# Patient Record
Sex: Male | Born: 1950
Health system: Southern US, Community
[De-identification: ages and names within clinical notes are randomized; demographics above are authoritative.]

## PROBLEM LIST (undated history)

## (undated) DIAGNOSIS — I10 Essential (primary) hypertension: Secondary | ICD-10-CM

## (undated) DIAGNOSIS — M51369 Other intervertebral disc degeneration, lumbar region without mention of lumbar back pain or lower extremity pain: Secondary | ICD-10-CM

## (undated) DIAGNOSIS — U071 COVID-19: Secondary | ICD-10-CM

## (undated) DIAGNOSIS — R519 Headache, unspecified: Secondary | ICD-10-CM

## (undated) DIAGNOSIS — H269 Unspecified cataract: Secondary | ICD-10-CM

## (undated) DIAGNOSIS — J869 Pyothorax without fistula: Secondary | ICD-10-CM

## (undated) DIAGNOSIS — C801 Malignant (primary) neoplasm, unspecified: Secondary | ICD-10-CM

## (undated) DIAGNOSIS — G4730 Sleep apnea, unspecified: Secondary | ICD-10-CM

## (undated) DIAGNOSIS — K589 Irritable bowel syndrome without diarrhea: Secondary | ICD-10-CM

## (undated) DIAGNOSIS — K5790 Diverticulosis of intestine, part unspecified, without perforation or abscess without bleeding: Secondary | ICD-10-CM

## (undated) DIAGNOSIS — R51 Headache: Secondary | ICD-10-CM

## (undated) DIAGNOSIS — M5136 Other intervertebral disc degeneration, lumbar region: Secondary | ICD-10-CM

## (undated) HISTORY — DX: Diverticulosis of intestine, part unspecified, without perforation or abscess without bleeding: K57.90

## (undated) HISTORY — DX: Unspecified cataract: H26.9

## (undated) HISTORY — DX: Other intervertebral disc degeneration, lumbar region: M51.36

## (undated) HISTORY — DX: Irritable bowel syndrome, unspecified: K58.9

## (undated) HISTORY — PX: COLONOSCOPY: SHX174

## (undated) HISTORY — DX: Malignant (primary) neoplasm, unspecified: C80.1

## (undated) HISTORY — DX: Sleep apnea, unspecified: G47.30

## (undated) HISTORY — DX: Headache: R51

## (undated) HISTORY — DX: Pyothorax without fistula: J86.9

## (undated) HISTORY — PX: POLYPECTOMY: SHX149

## (undated) HISTORY — PX: OTHER SURGICAL HISTORY: SHX169

## (undated) HISTORY — DX: Other intervertebral disc degeneration, lumbar region without mention of lumbar back pain or lower extremity pain: M51.369

## (undated) HISTORY — DX: Headache, unspecified: R51.9

## (undated) HISTORY — DX: Essential (primary) hypertension: I10

---

## 1983-07-23 HISTORY — PX: VASECTOMY: SHX75

## 1993-07-22 HISTORY — PX: COLON SURGERY: SHX602

## 2012-05-25 DIAGNOSIS — C44310 Basal cell carcinoma of skin of unspecified parts of face: Secondary | ICD-10-CM | POA: Insufficient documentation

## 2012-07-22 DIAGNOSIS — H269 Unspecified cataract: Secondary | ICD-10-CM

## 2012-07-22 HISTORY — DX: Unspecified cataract: H26.9

## 2012-07-22 HISTORY — PX: EYE SURGERY: SHX253

## 2013-07-22 HISTORY — PX: OTHER SURGICAL HISTORY: SHX169

## 2015-10-17 ENCOUNTER — Encounter: Payer: Self-pay | Admitting: Family Medicine

## 2015-10-17 DIAGNOSIS — I10 Essential (primary) hypertension: Secondary | ICD-10-CM | POA: Insufficient documentation

## 2015-10-17 DIAGNOSIS — H269 Unspecified cataract: Secondary | ICD-10-CM | POA: Insufficient documentation

## 2015-10-23 ENCOUNTER — Encounter: Payer: Self-pay | Admitting: Family Medicine

## 2015-10-23 ENCOUNTER — Ambulatory Visit (INDEPENDENT_AMBULATORY_CARE_PROVIDER_SITE_OTHER): Payer: BLUE CROSS/BLUE SHIELD | Admitting: Family Medicine

## 2015-10-23 VITALS — BP 130/78 | HR 86 | Temp 98.1°F | Resp 16 | Ht 73.0 in | Wt 275.0 lb

## 2015-10-23 DIAGNOSIS — Z0000 Encounter for general adult medical examination without abnormal findings: Secondary | ICD-10-CM

## 2015-10-23 DIAGNOSIS — Z23 Encounter for immunization: Secondary | ICD-10-CM | POA: Diagnosis not present

## 2015-10-23 DIAGNOSIS — K5790 Diverticulosis of intestine, part unspecified, without perforation or abscess without bleeding: Secondary | ICD-10-CM | POA: Insufficient documentation

## 2015-10-23 DIAGNOSIS — M51369 Other intervertebral disc degeneration, lumbar region without mention of lumbar back pain or lower extremity pain: Secondary | ICD-10-CM | POA: Insufficient documentation

## 2015-10-23 DIAGNOSIS — M5136 Other intervertebral disc degeneration, lumbar region: Secondary | ICD-10-CM | POA: Insufficient documentation

## 2015-10-23 LAB — CBC WITH DIFFERENTIAL/PLATELET
Basophils Absolute: 41 cells/uL (ref 0–200)
Basophils Relative: 1 %
Eosinophils Absolute: 82 cells/uL (ref 15–500)
Eosinophils Relative: 2 %
HEMATOCRIT: 48.1 % (ref 38.5–50.0)
HEMOGLOBIN: 16.3 g/dL (ref 13.0–17.0)
LYMPHS ABS: 1148 {cells}/uL (ref 850–3900)
Lymphocytes Relative: 28 %
MCH: 32.5 pg (ref 27.0–33.0)
MCHC: 33.9 g/dL (ref 32.0–36.0)
MCV: 95.8 fL (ref 80.0–100.0)
MONO ABS: 410 {cells}/uL (ref 200–950)
MPV: 9.5 fL (ref 7.5–12.5)
Monocytes Relative: 10 %
NEUTROS PCT: 59 %
Neutro Abs: 2419 cells/uL (ref 1500–7800)
Platelets: 155 10*3/uL (ref 140–400)
RBC: 5.02 MIL/uL (ref 4.20–5.80)
RDW: 13.8 % (ref 11.0–15.0)
WBC: 4.1 10*3/uL (ref 3.8–10.8)

## 2015-10-23 LAB — COMPLETE METABOLIC PANEL WITH GFR
ALBUMIN: 4.3 g/dL (ref 3.6–5.1)
ALK PHOS: 51 U/L (ref 40–115)
ALT: 20 U/L (ref 9–46)
AST: 18 U/L (ref 10–35)
BILIRUBIN TOTAL: 0.8 mg/dL (ref 0.2–1.2)
BUN: 15 mg/dL (ref 7–25)
CALCIUM: 9.2 mg/dL (ref 8.6–10.3)
CO2: 30 mmol/L (ref 20–31)
CREATININE: 0.9 mg/dL (ref 0.70–1.25)
Chloride: 103 mmol/L (ref 98–110)
GFR, EST NON AFRICAN AMERICAN: 88 mL/min (ref >=60)
Glucose, Bld: 93 mg/dL (ref 70–99)
Potassium: 5 mmol/L (ref 3.5–5.3)
Sodium: 141 mmol/L (ref 135–146)
TOTAL PROTEIN: 6.2 g/dL (ref 6.1–8.1)

## 2015-10-23 LAB — HEPATITIS C ANTIBODY: HCV Ab: NEGATIVE

## 2015-10-23 LAB — LIPID PANEL
Cholesterol: 165 mg/dL (ref 125–200)
HDL: 44 mg/dL (ref >=40)
LDL CALC: 91 mg/dL (ref <130)
Total CHOL/HDL Ratio: 3.8 Ratio (ref <=5.0)
Triglycerides: 149 mg/dL (ref <150)
VLDL: 30 mg/dL (ref <30)

## 2015-10-23 NOTE — Addendum Note (Signed)
Addended by: Shary Decamp B on: 10/23/2015 03:44 PM   Modules accepted: Orders

## 2015-10-23 NOTE — Progress Notes (Signed)
Subjective:    Patient ID: Ryan Turner, male    DOB: August 01, 1950, 65 y.o.   MRN: [id number]  HPI  Patient is here today to establish care. His only concern is some rectal leakage. However he takes Colace twice a day as a stool softener to prevent constipation because of his history of anal fissures. I recommended decreasing that to once a day and trying a fiber supplement as a bulking agent. Otherwise he is doing well. Immunizations are up-to-date: Immunization History  Administered Date(s) Administered  . Influenza-Unspecified 03/22/2015  . Pneumococcal Conjugate-13 01/21/2015  . Pneumococcal Polysaccharide-23 07/22/2013  . Zoster 07/22/2013   Patient is due for tetanus shot and will receive that today. Colonoscopy was performed 2 years ago and is normal. He does have a history of skin cancer but sees a dermatologist annually. He is due for prostate exam as well as a PSA. He is also due for hepatitis C screening. He has a history of borderline hypertension but his blood pressures well controlled today. He also has a history of obstructive sleep apnea. Past Medical History  Diagnosis Date  . Cataract 2014    removed  . Hypertension   . DDD (degenerative disc disease), lumbar   . IBS (irritable bowel syndrome)   . Diverticulosis    Past Surgical History  Procedure Laterality Date  . Colon surgery  1995    fistula  . Eye surgery  2014    catarats/lens replaced  . Vasectomy  1985  . Mohs  2015    skin cancer from nose   Current Outpatient Prescriptions on File Prior to Visit  Medication Sig Dispense Refill  . celecoxib (CELEBREX) 200 MG capsule Take 200 mg by mouth as needed.    . docusate sodium (COLACE) 100 MG capsule Take 200 mg by mouth daily.    . fluticasone (FLONASE) 50 MCG/ACT nasal spray Place into both nostrils as needed for allergies or rhinitis.    . Glucosamine-Chondroit-Vit C-Mn (GLUCOSAMINE CHONDROITIN COMPLX) CAPS Take 2 capsules by mouth daily. 1800 mg    .  hydrocortisone (PROCTOSOL HC) 2.5 % rectal cream Place 1 application rectally as needed for hemorrhoids or itching.    . Multiple Vitamins-Minerals (OCUVITE PO) Take 1 capsule by mouth daily.    . Omega-3 Fatty Acids (FISH OIL) 1200 MG CAPS Take 2 capsules by mouth daily.    Marland Kitchen zolpidem (AMBIEN) 10 MG tablet Take 5 mg by mouth at bedtime as needed for sleep.     No current facility-administered medications on file prior to visit.   No Known Allergies Social History   Social History  . Marital Status: Married    Spouse Name: N/A  . Number of Children: N/A  . Years of Education: N/A   Occupational History  . Not on file.   Social History Main Topics  . Smoking status: Current Some Day Smoker    Types: Cigars  . Smokeless tobacco: Never Used     Comment: quit cigarettes in 1973  . Alcohol Use: 3.6 oz/week    2 Cans of beer, 4 Shots of liquor per week  . Drug Use: No  . Sexual Activity: Yes    Birth Control/ Protection: None   Other Topics Concern  . Not on file   Social History Narrative   Family History  Problem Relation Age of Onset  . Cancer Mother     lung (unknown primary)  . Cancer Father     lung mets to spine  .  Cancer Brother     melanoma  . Leukemia Maternal Grandfather      Review of Systems  All other systems reviewed and are negative.      Objective:   Physical Exam  Constitutional: He is oriented to person, place, and time. He appears well-developed and well-nourished. No distress.  HENT:  Head: Normocephalic and atraumatic.  Right Ear: External ear normal.  Left Ear: External ear normal.  Nose: Nose normal.  Mouth/Throat: Oropharynx is clear and moist. No oropharyngeal exudate.  Eyes: Conjunctivae and EOM are normal. Pupils are equal, round, and reactive to light. Right eye exhibits no discharge. Left eye exhibits no discharge. No scleral icterus.  Neck: Normal range of motion. Neck supple. No JVD present. No tracheal deviation present. No  thyromegaly present.  Cardiovascular: Normal rate, regular rhythm and normal heart sounds.  Exam reveals no gallop and no friction rub.   No murmur heard. Pulmonary/Chest: Effort normal and breath sounds normal. No stridor. No respiratory distress. He has no wheezes. He has no rales. He exhibits no tenderness.  Abdominal: Soft. Bowel sounds are normal. He exhibits no distension and no mass. There is no tenderness. There is no rebound and no guarding.  Genitourinary: Rectum normal and prostate normal.  Musculoskeletal: Normal range of motion. He exhibits no edema or tenderness.  Lymphadenopathy:    He has no cervical adenopathy.  Neurological: He is alert and oriented to person, place, and time. He has normal reflexes. He displays normal reflexes. No cranial nerve deficit. He exhibits normal muscle tone. Coordination normal.  Skin: Skin is warm. No rash noted. He is not diaphoretic. No erythema. No pallor.  Psychiatric: He has a normal mood and affect. His behavior is normal. Judgment and thought content normal.  Vitals reviewed.         Assessment & Plan:  Routine general medical examination at a health care facility - Plan: CBC with Differential/Platelet, COMPLETE METABOLIC PANEL WITH GFR, Lipid panel, PSA, Hepatitis C Ab Reflex HCV RNA, QUANT  Physical exam is normal. I recommended against chewing cigars due to the risk of oral cancer. Colonoscopy is up-to-date. Prostate exam is normal. We will check a PSA. I will also screen the patient with a CBC, CMP, fasting lipid panel as well as a hepatitis C test. He received his tetanus shot today. Other immunizations are up-to-date. Regular anticipatory guidance is provided. I will recheck the patient in 6-12 months depending upon the results of his blood work

## 2015-10-24 ENCOUNTER — Encounter: Payer: Self-pay | Admitting: Family Medicine

## 2015-10-24 LAB — PSA: PSA: 0.5 ng/mL (ref <=4.00)

## 2016-09-23 ENCOUNTER — Ambulatory Visit: Payer: BLUE CROSS/BLUE SHIELD | Admitting: Family Medicine

## 2016-09-23 ENCOUNTER — Other Ambulatory Visit: Payer: Self-pay | Admitting: Family Medicine

## 2016-09-23 ENCOUNTER — Ambulatory Visit (INDEPENDENT_AMBULATORY_CARE_PROVIDER_SITE_OTHER): Payer: BLUE CROSS/BLUE SHIELD | Admitting: Family Medicine

## 2016-09-23 ENCOUNTER — Ambulatory Visit (HOSPITAL_COMMUNITY)
Admission: RE | Admit: 2016-09-23 | Discharge: 2016-09-23 | Disposition: A | Discharge status: Home / Self Care | Payer: BLUE CROSS/BLUE SHIELD | Source: Ambulatory Visit | Attending: Family Medicine | Admitting: Family Medicine

## 2016-09-23 ENCOUNTER — Encounter: Payer: Self-pay | Admitting: Family Medicine

## 2016-09-23 VITALS — BP 136/82 | HR 82 | Temp 98.2°F | Resp 20 | Ht 73.5 in | Wt 290.0 lb

## 2016-09-23 DIAGNOSIS — M25471 Effusion, right ankle: Secondary | ICD-10-CM | POA: Diagnosis present

## 2016-09-23 MED ORDER — ZOLPIDEM TARTRATE 10 MG PO TABS: 5.0000 mg | ORAL_TABLET | Freq: Every evening | ORAL | 2 refills | Status: DC | PRN

## 2016-09-23 MED ORDER — DICLOFENAC SODIUM 75 MG PO TBEC: 75.0000 mg | DELAYED_RELEASE_TABLET | Freq: Two times a day (BID) | ORAL | 2 refills | Status: DC

## 2016-09-23 NOTE — Progress Notes (Signed)
Subjective:    Patient ID: Ryan Turner, male    DOB: March 12, 1951, 66 y.o.   MRN: [id number]  HPI Patient presents with pain in his right ankle. Right ankle is extremely swollen. He has pitting edema to his knee. This began after a plane flight to [place]. He has +1 pitting edema in his right leg with pain along his right Achilles tendon. He has trace pitting edema in his left leg. He is tender to palpation of the Achilles tendon suggesting Achilles tendinitis but the swelling is out of proportion to what one would expect with Achilles tendinitis raising the concern for possible DVT. He also requests something he can use sparingly as needed for insomnia Past Medical History:  Diagnosis Date  . Cataract 2014   removed  . DDD (degenerative disc disease), lumbar   . Diverticulosis   . Hypertension   . IBS (irritable bowel syndrome)    Past Surgical History:  Procedure Laterality Date  . COLON SURGERY  1995   fistula  . EYE SURGERY  2014   catarats/lens replaced  . mohs  2015   skin cancer from nose  . VASECTOMY  1985   Current Outpatient Prescriptions on File Prior to Visit  Medication Sig Dispense Refill  . celecoxib (CELEBREX) 200 MG capsule Take 200 mg by mouth as needed.    . docusate sodium (COLACE) 100 MG capsule Take 200 mg by mouth daily.    . fluticasone (FLONASE) 50 MCG/ACT nasal spray Place into both nostrils as needed for allergies or rhinitis.    . Glucosamine-Chondroit-Vit C-Mn (GLUCOSAMINE CHONDROITIN COMPLX) CAPS Take 2 capsules by mouth daily. 1800 mg    . hydrocortisone (PROCTOSOL HC) 2.5 % rectal cream Place 1 application rectally as needed for hemorrhoids or itching.    . Omega-3 Fatty Acids (FISH OIL) 1200 MG CAPS Take 2 capsules by mouth daily.     No current facility-administered medications on file prior to visit.    Allergies  Allergen Reactions  . Morphine     Other reaction(s): VOMITING   Social History   Social History  . Marital status:  Married    Spouse name: N/A  . Number of children: N/A  . Years of education: N/A   Occupational History  . Not on file.   Social History Main Topics  . Smoking status: Current Some Day Smoker    Types: Cigars  . Smokeless tobacco: Never Used     Comment: quit cigarettes in 1973  . Alcohol use 3.6 oz/week    2 Cans of beer, 4 Shots of liquor per week  . Drug use: No  . Sexual activity: Yes    Birth control/ protection: None   Other Topics Concern  . Not on file   Social History Narrative  . No narrative on file      Review of Systems  All other systems reviewed and are negative.      Objective:   Physical Exam  Cardiovascular: Normal rate, regular rhythm and normal heart sounds.   Pulmonary/Chest: Effort normal and breath sounds normal.  Musculoskeletal: He exhibits edema.       Right lower leg: He exhibits tenderness, swelling and edema.       Right foot: There is tenderness, bony tenderness and swelling. There is no crepitus and no deformity.  Vitals reviewed.         Assessment & Plan:  Swelling of ankle joint, right - Plan: US Venous Img Lower Unilateral Right  Obtain venous ultrasound to rule out DVT in right leg. If there is no DVT, I will treat the patient with diclofenac 75 mg by mouth twice a day and he will cups. If there is a DVT, I will treat the DVT with xarelto.  I will give the patient Ambien 5-10 mg by mouth daily at bedtime when necessary insomnia. I gave him 30 tablets with 2 refills. This should be more than enough to last for a full year. We discussed the risk of addiction and habituation.  He would also like to try samples of Viagra for erectile dysfunction

## 2016-12-23 ENCOUNTER — Other Ambulatory Visit: Payer: Self-pay | Admitting: Family Medicine

## 2017-02-06 ENCOUNTER — Ambulatory Visit (INDEPENDENT_AMBULATORY_CARE_PROVIDER_SITE_OTHER): Payer: BLUE CROSS/BLUE SHIELD | Admitting: Family Medicine

## 2017-02-06 ENCOUNTER — Encounter: Payer: Self-pay | Admitting: Family Medicine

## 2017-02-06 VITALS — BP 146/100 | HR 78 | Temp 98.3°F | Resp 18 | Ht 73.5 in | Wt 285.0 lb

## 2017-02-06 DIAGNOSIS — M25471 Effusion, right ankle: Secondary | ICD-10-CM | POA: Diagnosis not present

## 2017-02-06 MED ORDER — ZOLPIDEM TARTRATE 10 MG PO TABS: 5.0000 mg | ORAL_TABLET | Freq: Every evening | ORAL | 2 refills | Status: DC | PRN

## 2017-02-06 NOTE — Progress Notes (Signed)
Subjective:    Patient ID: Ryan Turner, male    DOB: 1950/12/07, 66 y.o.   MRN: [id number]  HPI  09/23/16 Patient presents with pain in his right ankle. Right ankle is extremely swollen. He has pitting edema to his knee. This began after a plane flight to [place]. He has +1 pitting edema in his right leg with pain along his right Achilles tendon. He has trace pitting edema in his left leg. He is tender to palpation of the Achilles tendon suggesting Achilles tendinitis but the swelling is out of proportion to what one would expect with Achilles tendinitis raising the concern for possible DVT. He also requests something he can use sparingly as needed for insomnia.  At that time, my plan was: Obtain venous ultrasound to rule out DVT in right leg. If there is no DVT, I will treat the patient with diclofenac 75 mg by mouth twice a day and he will cups. If there is a DVT, I will treat the DVT with xarelto.  I will give the patient Ambien 5-10 mg by mouth daily at bedtime when necessary insomnia. I gave him 30 tablets with 2 refills. This should be more than enough to last for a full year. We discussed the risk of addiction and habituation.  He would also like to try samples of Viagra for erectile dysfunction  02/06/17 Ultrasound ruled out DVT. Patient began diclofenac 75 mg by mouth twice a day for tendinitis in his Achilles tendon. The pain in his Achilles tendon has gone away. He still has some mild tenderness to palpation near the insertion of the Achilles tendon on his posterior right calcaneus but this is much better than before. However he is still using diclofenac twice a day. Otherwise he denies any pain. However he still has edema in his right greater than left leg. It is worse at the end of the day. It is better in the morning. He has some small varicose veins on the distal right shin. He also has several small spider veins. All this leads me to believe that he is developed chronic venous  insufficiency Past Medical History:  Diagnosis Date  . Cataract 2014   removed  . DDD (degenerative disc disease), lumbar   . Diverticulosis   . Hypertension   . IBS (irritable bowel syndrome)    Past Surgical History:  Procedure Laterality Date  . COLON SURGERY  1995   fistula  . EYE SURGERY  2014   catarats/lens replaced  . mohs  2015   skin cancer from nose  . VASECTOMY  1985   Current Outpatient Prescriptions on File Prior to Visit  Medication Sig Dispense Refill  . diclofenac (VOLTAREN) 75 MG EC tablet TAKE 1 TABLET (75 MG TOTAL) BY MOUTH 2 (TWO) TIMES DAILY. 60 tablet 2  . docusate sodium (COLACE) 100 MG capsule Take 200 mg by mouth daily.    . fluticasone (FLONASE) 50 MCG/ACT nasal spray Place into both nostrils as needed for allergies or rhinitis.    . Glucosamine-Chondroit-Vit C-Mn (GLUCOSAMINE CHONDROITIN COMPLX) CAPS Take 2 capsules by mouth daily. 1800 mg    . hydrocortisone (PROCTOSOL HC) 2.5 % rectal cream Place 1 application rectally as needed for hemorrhoids or itching.    . Omega-3 Fatty Acids (FISH OIL) 1200 MG CAPS Take 2 capsules by mouth daily.    . celecoxib (CELEBREX) 200 MG capsule Take 200 mg by mouth as needed.     No current facility-administered medications on file prior  to visit.    Allergies  Allergen Reactions  . Morphine     Other reaction(s): VOMITING   Social History   Social History  . Marital status: Married    Spouse name: N/A  . Number of children: N/A  . Years of education: N/A   Occupational History  . Not on file.   Social History Main Topics  . Smoking status: Current Some Day Smoker    Types: Cigars  . Smokeless tobacco: Never Used     Comment: quit cigarettes in 1973  . Alcohol use 3.6 oz/week    2 Cans of beer, 4 Shots of liquor per week  . Drug use: No  . Sexual activity: Yes    Birth control/ protection: None   Other Topics Concern  . Not on file   Social History Narrative  . No narrative on file       Review of Systems  All other systems reviewed and are negative.      Objective:   Physical Exam  Cardiovascular: Normal rate, regular rhythm and normal heart sounds.   Pulmonary/Chest: Effort normal and breath sounds normal.  Musculoskeletal: He exhibits edema.       Right lower leg: He exhibits tenderness, swelling and edema.       Right foot: There is tenderness, bony tenderness and swelling. There is no crepitus and no deformity.  Vitals reviewed.         Assessment & Plan:  Swelling of ankle joint, right  Pain is much better. I believe he has chronic Achilles tendinitis in his right ankle. I recommended using diclofenac sparingly to avoid GI toxicity. If the pain worsens, proceed with an x-ray of the ankle. I believe the swelling in his leg is due to chronic venous insufficiency. I recommended using compression hose on a daily basis to help control this. He will check his blood pressure more regularly over the next 2-3 weeks. If consistently greater than 140/90, I will start him on medication for his blood pressure. Would consider using a diuretic to try to help with the swelling although he believes the hydrochlorothiazide caused constipation in the past

## 2017-02-20 ENCOUNTER — Encounter: Payer: Self-pay | Admitting: Family Medicine

## 2017-02-20 ENCOUNTER — Ambulatory Visit (INDEPENDENT_AMBULATORY_CARE_PROVIDER_SITE_OTHER): Payer: BLUE CROSS/BLUE SHIELD | Admitting: Family Medicine

## 2017-02-20 VITALS — BP 122/90 | HR 68 | Temp 98.5°F | Resp 18 | Ht 73.5 in | Wt 285.0 lb

## 2017-02-20 DIAGNOSIS — Z0000 Encounter for general adult medical examination without abnormal findings: Secondary | ICD-10-CM | POA: Diagnosis not present

## 2017-02-20 DIAGNOSIS — E6609 Other obesity due to excess calories: Secondary | ICD-10-CM

## 2017-02-20 DIAGNOSIS — Z125 Encounter for screening for malignant neoplasm of prostate: Secondary | ICD-10-CM | POA: Diagnosis not present

## 2017-02-20 LAB — COMPLETE METABOLIC PANEL WITH GFR
ALK PHOS: 62 U/L (ref 40–115)
ALT: 30 U/L (ref 9–46)
AST: 22 U/L (ref 10–35)
Albumin: 4.2 g/dL (ref 3.6–5.1)
BILIRUBIN TOTAL: 0.8 mg/dL (ref 0.2–1.2)
BUN: 17 mg/dL (ref 7–25)
CALCIUM: 9.3 mg/dL (ref 8.6–10.3)
CO2: 26 mmol/L (ref 20–31)
Chloride: 104 mmol/L (ref 98–110)
Creat: 1.03 mg/dL (ref 0.70–1.25)
GFR, EST AFRICAN AMERICAN: 86 mL/min (ref >=60)
GFR, EST NON AFRICAN AMERICAN: 74 mL/min (ref >=60)
Glucose, Bld: 88 mg/dL (ref 70–99)
Potassium: 4.8 mmol/L (ref 3.5–5.3)
Sodium: 142 mmol/L (ref 135–146)
TOTAL PROTEIN: 6.7 g/dL (ref 6.1–8.1)

## 2017-02-20 LAB — CBC WITH DIFFERENTIAL/PLATELET
BASOS ABS: 48 {cells}/uL (ref 0–200)
Basophils Relative: 1 %
EOS ABS: 96 {cells}/uL (ref 15–500)
Eosinophils Relative: 2 %
HCT: 48.4 % (ref 38.5–50.0)
HEMOGLOBIN: 16.6 g/dL (ref 13.0–17.0)
Lymphocytes Relative: 32 %
Lymphs Abs: 1536 cells/uL (ref 850–3900)
MCH: 33.6 pg — AB (ref 27.0–33.0)
MCHC: 34.3 g/dL (ref 32.0–36.0)
MCV: 98 fL (ref 80.0–100.0)
MPV: 9.5 fL (ref 7.5–12.5)
Monocytes Absolute: 432 cells/uL (ref 200–950)
Monocytes Relative: 9 %
NEUTROS ABS: 2688 {cells}/uL (ref 1500–7800)
Neutrophils Relative %: 56 %
Platelets: 184 10*3/uL (ref 140–400)
RBC: 4.94 MIL/uL (ref 4.20–5.80)
RDW: 14.1 % (ref 11.0–15.0)
WBC: 4.8 10*3/uL (ref 3.8–10.8)

## 2017-02-20 LAB — LIPID PANEL
Cholesterol: 204 mg/dL — ABNORMAL HIGH (ref <200)
HDL: 43 mg/dL (ref >40)
LDL Cholesterol: 129 mg/dL — ABNORMAL HIGH (ref <100)
Total CHOL/HDL Ratio: 4.7 Ratio (ref <5.0)
Triglycerides: 160 mg/dL — ABNORMAL HIGH (ref <150)
VLDL: 32 mg/dL — ABNORMAL HIGH (ref <30)

## 2017-02-20 NOTE — Progress Notes (Signed)
Subjective:    Patient ID: Ryan Turner, male    DOB: 06/29/1951, 66 y.o.   MRN: [id number]  HPI  Patient is here today for complete physical exam. His blood pressure today is much better than previous. It is 122/90. I have asked the patient to check his blood pressure frequently and monitor it at home. If the average is consistently greater than 140 on the top, or 90 on the bottom, I would increase indication control blood pressure. Today's blood pressure is reassuring. Immunizations are up-to-date as listed below. Colonoscopy is due again in 2020. He does have a history of colon polyps. He is due today for a prostate cancer screening. Hepatitis C screening is up-to-date Immunization History  Administered Date(s) Administered  . Influenza-Unspecified 03/22/2015  . Pneumococcal Conjugate-13 01/21/2015  . Pneumococcal Polysaccharide-23 07/22/2013  . Tdap 10/23/2015  . Zoster 07/22/2013    Past Medical History:  Diagnosis Date  . Cataract 2014   removed  . DDD (degenerative disc disease), lumbar   . Diverticulosis   . Hypertension   . IBS (irritable bowel syndrome)    Past Surgical History:  Procedure Laterality Date  . COLON SURGERY  1995   fistula  . EYE SURGERY  2014   catarats/lens replaced  . mohs  2015   skin cancer from nose  . VASECTOMY  1985   Current Outpatient Prescriptions on File Prior to Visit  Medication Sig Dispense Refill  . docusate sodium (COLACE) 100 MG capsule Take 200 mg by mouth daily.    . fluticasone (FLONASE) 50 MCG/ACT nasal spray Place into both nostrils as needed for allergies or rhinitis.    . Glucosamine-Chondroit-Vit C-Mn (GLUCOSAMINE CHONDROITIN COMPLX) CAPS Take 2 capsules by mouth daily. 1800 mg    . hydrocortisone (PROCTOSOL HC) 2.5 % rectal cream Place 1 application rectally as needed for hemorrhoids or itching.    . Omega-3 Fatty Acids (FISH OIL) 1200 MG CAPS Take 2 capsules by mouth daily.    Marland Kitchen zolpidem (AMBIEN) 10 MG tablet Take  0.5 tablets (5 mg total) by mouth at bedtime as needed for sleep. 30 tablet 2  . celecoxib (CELEBREX) 200 MG capsule Take 200 mg by mouth as needed.    . diclofenac (VOLTAREN) 75 MG EC tablet TAKE 1 TABLET (75 MG TOTAL) BY MOUTH 2 (TWO) TIMES DAILY. (Patient not taking: Reported on 02/20/2017) 60 tablet 2   No current facility-administered medications on file prior to visit.    Allergies  Allergen Reactions  . Morphine     Other reaction(s): VOMITING   Social History   Social History  . Marital status: Married    Spouse name: N/A  . Number of children: N/A  . Years of education: N/A   Occupational History  . Not on file.   Social History Main Topics  . Smoking status: Current Some Day Smoker    Types: Cigars  . Smokeless tobacco: Never Used     Comment: quit cigarettes in 1973  . Alcohol use 3.6 oz/week    2 Cans of beer, 4 Shots of liquor per week  . Drug use: No  . Sexual activity: Yes    Birth control/ protection: None   Other Topics Concern  . Not on file   Social History Narrative  . No narrative on file   Family History  Problem Relation Age of Onset  . Cancer Mother        lung (unknown primary)  . Cancer Father  lung mets to spine  . Cancer Brother        melanoma  . Leukemia Maternal Grandfather      Review of Systems  All other systems reviewed and are negative.      Objective:   Physical Exam  Constitutional: He is oriented to person, place, and time. He appears well-developed and well-nourished. No distress.  HENT:  Head: Normocephalic and atraumatic.  Right Ear: External ear normal.  Left Ear: External ear normal.  Nose: Nose normal.  Mouth/Throat: Oropharynx is clear and moist. No oropharyngeal exudate.  Eyes: Pupils are equal, round, and reactive to light. Conjunctivae and EOM are normal. Right eye exhibits no discharge. Left eye exhibits no discharge. No scleral icterus.  Neck: Normal range of motion. Neck supple. No JVD present.  No tracheal deviation present. No thyromegaly present.  Cardiovascular: Normal rate, regular rhythm and normal heart sounds.  Exam reveals no gallop and no friction rub.   No murmur heard. Pulmonary/Chest: Effort normal and breath sounds normal. No stridor. No respiratory distress. He has no wheezes. He has no rales. He exhibits no tenderness.  Abdominal: Soft. Bowel sounds are normal. He exhibits no distension and no mass. There is no tenderness. There is no rebound and no guarding.  Genitourinary: Rectum normal and prostate normal.  Musculoskeletal: Normal range of motion. He exhibits no edema or tenderness.  Lymphadenopathy:    He has no cervical adenopathy.  Neurological: He is alert and oriented to person, place, and time. He has normal reflexes. No cranial nerve deficit. He exhibits normal muscle tone. Coordination normal.  Skin: Skin is warm. No rash noted. He is not diaphoretic. No erythema. No pallor.  Psychiatric: He has a normal mood and affect. His behavior is normal. Judgment and thought content normal.  Vitals reviewed.         Assessment & Plan:  General medical exam - Plan: CBC with Differential/Platelet, COMPLETE METABOLIC PANEL WITH GFR, Lipid panel, PSA  Prostate cancer screening - Plan: PSA  Class 1 obesity due to excess calories without serious comorbidity in adult, unspecified BMI - Plan: CBC with Differential/Platelet, COMPLETE METABOLIC PANEL WITH GFR, Lipid panel, PSA  Physical exam today is remarkable only for borderline blood pressure, and elevated weight. Because of his elevated weight, I did recommend checking a CMP and fasting lipid panel. Continued asked the patient check his blood pressure and monitor the average. His average is greater than 140/90, we need to do a better job of controlling blood pressure. Weight loss would also help the patient with his blood pressure. Immunizations are up-to-date. Cancer screening is up-to-date. I will screen the patient  with a PSA although rectal exam today is completely normal.

## 2017-02-21 LAB — PSA: PSA: 0.3 ng/mL (ref <=4.0)

## 2017-02-24 ENCOUNTER — Encounter: Payer: Self-pay | Admitting: Family Medicine

## 2017-04-21 ENCOUNTER — Encounter: Payer: Self-pay | Admitting: Family Medicine

## 2017-04-21 ENCOUNTER — Ambulatory Visit (INDEPENDENT_AMBULATORY_CARE_PROVIDER_SITE_OTHER): Payer: BLUE CROSS/BLUE SHIELD | Admitting: Family Medicine

## 2017-04-21 VITALS — BP 142/86 | HR 91 | Temp 97.9°F | Resp 16 | Ht 73.0 in

## 2017-04-21 DIAGNOSIS — G43901 Migraine, unspecified, not intractable, with status migrainosus: Secondary | ICD-10-CM | POA: Diagnosis not present

## 2017-04-21 DIAGNOSIS — G4452 New daily persistent headache (NDPH): Secondary | ICD-10-CM | POA: Diagnosis not present

## 2017-04-21 MED ORDER — PREDNISONE 20 MG PO TABS: ORAL_TABLET | ORAL | 0 refills | Status: DC

## 2017-04-21 NOTE — Progress Notes (Signed)
Subjective:    Patient ID: Ryan Turner, male    DOB: [birth year], 66 y.o.   MRN: [id number]  HPI  Patient is a very pleasant 67 year old Caucasian male who presents with a gradually worsening bilateral headache located in both temples. It radiates to his occiput. It can be pulsatile. It can also be a tension-like and pressure-like in nature. There are no exacerbating or alleviating factors however it is steadily getting worse and worse over the last 2 weeks. He denies any head trauma. He denies any falls. He denies any concussions. He denies any neck stiffness. He denies any tick bites. He denies any fevers or chills. He denies any altered mental status. He denies any neurologic deficits. He denies any vision changes. The location of his headaches is more posterior than one would expect with temporal arteritis.He reports mild photophobia but denies phonophobia.  Denies nausea.  Denies vomiting.   Past Medical History:  Diagnosis Date  . Cataract 2014   removed  . DDD (degenerative disc disease), lumbar   . Diverticulosis   . Hypertension   . IBS (irritable bowel syndrome)    Past Surgical History:  Procedure Laterality Date  . COLON SURGERY  1995   fistula  . EYE SURGERY  2014   catarats/lens replaced  . mohs  2015   skin cancer from nose  . VASECTOMY  1985   Current Outpatient Prescriptions on File Prior to Visit  Medication Sig Dispense Refill  . celecoxib (CELEBREX) 200 MG capsule Take 200 mg by mouth as needed.    . docusate sodium (COLACE) 100 MG capsule Take 200 mg by mouth daily.    . fluticasone (FLONASE) 50 MCG/ACT nasal spray Place into both nostrils as needed for allergies or rhinitis.    . Glucosamine-Chondroit-Vit C-Mn (GLUCOSAMINE CHONDROITIN COMPLX) CAPS Take 2 capsules by mouth daily. 1800 mg    . hydrocortisone (PROCTOSOL HC) 2.5 % rectal cream Place 1 application rectally as needed for hemorrhoids or itching.    . Omega-3 Fatty Acids (FISH OIL) 1200 MG CAPS  Take 2 capsules by mouth daily.    Marland Kitchen zolpidem (AMBIEN) 10 MG tablet Take 0.5 tablets (5 mg total) by mouth at bedtime as needed for sleep. 30 tablet 2   No current facility-administered medications on file prior to visit.    Allergies  Allergen Reactions  . Morphine     Other reaction(s): VOMITING   Social History   Social History  . Marital status: Married    Spouse name: N/A  . Number of children: N/A  . Years of education: N/A   Occupational History  . Not on file.   Social History Main Topics  . Smoking status: Current Some Day Smoker    Types: Cigars  . Smokeless tobacco: Never Used     Comment: quit cigarettes in 1973  . Alcohol use 3.6 oz/week    2 Cans of beer, 4 Shots of liquor per week  . Drug use: No  . Sexual activity: Yes    Birth control/ protection: None   Other Topics Concern  . Not on file   Social History Narrative  . No narrative on file     Review of Systems  Neurological: Positive for headaches. Negative for dizziness, tremors, seizures, syncope, facial asymmetry, speech difficulty, weakness, light-headedness and numbness.       Objective:   Physical Exam  Constitutional: He is oriented to person, place, and time. He appears well-developed and well-nourished. No distress.  Eyes: Pupils are equal, round, and reactive to light. Conjunctivae and EOM are normal. Lids are everted and swept, no foreign bodies found.  Fundoscopic exam:      The right eye shows no papilledema.       The left eye shows no papilledema.  Neurological: He is oriented to person, place, and time. He has normal strength and normal reflexes. He displays no atrophy and no tremor. No cranial nerve deficit or sensory deficit. He exhibits normal muscle tone. He displays a negative Romberg sign. He displays no seizure activity. Coordination and gait normal. GCS eye subscore is 4. GCS verbal subscore is 5. GCS motor subscore is 6.  Skin: He is not diaphoretic.  Vitals  reviewed.         Assessment & Plan:  Status migrainosus - Plan: predniSONE (DELTASONE) 20 MG tablet  NPDH (new persistent daily headache) - Plan: MR Brain W Wo Contrast  New daily persistent headache  The patient's headache is nonspecific. The best guess is that he is having some type of status migrainosus given the photophobia and the pulsatile nature of some of his headache. However he never had a history of migraines nor family history of migraines prior to today. He denies any other concerning features other than the fact the headaches are gradually getting worse not better over the last 2 weeks. Therefore I believe is prudent that we proceed with imaging of the brain to evaluate for space-occupying lesion in the brain as a possible cause of his headaches. Meanwhile I will treat the patient for possible status migrainosus with a prednisone taper pack and reassess in one week or sooner if worse

## 2017-04-29 ENCOUNTER — Ambulatory Visit (INDEPENDENT_AMBULATORY_CARE_PROVIDER_SITE_OTHER): Payer: BLUE CROSS/BLUE SHIELD | Admitting: Family Medicine

## 2017-04-29 ENCOUNTER — Encounter: Payer: Self-pay | Admitting: Family Medicine

## 2017-04-29 VITALS — BP 140/82 | HR 74 | Temp 97.9°F | Resp 16 | Ht 73.0 in | Wt 287.0 lb

## 2017-04-29 DIAGNOSIS — G4452 New daily persistent headache (NDPH): Secondary | ICD-10-CM | POA: Diagnosis not present

## 2017-04-29 DIAGNOSIS — M316 Other giant cell arteritis: Secondary | ICD-10-CM | POA: Diagnosis not present

## 2017-04-29 LAB — COMPLETE METABOLIC PANEL WITH GFR
AG Ratio: 1.7 (calc) (ref 1.0–2.5)
ALT: 21 U/L (ref 9–46)
AST: 15 U/L (ref 10–35)
Albumin: 3.9 g/dL (ref 3.6–5.1)
Alkaline phosphatase (APISO): 68 U/L (ref 40–115)
BUN: 15 mg/dL (ref 7–25)
CALCIUM: 9 mg/dL (ref 8.6–10.3)
CO2: 29 mmol/L (ref 20–32)
CREATININE: 1 mg/dL (ref 0.70–1.25)
Chloride: 104 mmol/L (ref 98–110)
GFR, EST AFRICAN AMERICAN: 89 mL/min/{1.73_m2} (ref >=60)
GFR, EST NON AFRICAN AMERICAN: 77 mL/min/{1.73_m2} (ref >=60)
GLUCOSE: 93 mg/dL (ref 65–99)
Globulin: 2.3 g/dL (calc) (ref 1.9–3.7)
Potassium: 5.1 mmol/L (ref 3.5–5.3)
Sodium: 139 mmol/L (ref 135–146)
TOTAL PROTEIN: 6.2 g/dL (ref 6.1–8.1)
Total Bilirubin: 0.8 mg/dL (ref 0.2–1.2)

## 2017-04-29 LAB — CBC WITH DIFFERENTIAL/PLATELET
BASOS ABS: 38 {cells}/uL (ref 0–200)
Basophils Relative: 0.6 %
EOS ABS: 107 {cells}/uL (ref 15–500)
Eosinophils Relative: 1.7 %
HEMATOCRIT: 46.8 % (ref 38.5–50.0)
HEMOGLOBIN: 16.2 g/dL (ref 13.2–17.1)
LYMPHS ABS: 1733 {cells}/uL (ref 850–3900)
MCH: 32.4 pg (ref 27.0–33.0)
MCHC: 34.6 g/dL (ref 32.0–36.0)
MCV: 93.6 fL (ref 80.0–100.0)
MPV: 9.8 fL (ref 7.5–12.5)
Monocytes Relative: 9.2 %
NEUTROS ABS: 3843 {cells}/uL (ref 1500–7800)
Neutrophils Relative %: 61 %
Platelets: 208 10*3/uL (ref 140–400)
RBC: 5 10*6/uL (ref 4.20–5.80)
RDW: 12.4 % (ref 11.0–15.0)
Total Lymphocyte: 27.5 %
WBC mixed population: 580 cells/uL (ref 200–950)
WBC: 6.3 10*3/uL (ref 3.8–10.8)

## 2017-04-29 LAB — SEDIMENTATION RATE: Sed Rate: 6 mm/h (ref 0–20)

## 2017-04-29 MED ORDER — PREDNISONE 20 MG PO TABS
20.0000 mg | ORAL_TABLET | Freq: Every day | ORAL | 0 refills | Status: DC
Start: 2017-04-29 — End: 2017-07-07

## 2017-04-29 NOTE — Progress Notes (Signed)
Subjective:    Patient ID: Ryan Turner, male    DOB: [birth year], 66 y.o.   MRN: [id number]  Headache   Pertinent negatives include no dizziness, numbness, seizures or weakness.   04/21/17 Patient is a very pleasant 66 year old Caucasian male who presents with a gradually worsening bilateral headache located in both temples. It radiates to his occiput. It can be pulsatile. It can also be a tension-like and pressure-like in nature. There are no exacerbating or alleviating factors however it is steadily getting worse and worse over the last 2 weeks. He denies any head trauma. He denies any falls. He denies any concussions. He denies any neck stiffness. He denies any tick bites. He denies any fevers or chills. He denies any altered mental status. He denies any neurologic deficits. He denies any vision changes. The location of his headaches is more posterior than one would expect with temporal arteritis.He reports mild photophobia but denies phonophobia.  Denies nausea.  Denies vomiting.  At that time, my plan was: The patient's headache is nonspecific. The best guess is that he is having some type of status migrainosus given the photophobia and the pulsatile nature of some of his headache. However he never had a history of migraines nor family history of migraines prior to today. He denies any other concerning features other than the fact the headaches are gradually getting worse not better over the last 2 weeks. Therefore I believe is prudent that we proceed with imaging of the brain to evaluate for space-occupying lesion in the brain as a possible cause of his headaches. Meanwhile I will treat the patient for possible status migrainosus with a prednisone taper pack and  04/29/17 The patient's headache stopped abruptly on day 2 of prednisone. He remained completely pain-free throughout the duration of prednisone. After having completed the prednisone and being off the medication for 24 hours, the pain  has returned in his right temple. It is now located anterior and superior to his right ear near the temporal artery. The pain is constant. There is no vision changes. There are no other neurologic deficits but given the sudden and dramatic improvement on prednisone, and the return of pain immediately off prednisone, I am concerned about possible temporal arteritis as the patient canceled his MRI once he was pain-free Past Medical History:  Diagnosis Date  . Cataract 2014   removed  . DDD (degenerative disc disease), lumbar   . Diverticulosis   . Hypertension   . IBS (irritable bowel syndrome)    Past Surgical History:  Procedure Laterality Date  . COLON SURGERY  1995   fistula  . EYE SURGERY  2014   catarats/lens replaced  . mohs  2015   skin cancer from nose  . VASECTOMY  1985   Current Outpatient Prescriptions on File Prior to Visit  Medication Sig Dispense Refill  . celecoxib (CELEBREX) 200 MG capsule Take 200 mg by mouth as needed.    . docusate sodium (COLACE) 100 MG capsule Take 200 mg by mouth daily.    . fluticasone (FLONASE) 50 MCG/ACT nasal spray Place into both nostrils as needed for allergies or rhinitis.    . Glucosamine-Chondroit-Vit C-Mn (GLUCOSAMINE CHONDROITIN COMPLX) CAPS Take 2 capsules by mouth daily. 1800 mg    . hydrocortisone (PROCTOSOL HC) 2.5 % rectal cream Place 1 application rectally as needed for hemorrhoids or itching.    . Omega-3 Fatty Acids (FISH OIL) 1200 MG CAPS Take 2 capsules by mouth daily.    Marland Kitchen  zolpidem (AMBIEN) 10 MG tablet Take 0.5 tablets (5 mg total) by mouth at bedtime as needed for sleep. 30 tablet 2   No current facility-administered medications on file prior to visit.    Allergies  Allergen Reactions  . Morphine     Other reaction(s): VOMITING   Social History   Social History  . Marital status: Married    Spouse name: N/A  . Number of children: N/A  . Years of education: N/A   Occupational History  . Not on file.   Social  History Main Topics  . Smoking status: Current Some Day Smoker    Types: Cigars  . Smokeless tobacco: Never Used     Comment: quit cigarettes in 1973  . Alcohol use 3.6 oz/week    2 Cans of beer, 4 Shots of liquor per week  . Drug use: No  . Sexual activity: Yes    Birth control/ protection: None   Other Topics Concern  . Not on file   Social History Narrative  . No narrative on file     Review of Systems  Neurological: Positive for headaches. Negative for dizziness, tremors, seizures, syncope, facial asymmetry, speech difficulty, weakness, light-headedness and numbness.       Objective:   Physical Exam  Constitutional: He is oriented to person, place, and time. He appears well-developed and well-nourished. No distress.  Eyes: Pupils are equal, round, and reactive to light. Conjunctivae and EOM are normal. Lids are everted and swept, no foreign bodies found.  Fundoscopic exam:      The right eye shows no papilledema.       The left eye shows no papilledema.  Neurological: He is oriented to person, place, and time. He has normal strength and normal reflexes. He displays no atrophy and no tremor. No cranial nerve deficit or sensory deficit. He exhibits normal muscle tone. He displays a negative Romberg sign. He displays no seizure activity. Coordination and gait normal. GCS eye subscore is 4. GCS verbal subscore is 5. GCS motor subscore is 6.  Skin: He is not diaphoretic.  Vitals reviewed.         Assessment & Plan:  Temporal arteritis syndrome ([facility]) - Plan: Sedimentation rate, CBC with Differential/Platelet, COMPLETE METABOLIC PANEL WITH GFR  Resume prednisone 20 mg a day. Reassess on Friday. Schedule patient for neurology consultation. Check sedimentation rate. Also reschedule MRI

## 2017-05-06 ENCOUNTER — Telehealth: Payer: Self-pay

## 2017-05-06 NOTE — Telephone Encounter (Signed)
-----   Message from [name], MD sent at 05/01/2017  7:16 AM EDT ----- Labs nml.  How are headaches on prednisone again?

## 2017-05-06 NOTE — Telephone Encounter (Signed)
Patient states his headaches are better they are off and on he currently does not have a headache. Patient states he has a MRI sch for this Saturday. Patient states Neurology will not be able to see him until mid January. I will have Maudie Mercury see if he can get earlier appointment

## 2017-05-07 NOTE — Telephone Encounter (Signed)
Try to expedite neuro appt, and lets see what mri show Saturday.

## 2017-05-07 NOTE — Telephone Encounter (Signed)
Kim see note below  patient is not  able to be seen until mid January

## 2017-05-08 NOTE — Telephone Encounter (Signed)
Have rerouted to [facility], they have more providers and will be able to see her sooner.

## 2017-05-10 ENCOUNTER — Ambulatory Visit
Admission: RE | Admit: 2017-05-10 | Discharge: 2017-05-10 | Disposition: A | Discharge status: Home / Self Care | Payer: BLUE CROSS/BLUE SHIELD | Source: Ambulatory Visit | Attending: Family Medicine | Admitting: Family Medicine

## 2017-05-10 DIAGNOSIS — M316 Other giant cell arteritis: Secondary | ICD-10-CM

## 2017-05-10 DIAGNOSIS — G4452 New daily persistent headache (NDPH): Secondary | ICD-10-CM

## 2017-05-10 MED ORDER — GADOBENATE DIMEGLUMINE 529 MG/ML IV SOLN
20.0000 mL | Freq: Once | INTRAVENOUS | Status: AC | PRN
  Administered 2017-05-10: 20 mL via INTRAVENOUS

## 2017-05-12 ENCOUNTER — Encounter: Payer: Self-pay | Admitting: Family Medicine

## 2017-05-12 NOTE — Telephone Encounter (Signed)
Pt was made aware of MRI results.  Said Prednisone was not working well.  Per Dr Dennard Schaumann OK to stop taking.  See what Dr Krista Blue says on Thursday

## 2017-05-15 ENCOUNTER — Ambulatory Visit (INDEPENDENT_AMBULATORY_CARE_PROVIDER_SITE_OTHER): Payer: BLUE CROSS/BLUE SHIELD | Admitting: Neurology

## 2017-05-15 ENCOUNTER — Encounter: Payer: Self-pay | Admitting: Neurology

## 2017-05-15 VITALS — BP 147/86 | HR 83 | Ht 73.0 in | Wt 283.0 lb

## 2017-05-15 DIAGNOSIS — R51 Headache: Secondary | ICD-10-CM

## 2017-05-15 DIAGNOSIS — R519 Headache, unspecified: Secondary | ICD-10-CM

## 2017-05-15 MED ORDER — PROPRANOLOL HCL 60 MG PO TABS: 60.0000 mg | ORAL_TABLET | Freq: Two times a day (BID) | ORAL | 11 refills | Status: DC

## 2017-05-15 NOTE — Progress Notes (Signed)
PATIENT: Ryan Turner DOB: Apr 08, 1951  Chief Complaint  Patient presents with  . Headache/Temporal Arteritis Syndrome    He is here for further evaluation of daily headaches.  States temporal arteritis was suspected by his PCP and he was started on Prednisone.  He was taking 68m daily but has been instructed to start tapering his dose due to lack of improvement in pain.  He is now taking 176mdaily.  He had MRI brain on 05/10/17.  . Marland KitchenCP    PiSusy FrizzleMD     HISTORICAL  [name] a [age]ear old male, seen in refer by his primary care doctor PiJenna Luoor evaluation of headache, initial evaluation was on May 15 2017.  He denies a previous history of headache, without clear triggers, since September 2018, he began to experience daily mild to moderate pressure headaches, it is usually located at right parietal region, sometimes switched to the left parietal area, pressure pain, no light noise sensitivity, constant, he was treated with a round of prednisone at the beginning of October 2018, responded very well to 30 mg daily, but has recurrent headache after the prednisone dose was tapered off,  Laboratory evaluations showed normal CBC hemoglobin of 16.6, CMP, mild elevated LDL 129, normal ESR of 6,  He denies visual loss no jaw claudication, no limb achiness,  He is now given second round of prednisone dose, 20 mg daily for one week, did not help his headache   REVIEW OF SYSTEMS: Full 14 system review of systems performed and notable only for headache  ALLERGIES: Allergies  Allergen Reactions  . Morphine     Other reaction(s): VOMITING    HOME MEDICATIONS: Current Outpatient Prescriptions  Medication Sig Dispense Refill  . celecoxib (CELEBREX) 200 MG capsule Take 200 mg by mouth as needed.    . docusate sodium (COLACE) 100 MG capsule Take 200 mg by mouth daily.    . fluticasone (FLONASE) 50 MCG/ACT nasal spray Place into both nostrils as needed  for allergies or rhinitis.    . Glucosamine-Chondroit-Vit C-Mn (GLUCOSAMINE CHONDROITIN COMPLX) CAPS Take 2 capsules by mouth daily. 1800 mg    . hydrocortisone (PROCTOSOL HC) 2.5 % rectal cream Place 1 application rectally as needed for hemorrhoids or itching.    . Omega-3 Fatty Acids (FISH OIL) 1200 MG CAPS Take 2 capsules by mouth daily.    . predniSONE (DELTASONE) 20 MG tablet Take 1 tablet (20 mg total) by mouth daily with breakfast. 30 tablet 0  . zolpidem (AMBIEN) 10 MG tablet Take 0.5 tablets (5 mg total) by mouth at bedtime as needed for sleep. 30 tablet 2   No current facility-administered medications for this visit.     PAST MEDICAL HISTORY: Past Medical History:  Diagnosis Date  . Cataract 2014   removed  . DDD (degenerative disc disease), lumbar   . Diverticulosis   . Headache   . Hypertension   . IBS (irritable bowel syndrome)     PAST SURGICAL HISTORY: Past Surgical History:  Procedure Laterality Date  . COLON SURGERY  1995   fistula  . EYE SURGERY  2014   catarats/lens replaced  . mohs  2015   skin cancer from nose  . VASECTOMY  1985    FAMILY HISTORY: Family History  Problem Relation Age of Onset  . Cancer Mother        lung (unknown primary)  . Cancer Father        lung mets to spine  .  Cancer Brother        melanoma  . Leukemia Maternal Grandfather     SOCIAL HISTORY:  Social History   Social History  . Marital status: Married    Spouse name: Ryan Turner  . Number of children: Ryan Turner  . Years of education: College   Occupational History  . Broker    Social History Main Topics  . Smoking status: Current Some Day Smoker    Types: Cigars  . Smokeless tobacco: Never Used     Comment: quit cigarettes in 1973  . Alcohol use 3.6 oz/week    2 Cans of beer, 4 Shots of liquor per week  . Drug use: No  . Sexual activity: Yes    Birth control/ protection: None   Other Topics Concern  . Not on file   Social History Narrative   Lives at home with  wife.        PHYSICAL EXAM   Vitals:   05/15/17 0935  BP: (!) 147/86  Pulse: 83  Weight: 283 lb (128.4 kg)  Height: '6\' 1"'  (1.854 m)    Not recorded      Body mass index is 37.34 kg/m.  PHYSICAL EXAMNIATION:  Gen: NAD, conversant, well nourised, obese, well groomed                     Cardiovascular: Regular rate rhythm, no peripheral edema, warm, nontender. Eyes: Conjunctivae clear without exudates or hemorrhage Neck: Supple, no carotid bruits. Pulmonary: Clear to auscultation bilaterally   NEUROLOGICAL EXAM:  MENTAL STATUS: Speech:    Speech is normal; fluent and spontaneous with normal comprehension.  Cognition:     Orientation to time, place and person     Normal recent and remote memory     Normal Attention span and concentration     Normal Language, naming, repeating,spontaneous speech     Fund of knowledge   CRANIAL NERVES: CN II: Visual fields are full to confrontation. Fundoscopic exam is normal with sharp discs and no vascular changes. Pupils are round equal and briskly reactive to light. CN III, IV, VI: extraocular movement are normal. No ptosis. CN V: Facial sensation is intact to pinprick in all 3 divisions bilaterally. Corneal responses are intact.  CN VII: Face is symmetric with normal eye closure and smile. CN VIII: Hearing is normal to rubbing fingers CN IX, X: Palate elevates symmetrically. Phonation is normal. CN XI: Head turning and shoulder shrug are intact CN XII: Tongue is midline with normal movements and no atrophy.  MOTOR: There is no pronator drift of out-stretched arms. Muscle bulk and tone are normal. Muscle strength is normal.  REFLEXES: Reflexes are 2+ and symmetric at the biceps, triceps, knees, and ankles. Plantar responses are flexor.  SENSORY: Intact to light touch, pinprick, positional sensation and vibratory sensation are intact in fingers and toes.  COORDINATION: Rapid alternating movements and fine finger movements are  intact. There is no dysmetria on finger-to-nose and heel-knee-shin.    GAIT/STANCE: Posture is normal. Gait is steady with normal steps, base, arm swing, and turning. Heel and toe walking are normal. Tandem gait is normal.  Romberg is absent.   DIAGNOSTIC DATA (LABS, IMAGING, TESTING) - I reviewed patient records, labs, notes, testing and imaging myself where available.   ASSESSMENT AND PLAN  Astin Sayre is a 66 y.o. male   New onset persistent daily headache  Laboratory evaluations, including ESR C-reactive protein, thyroid function test  Inderal 60 mg twice a day  as headache prevention  NSAIDs as needed   Marcial Pacas, M.D. Ph.D.  [facility] [address] [place], [place] [address] Ph: [phone] Fax: [phone]  CC: [name], MD

## 2017-05-16 LAB — C-REACTIVE PROTEIN: CRP: 5.3 mg/L — AB (ref 0.0–4.9)

## 2017-05-16 LAB — VITAMIN B12: VITAMIN B 12: 337 pg/mL (ref 232–1245)

## 2017-05-16 LAB — SEDIMENTATION RATE: SED RATE: 2 mm/h (ref 0–30)

## 2017-05-16 LAB — TSH: TSH: 1.8 u[IU]/mL (ref 0.450–4.500)

## 2017-05-16 LAB — RPR: RPR: NONREACTIVE

## 2017-05-26 ENCOUNTER — Other Ambulatory Visit: Payer: Self-pay | Admitting: Family Medicine

## 2017-07-07 ENCOUNTER — Encounter: Payer: Self-pay | Admitting: Neurology

## 2017-07-07 ENCOUNTER — Ambulatory Visit (INDEPENDENT_AMBULATORY_CARE_PROVIDER_SITE_OTHER): Payer: BLUE CROSS/BLUE SHIELD | Admitting: Neurology

## 2017-07-07 VITALS — BP 136/83 | HR 65 | Ht 73.0 in | Wt 285.0 lb

## 2017-07-07 DIAGNOSIS — R51 Headache: Secondary | ICD-10-CM

## 2017-07-07 DIAGNOSIS — R519 Headache, unspecified: Secondary | ICD-10-CM

## 2017-07-07 MED ORDER — PROPRANOLOL HCL ER 80 MG PO CP24: 80.0000 mg | ORAL_CAPSULE | Freq: Every day | ORAL | 4 refills | Status: DC

## 2017-07-07 NOTE — Progress Notes (Signed)
PATIENT: Ryan Turner DOB: 10/20/1950  Chief Complaint  Patient presents with  . Persistent Headache    His headaches have greatly improved since starting Inderal.  However, with the start of this medication he has noticed mild leg weakness in the mornings and a runny nose.  He still has some blurred vision but this seems to come after working on the computer for long hours.     HISTORICAL  Ryan Turner is a 66 year old male, seen in refer by his primary care doctor Jenna Luo for evaluation of headache, initial evaluation was on May 15 2017.  He denies a previous history of headache, without clear triggers, since September 2018, he began to experience daily mild to moderate pressure headaches, it is usually located at right parietal region, sometimes switched to the left parietal area, pressure pain, no light noise sensitivity, constant, he was treated with a round of prednisone at the beginning of October 2018, responded very well to 30 mg daily, but has recurrent headache after the prednisone dose was tapered off,  Laboratory evaluations showed normal CBC hemoglobin of 16.6, CMP, mild elevated LDL 129, normal ESR of 6,  He denies visual loss no jaw claudication, no limb achiness,  He is now given second round of prednisone dose, 20 mg daily for one week, did not help his headache   UPDATE Jul 07 2017: I reviewed the laboratory in October 2018, C-reactive protein was slightly elevated at 5.3, normal ESR, RPR, B12, TSH.  MRI of brain in Oct 2018, generalized atrophy more than expected for his age 4 of lightheadedness with sudden positional change, Inderal 60 mg twice daily has been very helpful for his headache  REVIEW OF SYSTEMS: Full 14 system review of systems performed and notable only for blurred vision, runny nose, weakness  ALLERGIES: Allergies  Allergen Reactions  . Morphine     Other reaction(s): VOMITING    HOME MEDICATIONS: Current  Outpatient Medications  Medication Sig Dispense Refill  . celecoxib (CELEBREX) 200 MG capsule Take 200 mg by mouth as needed.    . docusate sodium (COLACE) 100 MG capsule Take 200 mg by mouth daily.    . fluticasone (FLONASE) 50 MCG/ACT nasal spray Place into both nostrils as needed for allergies or rhinitis.    . Glucosamine-Chondroit-Vit C-Mn (GLUCOSAMINE CHONDROITIN COMPLX) CAPS Take 2 capsules by mouth daily. 1800 mg    . hydrocortisone (PROCTOSOL HC) 2.5 % rectal cream Place 1 application rectally as needed for hemorrhoids or itching.    . Omega-3 Fatty Acids (FISH OIL) 1200 MG CAPS Take 2 capsules by mouth daily.    . propranolol (INDERAL) 60 MG tablet Take 1 tablet (60 mg total) by mouth 2 (two) times daily. 60 tablet 11  . zolpidem (AMBIEN) 10 MG tablet Take 0.5 tablets (5 mg total) by mouth at bedtime as needed for sleep. 30 tablet 2   No current facility-administered medications for this visit.     PAST MEDICAL HISTORY: Past Medical History:  Diagnosis Date  . Cataract 2014   removed  . DDD (degenerative disc disease), lumbar   . Diverticulosis   . Headache   . Hypertension   . IBS (irritable bowel syndrome)     PAST SURGICAL HISTORY: Past Surgical History:  Procedure Laterality Date  . COLON SURGERY  1995   fistula  . EYE SURGERY  2014   catarats/lens replaced  . mohs  2015   skin cancer from nose  . [place]  FAMILY HISTORY: Family History  Problem Relation Age of Onset  . Cancer Mother        lung (unknown primary)  . Cancer Father        lung mets to spine  . Cancer Brother        melanoma  . Leukemia Maternal Grandfather     SOCIAL HISTORY:  Social History   Socioeconomic History  . Marital status: Married    Spouse name: Not on file  . Number of children: Not on file  . Years of education: College  . Highest education level: Not on file  Social Needs  . Financial resource strain: Not on file  . Food insecurity - worry: Not on file   . Food insecurity - inability: Not on file  . Transportation needs - medical: Not on file  . Transportation needs - non-medical: Not on file  Occupational History  . Occupation: Teacher, music  Tobacco Use  . Smoking status: Current Some Day Smoker    Types: Cigars  . Smokeless tobacco: Never Used  . Tobacco comment: quit cigarettes in 1973  Substance and Sexual Activity  . Alcohol use: Yes    Alcohol/week: 3.6 oz    Types: 2 Cans of beer, 4 Shots of liquor per week  . Drug use: No  . Sexual activity: Yes    Birth control/protection: None  Other Topics Concern  . Not on file  Social History Narrative   Lives at home with wife.     PHYSICAL EXAM   Vitals:   07/07/17 0735  BP: 136/83  Pulse: 65  Weight: 285 lb (129.3 kg)  Height: '6\' 1"'  (1.854 m)   Blood pressure lying down 142/89, heart rate of 60, standing up 1 minute 135/83 heart rate of 62, standing up 3 minutes 126/85, 64  Body mass index is 37.6 kg/m.  PHYSICAL EXAMNIATION:  Gen: NAD, conversant, well nourised, obese, well groomed                     Cardiovascular: Regular rate rhythm, no peripheral edema, warm, nontender. Eyes: Conjunctivae clear without exudates or hemorrhage Neck: Supple, no carotid bruits. Pulmonary: Clear to auscultation bilaterally   NEUROLOGICAL EXAM:  MENTAL STATUS: Speech:    Speech is normal; fluent and spontaneous with normal comprehension.  Cognition:     Orientation to time, place and person     Normal recent and remote memory     Normal Attention span and concentration     Normal Language, naming, repeating,spontaneous speech     Fund of knowledge   CRANIAL NERVES: CN II: Visual fields are full to confrontation. Fundoscopic exam is normal with sharp discs and no vascular changes. Pupils are round equal and briskly reactive to light. CN III, IV, VI: extraocular movement are normal. No ptosis. CN V: Facial sensation is intact to pinprick in all 3 divisions bilaterally. Corneal  responses are intact.  CN VII: Face is symmetric with normal eye closure and smile. CN VIII: Hearing is normal to rubbing fingers CN IX, X: Palate elevates symmetrically. Phonation is normal. CN XI: Head turning and shoulder shrug are intact CN XII: Tongue is midline with normal movements and no atrophy.  MOTOR: There is no pronator drift of out-stretched arms. Muscle bulk and tone are normal. Muscle strength is normal.  REFLEXES: Reflexes are 2+ and symmetric at the biceps, triceps, knees, and ankles. Plantar responses are flexor.  SENSORY: Intact to light touch, pinprick, positional sensation and  vibratory sensation are intact in fingers and toes.  COORDINATION: Rapid alternating movements and fine finger movements are intact. There is no dysmetria on finger-to-nose and heel-knee-shin.    GAIT/STANCE: Posture is normal. Gait is steady with normal steps, base, arm swing, and turning. Heel and toe walking are normal. Tandem gait is normal.  Romberg is absent.   DIAGNOSTIC DATA (LABS, IMAGING, TESTING) - I reviewed patient records, labs, notes, testing and imaging myself where available.   ASSESSMENT AND PLAN  Ryan Turner is a 66 y.o. male   New onset persistent daily headache  Laboratory evaluations, no significant abnormality on ESR C-reactive protein  Inderal has been very helpful, continue Inderal ER 80 mg every night  Encouraged him to moderate exercise,   Marcial Pacas, M.D. Ph.D.  [facility] [address], [place], [place] [address] Ph: [phone] Fax: [phone]  CC: [name], MD

## 2017-07-24 ENCOUNTER — Telehealth: Payer: Self-pay | Admitting: Family Medicine

## 2017-07-24 NOTE — Telephone Encounter (Signed)
I don't think that the propranolol is causing stiffness in his knees and hips and legs. It shouldn't do that. He can certainly try stopping it for a few days. If the stiffness goes away, we can try switching him to topamax to prevent headaches.  If the stiffness doesn't go away, then I would want to see him to determine why he I stiff and then he could resume propranolol because it was not causing the stiffness.

## 2017-07-24 NOTE — Telephone Encounter (Signed)
Pt called and states that he went to see neuro and she put him on propranolol 120mg  and he started having stiffness /pain in his knees, hips and legs. Neuro reduced it to 80 mg and referred him back to [place]. He is calling stating that his HA's are much improved but he would rather have the HA's then the pain and stiffness in his knees, etc. He did try to get an apt with [place] last week but we were full and he is now in [place] working. Would like to know what you would recommend for him to do now?

## 2017-07-25 NOTE — Telephone Encounter (Signed)
Patient aware of results and of providers recommendations and will call us back to let us know how he is doing.

## 2017-08-06 ENCOUNTER — Telehealth: Payer: Self-pay | Admitting: Family Medicine

## 2017-08-06 MED ORDER — CELECOXIB 200 MG PO CAPS: 200.0000 mg | ORAL_CAPSULE | ORAL | 1 refills | Status: DC | PRN

## 2017-08-06 NOTE — Telephone Encounter (Signed)
Pt called and states that since stopping the propanolol his joint pain has not gotten any better and is wondering if he should restart this or not?? He is out of town and did make an apt for when he comes back to see you in March.

## 2017-08-07 NOTE — Telephone Encounter (Signed)
Pt aware of recommendations

## 2017-08-07 NOTE — Telephone Encounter (Signed)
Yes I would it was helping headache and it was not causing joint pain.

## 2017-08-18 ENCOUNTER — Other Ambulatory Visit: Payer: Self-pay | Admitting: Family Medicine

## 2017-08-18 NOTE — Telephone Encounter (Signed)
Ok to refill??  Last office visit 04/29/2017.  Last refill 02/06/2017, #2 refills.

## 2017-08-25 ENCOUNTER — Telehealth: Payer: Self-pay | Admitting: Family Medicine

## 2017-08-25 ENCOUNTER — Other Ambulatory Visit: Payer: Self-pay | Admitting: Family Medicine

## 2017-08-25 MED ORDER — ZOLPIDEM TARTRATE 10 MG PO TABS: ORAL_TABLET | ORAL | 0 refills | Status: DC

## 2017-08-25 NOTE — Telephone Encounter (Signed)
Pt is in [place] and pharmacy is requesting we send a 30 day supply of Ambien to the [facility] in [place] as he is down there on vacation. Please send if ok.

## 2017-09-22 ENCOUNTER — Encounter: Payer: Self-pay | Admitting: Family Medicine

## 2017-09-22 ENCOUNTER — Ambulatory Visit (INDEPENDENT_AMBULATORY_CARE_PROVIDER_SITE_OTHER): Payer: BLUE CROSS/BLUE SHIELD | Admitting: Family Medicine

## 2017-09-22 VITALS — BP 152/90 | HR 68 | Temp 98.0°F | Resp 16 | Ht 73.5 in | Wt 288.0 lb

## 2017-09-22 DIAGNOSIS — M2550 Pain in unspecified joint: Secondary | ICD-10-CM

## 2017-09-22 MED ORDER — ZOLPIDEM TARTRATE 10 MG PO TABS: ORAL_TABLET | ORAL | 3 refills | Status: DC

## 2017-09-22 NOTE — Progress Notes (Signed)
Subjective:    Patient ID: Ryan Turner, male    DOB: February 04, 1951, 67 y.o.   MRN: [id number]  Headache   Pertinent negatives include no dizziness, numbness, seizures or weakness.   04/21/17 Patient is a very pleasant 67 year old Caucasian male who presents with a gradually worsening bilateral headache located in both temples. It radiates to his occiput. It can be pulsatile. It can also be a tension-like and pressure-like in nature. There are no exacerbating or alleviating factors however it is steadily getting worse and worse over the last 2 weeks. He denies any head trauma. He denies any falls. He denies any concussions. He denies any neck stiffness. He denies any tick bites. He denies any fevers or chills. He denies any altered mental status. He denies any neurologic deficits. He denies any vision changes. The location of his headaches is more posterior than one would expect with temporal arteritis.He reports mild photophobia but denies phonophobia.  Denies nausea.  Denies vomiting.  At that time, my plan was: The patient's headache is nonspecific. The best guess is that he is having some type of status migrainosus given the photophobia and the pulsatile nature of some of his headache. However he never had a history of migraines nor family history of migraines prior to today. He denies any other concerning features other than the fact the headaches are gradually getting worse not better over the last 2 weeks. Therefore I believe is prudent that we proceed with imaging of the brain to evaluate for space-occupying lesion in the brain as a possible cause of his headaches. Meanwhile I will treat the patient for possible status migrainosus with a prednisone taper pack and  04/29/17 The patient's headache stopped abruptly on day 2 of prednisone. He remained completely pain-free throughout the duration of prednisone. After having completed the prednisone and being off the medication for 24 hours, the pain  has returned in his right temple. It is now located anterior and superior to his right ear near the temporal artery. The pain is constant. There is no vision changes. There are no other neurologic deficits but given the sudden and dramatic improvement on prednisone, and the return of pain immediately off prednisone, I am concerned about possible temporal arteritis as the patient canceled his MRI once he was pain-free.  At that time, plan was: Resume prednisone 20 mg a day. Reassess on Friday. Schedule patient for neurology consultation. Check sedimentation rate. Also reschedule MRI  09/22/17 Patient ultimately saw neurology who found no abnormalities to explain his headache and treated him for atypical migraines with propranolol on a daily basis.  Propranolol seemed to stop the patient's headaches.  However suddenly after starting the propranolol, the patient reports joint pains on a daily basis.  They primarily involve the knees bilaterally, the anterior thighs bilaterally, both shoulders.  He reports sharp pain with standing walking or initial movement.  However the pain quickly subsides as soon as he begins moving.  Symptoms sound more consistent with arthritis or generalized muscle pain.  Patient states that the worst joint is probably his right knee. Past Medical History:  Diagnosis Date  . Cataract 2014   removed  . DDD (degenerative disc disease), lumbar   . Diverticulosis   . Headache   . Hypertension   . IBS (irritable bowel syndrome)    Past Surgical History:  Procedure Laterality Date  . COLON SURGERY  1995   fistula  . EYE SURGERY  2014   catarats/lens replaced  .  mohs  2015   skin cancer from nose  . VASECTOMY  1985   Current Outpatient Medications on File Prior to Visit  Medication Sig Dispense Refill  . celecoxib (CELEBREX) 200 MG capsule Take 1 capsule (200 mg total) by mouth as needed. 90 capsule 1  . docusate sodium (COLACE) 100 MG capsule Take 200 mg by mouth daily.    .  fluticasone (FLONASE) 50 MCG/ACT nasal spray Place into both nostrils as needed for allergies or rhinitis.    . Glucosamine-Chondroit-Vit C-Mn (GLUCOSAMINE CHONDROITIN COMPLX) CAPS Take 2 capsules by mouth daily. 1800 mg    . hydrocortisone (PROCTOSOL HC) 2.5 % rectal cream Place 1 application rectally as needed for hemorrhoids or itching.    . Omega-3 Fatty Acids (FISH OIL) 1200 MG CAPS Take 2 capsules by mouth daily.    . propranolol ER (INDERAL LA) 80 MG 24 hr capsule Take 1 capsule (80 mg total) by mouth daily. 90 capsule 4   No current facility-administered medications on file prior to visit.    Allergies  Allergen Reactions  . Morphine     Other reaction(s): VOMITING   Social History   Socioeconomic History  . Marital status: Married    Spouse name: Not on file  . Number of children: Not on file  . Years of education: College  . Highest education level: Not on file  Social Needs  . Financial resource strain: Not on file  . Food insecurity - worry: Not on file  . Food insecurity - inability: Not on file  . Transportation needs - medical: Not on file  . Transportation needs - non-medical: Not on file  Occupational History  . Occupation: Teacher, music  Tobacco Use  . Smoking status: Current Some Day Smoker    Types: Cigars  . Smokeless tobacco: Never Used  . Tobacco comment: quit cigarettes in 1973  Substance and Sexual Activity  . Alcohol use: Yes    Alcohol/week: 3.6 oz    Types: 2 Cans of beer, 4 Shots of liquor per week  . Drug use: No  . Sexual activity: Yes    Birth control/protection: None  Other Topics Concern  . Not on file  Social History Narrative   Lives at home with wife.     Review of Systems  Neurological: Positive for headaches. Negative for dizziness, tremors, seizures, syncope, facial asymmetry, speech difficulty, weakness, light-headedness and numbness.       Objective:   Physical Exam  Constitutional: He is oriented to person, place, and time. He  appears well-developed and well-nourished. No distress.  Eyes: Lids are everted and swept, no foreign bodies found.  Cardiovascular: Normal rate, regular rhythm and normal heart sounds.  Pulmonary/Chest: Effort normal and breath sounds normal. No respiratory distress. He has no wheezes. He has no rales.  Musculoskeletal: He exhibits no edema or deformity.  Neurological: He is alert and oriented to person, place, and time.  Skin: He is not diaphoretic.  Vitals reviewed.         Assessment & Plan:  Polyarthralgia - Plan: Sedimentation rate, ANA, Rheumatoid factor, B. burgdorfi antibodies by WB, CK, CBC with Differential/Platelet, COMPLETE METABOLIC PANEL WITH GFR, DG Knee Complete 4 Views Right  The majority of his symptoms sound like generalized aches and pains that one would anticipate with age.  However symptoms seem to began suddenly.  I recommended an x-ray of the right knee given that this is his worst joint to evaluate for any skeletal lesion such as  lytic lesions or evidence of bone cancer as well as to confirm osteoarthritis.  Also recommended checking a CBC to evaluate for any bone marrow abnormalities, checking a CMP to evaluate for any evidence of kidney problems or elevated total protein in the blood suggesting multiple myeloma.  I recommended checking an ANA as well as a rheumatoid factor to evaluate for any signs of autoimmune diseases.  I also recommended checking a CK to evaluate for myositis.  I will check a sed rate to evaluate for autoimmune diseases as well as PMR.  Await the results of lab work and x-rays prior to determining the next course of action.  Literature search does report myopathy in patients taking propranolol however this is extremely rare.  Furthermore the patient discontinue the medication for 2 weeks and saw no benefit.

## 2017-09-24 LAB — COMPLETE METABOLIC PANEL WITH GFR
AG Ratio: 1.9 (calc) (ref 1.0–2.5)
ALT: 22 U/L (ref 9–46)
AST: 17 U/L (ref 10–35)
Albumin: 4.1 g/dL (ref 3.6–5.1)
Alkaline phosphatase (APISO): 55 U/L (ref 40–115)
BUN: 13 mg/dL (ref 7–25)
CALCIUM: 9.3 mg/dL (ref 8.6–10.3)
CHLORIDE: 103 mmol/L (ref 98–110)
CO2: 28 mmol/L (ref 20–32)
CREATININE: 1.03 mg/dL (ref 0.70–1.25)
GFR, EST AFRICAN AMERICAN: 85 mL/min/{1.73_m2} (ref >=60)
GFR, EST NON AFRICAN AMERICAN: 74 mL/min/{1.73_m2} (ref >=60)
GLUCOSE: 94 mg/dL (ref 65–99)
Globulin: 2.2 g/dL (calc) (ref 1.9–3.7)
Potassium: 5.1 mmol/L (ref 3.5–5.3)
Sodium: 140 mmol/L (ref 135–146)
TOTAL PROTEIN: 6.3 g/dL (ref 6.1–8.1)
Total Bilirubin: 0.7 mg/dL (ref 0.2–1.2)

## 2017-09-24 LAB — B. BURGDORFI ANTIBODIES BY WB

## 2017-09-24 LAB — CBC WITH DIFFERENTIAL/PLATELET
BASOS PCT: 1 %
Basophils Absolute: 48 cells/uL (ref 0–200)
EOS PCT: 5.4 %
Eosinophils Absolute: 259 cells/uL (ref 15–500)
HCT: 45.7 % (ref 38.5–50.0)
HEMOGLOBIN: 15.8 g/dL (ref 13.2–17.1)
Lymphs Abs: 1291 cells/uL (ref 850–3900)
MCH: 32.2 pg (ref 27.0–33.0)
MCHC: 34.6 g/dL (ref 32.0–36.0)
MCV: 93.1 fL (ref 80.0–100.0)
MONOS PCT: 7.7 %
MPV: 9.7 fL (ref 7.5–12.5)
NEUTROS ABS: 2832 {cells}/uL (ref 1500–7800)
Neutrophils Relative %: 59 %
PLATELETS: 167 10*3/uL (ref 140–400)
RBC: 4.91 10*6/uL (ref 4.20–5.80)
RDW: 12.8 % (ref 11.0–15.0)
TOTAL LYMPHOCYTE: 26.9 %
WBC mixed population: 370 cells/uL (ref 200–950)
WBC: 4.8 10*3/uL (ref 3.8–10.8)

## 2017-09-24 LAB — CK: Total CK: 62 U/L (ref 44–196)

## 2017-09-24 LAB — RHEUMATOID FACTOR: Rheumatoid fact SerPl-aCnc: <14 [IU]/mL

## 2017-09-24 LAB — ANA: Anti Nuclear Antibody(ANA): POSITIVE — AB

## 2017-09-24 LAB — ANTI-NUCLEAR AB-TITER (ANA TITER): ANA Titer 1: 1:40 {titer} — ABNORMAL HIGH

## 2017-09-24 LAB — SEDIMENTATION RATE: Sed Rate: 2 mm/h (ref 0–20)

## 2017-09-25 ENCOUNTER — Encounter: Payer: Self-pay | Admitting: Family Medicine

## 2017-10-03 ENCOUNTER — Encounter: Payer: Self-pay | Admitting: Family Medicine

## 2017-10-03 ENCOUNTER — Ambulatory Visit (HOSPITAL_COMMUNITY)
Admission: RE | Admit: 2017-10-03 | Discharge: 2017-10-03 | Disposition: A | Discharge status: Home / Self Care | Payer: BLUE CROSS/BLUE SHIELD | Source: Ambulatory Visit | Attending: Family Medicine | Admitting: Family Medicine

## 2017-10-03 DIAGNOSIS — M25561 Pain in right knee: Secondary | ICD-10-CM | POA: Insufficient documentation

## 2017-10-03 DIAGNOSIS — M2550 Pain in unspecified joint: Secondary | ICD-10-CM

## 2017-10-09 MED ORDER — MELOXICAM 15 MG PO TABS: 15.0000 mg | ORAL_TABLET | Freq: Every day | ORAL | 0 refills | Status: DC

## 2017-11-04 DIAGNOSIS — L570 Actinic keratosis: Secondary | ICD-10-CM | POA: Diagnosis not present

## 2017-11-04 DIAGNOSIS — L853 Xerosis cutis: Secondary | ICD-10-CM | POA: Diagnosis not present

## 2017-11-06 ENCOUNTER — Other Ambulatory Visit: Payer: Self-pay | Admitting: Family Medicine

## 2018-01-15 ENCOUNTER — Other Ambulatory Visit: Payer: Self-pay | Admitting: Family Medicine

## 2018-01-15 MED ORDER — CELECOXIB 200 MG PO CAPS: 200.0000 mg | ORAL_CAPSULE | ORAL | 1 refills | Status: DC | PRN

## 2018-02-10 DIAGNOSIS — M545 Low back pain: Secondary | ICD-10-CM | POA: Diagnosis not present

## 2018-02-10 DIAGNOSIS — M62830 Muscle spasm of back: Secondary | ICD-10-CM | POA: Diagnosis not present

## 2018-02-10 DIAGNOSIS — M9903 Segmental and somatic dysfunction of lumbar region: Secondary | ICD-10-CM | POA: Diagnosis not present

## 2018-02-10 DIAGNOSIS — M5136 Other intervertebral disc degeneration, lumbar region: Secondary | ICD-10-CM | POA: Diagnosis not present

## 2018-02-26 DIAGNOSIS — M5136 Other intervertebral disc degeneration, lumbar region: Secondary | ICD-10-CM | POA: Diagnosis not present

## 2018-02-26 DIAGNOSIS — M545 Low back pain: Secondary | ICD-10-CM | POA: Diagnosis not present

## 2018-02-26 DIAGNOSIS — M62830 Muscle spasm of back: Secondary | ICD-10-CM | POA: Diagnosis not present

## 2018-02-26 DIAGNOSIS — M9903 Segmental and somatic dysfunction of lumbar region: Secondary | ICD-10-CM | POA: Diagnosis not present

## 2018-03-02 DIAGNOSIS — M5136 Other intervertebral disc degeneration, lumbar region: Secondary | ICD-10-CM | POA: Diagnosis not present

## 2018-03-02 DIAGNOSIS — M62830 Muscle spasm of back: Secondary | ICD-10-CM | POA: Diagnosis not present

## 2018-03-02 DIAGNOSIS — M545 Low back pain: Secondary | ICD-10-CM | POA: Diagnosis not present

## 2018-03-02 DIAGNOSIS — M9903 Segmental and somatic dysfunction of lumbar region: Secondary | ICD-10-CM | POA: Diagnosis not present

## 2018-03-03 DIAGNOSIS — M9903 Segmental and somatic dysfunction of lumbar region: Secondary | ICD-10-CM | POA: Diagnosis not present

## 2018-03-03 DIAGNOSIS — M62830 Muscle spasm of back: Secondary | ICD-10-CM | POA: Diagnosis not present

## 2018-03-03 DIAGNOSIS — M5136 Other intervertebral disc degeneration, lumbar region: Secondary | ICD-10-CM | POA: Diagnosis not present

## 2018-03-03 DIAGNOSIS — M545 Low back pain: Secondary | ICD-10-CM | POA: Diagnosis not present

## 2018-03-05 DIAGNOSIS — M5136 Other intervertebral disc degeneration, lumbar region: Secondary | ICD-10-CM | POA: Diagnosis not present

## 2018-03-05 DIAGNOSIS — M9903 Segmental and somatic dysfunction of lumbar region: Secondary | ICD-10-CM | POA: Diagnosis not present

## 2018-03-05 DIAGNOSIS — M62830 Muscle spasm of back: Secondary | ICD-10-CM | POA: Diagnosis not present

## 2018-03-05 DIAGNOSIS — M545 Low back pain: Secondary | ICD-10-CM | POA: Diagnosis not present

## 2018-03-09 DIAGNOSIS — M545 Low back pain: Secondary | ICD-10-CM | POA: Diagnosis not present

## 2018-03-09 DIAGNOSIS — M5136 Other intervertebral disc degeneration, lumbar region: Secondary | ICD-10-CM | POA: Diagnosis not present

## 2018-03-09 DIAGNOSIS — M62830 Muscle spasm of back: Secondary | ICD-10-CM | POA: Diagnosis not present

## 2018-03-09 DIAGNOSIS — M9903 Segmental and somatic dysfunction of lumbar region: Secondary | ICD-10-CM | POA: Diagnosis not present

## 2018-03-10 DIAGNOSIS — M62830 Muscle spasm of back: Secondary | ICD-10-CM | POA: Diagnosis not present

## 2018-03-10 DIAGNOSIS — M9903 Segmental and somatic dysfunction of lumbar region: Secondary | ICD-10-CM | POA: Diagnosis not present

## 2018-03-10 DIAGNOSIS — M545 Low back pain: Secondary | ICD-10-CM | POA: Diagnosis not present

## 2018-03-10 DIAGNOSIS — M5136 Other intervertebral disc degeneration, lumbar region: Secondary | ICD-10-CM | POA: Diagnosis not present

## 2018-03-12 DIAGNOSIS — M545 Low back pain: Secondary | ICD-10-CM | POA: Diagnosis not present

## 2018-03-12 DIAGNOSIS — M62830 Muscle spasm of back: Secondary | ICD-10-CM | POA: Diagnosis not present

## 2018-03-12 DIAGNOSIS — M5136 Other intervertebral disc degeneration, lumbar region: Secondary | ICD-10-CM | POA: Diagnosis not present

## 2018-03-12 DIAGNOSIS — M9903 Segmental and somatic dysfunction of lumbar region: Secondary | ICD-10-CM | POA: Diagnosis not present

## 2018-03-16 DIAGNOSIS — M9903 Segmental and somatic dysfunction of lumbar region: Secondary | ICD-10-CM | POA: Diagnosis not present

## 2018-03-16 DIAGNOSIS — M62830 Muscle spasm of back: Secondary | ICD-10-CM | POA: Diagnosis not present

## 2018-03-16 DIAGNOSIS — M5136 Other intervertebral disc degeneration, lumbar region: Secondary | ICD-10-CM | POA: Diagnosis not present

## 2018-03-16 DIAGNOSIS — M545 Low back pain: Secondary | ICD-10-CM | POA: Diagnosis not present

## 2018-03-17 DIAGNOSIS — M5136 Other intervertebral disc degeneration, lumbar region: Secondary | ICD-10-CM | POA: Diagnosis not present

## 2018-03-17 DIAGNOSIS — M545 Low back pain: Secondary | ICD-10-CM | POA: Diagnosis not present

## 2018-03-17 DIAGNOSIS — M62830 Muscle spasm of back: Secondary | ICD-10-CM | POA: Diagnosis not present

## 2018-03-17 DIAGNOSIS — M9903 Segmental and somatic dysfunction of lumbar region: Secondary | ICD-10-CM | POA: Diagnosis not present

## 2018-03-19 DIAGNOSIS — M62830 Muscle spasm of back: Secondary | ICD-10-CM | POA: Diagnosis not present

## 2018-03-19 DIAGNOSIS — M9903 Segmental and somatic dysfunction of lumbar region: Secondary | ICD-10-CM | POA: Diagnosis not present

## 2018-03-19 DIAGNOSIS — M5136 Other intervertebral disc degeneration, lumbar region: Secondary | ICD-10-CM | POA: Diagnosis not present

## 2018-03-19 DIAGNOSIS — M545 Low back pain: Secondary | ICD-10-CM | POA: Diagnosis not present

## 2018-03-24 DIAGNOSIS — M62830 Muscle spasm of back: Secondary | ICD-10-CM | POA: Diagnosis not present

## 2018-03-24 DIAGNOSIS — M9903 Segmental and somatic dysfunction of lumbar region: Secondary | ICD-10-CM | POA: Diagnosis not present

## 2018-03-24 DIAGNOSIS — M5136 Other intervertebral disc degeneration, lumbar region: Secondary | ICD-10-CM | POA: Diagnosis not present

## 2018-03-24 DIAGNOSIS — M545 Low back pain: Secondary | ICD-10-CM | POA: Diagnosis not present

## 2018-03-26 DIAGNOSIS — M62830 Muscle spasm of back: Secondary | ICD-10-CM | POA: Diagnosis not present

## 2018-03-26 DIAGNOSIS — M9903 Segmental and somatic dysfunction of lumbar region: Secondary | ICD-10-CM | POA: Diagnosis not present

## 2018-03-26 DIAGNOSIS — M5136 Other intervertebral disc degeneration, lumbar region: Secondary | ICD-10-CM | POA: Diagnosis not present

## 2018-03-26 DIAGNOSIS — M545 Low back pain: Secondary | ICD-10-CM | POA: Diagnosis not present

## 2018-03-31 DIAGNOSIS — M9903 Segmental and somatic dysfunction of lumbar region: Secondary | ICD-10-CM | POA: Diagnosis not present

## 2018-03-31 DIAGNOSIS — M5136 Other intervertebral disc degeneration, lumbar region: Secondary | ICD-10-CM | POA: Diagnosis not present

## 2018-03-31 DIAGNOSIS — M62830 Muscle spasm of back: Secondary | ICD-10-CM | POA: Diagnosis not present

## 2018-03-31 DIAGNOSIS — M545 Low back pain: Secondary | ICD-10-CM | POA: Diagnosis not present

## 2018-04-02 DIAGNOSIS — M62830 Muscle spasm of back: Secondary | ICD-10-CM | POA: Diagnosis not present

## 2018-04-02 DIAGNOSIS — M5136 Other intervertebral disc degeneration, lumbar region: Secondary | ICD-10-CM | POA: Diagnosis not present

## 2018-04-02 DIAGNOSIS — M9903 Segmental and somatic dysfunction of lumbar region: Secondary | ICD-10-CM | POA: Diagnosis not present

## 2018-04-02 DIAGNOSIS — M545 Low back pain: Secondary | ICD-10-CM | POA: Diagnosis not present

## 2018-04-14 DIAGNOSIS — M5136 Other intervertebral disc degeneration, lumbar region: Secondary | ICD-10-CM | POA: Diagnosis not present

## 2018-04-14 DIAGNOSIS — M9903 Segmental and somatic dysfunction of lumbar region: Secondary | ICD-10-CM | POA: Diagnosis not present

## 2018-04-14 DIAGNOSIS — M545 Low back pain: Secondary | ICD-10-CM | POA: Diagnosis not present

## 2018-04-14 DIAGNOSIS — M62830 Muscle spasm of back: Secondary | ICD-10-CM | POA: Diagnosis not present

## 2018-04-16 DIAGNOSIS — M5136 Other intervertebral disc degeneration, lumbar region: Secondary | ICD-10-CM | POA: Diagnosis not present

## 2018-04-16 DIAGNOSIS — M62830 Muscle spasm of back: Secondary | ICD-10-CM | POA: Diagnosis not present

## 2018-04-16 DIAGNOSIS — M9903 Segmental and somatic dysfunction of lumbar region: Secondary | ICD-10-CM | POA: Diagnosis not present

## 2018-04-16 DIAGNOSIS — M545 Low back pain: Secondary | ICD-10-CM | POA: Diagnosis not present

## 2018-04-28 DIAGNOSIS — M545 Low back pain: Secondary | ICD-10-CM | POA: Diagnosis not present

## 2018-04-28 DIAGNOSIS — M5136 Other intervertebral disc degeneration, lumbar region: Secondary | ICD-10-CM | POA: Diagnosis not present

## 2018-04-28 DIAGNOSIS — M62830 Muscle spasm of back: Secondary | ICD-10-CM | POA: Diagnosis not present

## 2018-04-28 DIAGNOSIS — M9903 Segmental and somatic dysfunction of lumbar region: Secondary | ICD-10-CM | POA: Diagnosis not present

## 2018-05-01 ENCOUNTER — Other Ambulatory Visit: Payer: Self-pay | Admitting: Family Medicine

## 2018-05-01 NOTE — Telephone Encounter (Signed)
Ok to refill??  Last office visit 09/22/2017.  Last refill 09/22/2017, #3 refill.

## 2018-05-19 DIAGNOSIS — M545 Low back pain: Secondary | ICD-10-CM | POA: Diagnosis not present

## 2018-05-19 DIAGNOSIS — M62830 Muscle spasm of back: Secondary | ICD-10-CM | POA: Diagnosis not present

## 2018-05-19 DIAGNOSIS — M9903 Segmental and somatic dysfunction of lumbar region: Secondary | ICD-10-CM | POA: Diagnosis not present

## 2018-05-19 DIAGNOSIS — M5136 Other intervertebral disc degeneration, lumbar region: Secondary | ICD-10-CM | POA: Diagnosis not present

## 2018-06-08 DIAGNOSIS — L821 Other seborrheic keratosis: Secondary | ICD-10-CM | POA: Diagnosis not present

## 2018-06-08 DIAGNOSIS — Z85828 Personal history of other malignant neoplasm of skin: Secondary | ICD-10-CM | POA: Diagnosis not present

## 2018-06-08 DIAGNOSIS — D1801 Hemangioma of skin and subcutaneous tissue: Secondary | ICD-10-CM | POA: Diagnosis not present

## 2018-06-08 DIAGNOSIS — D225 Melanocytic nevi of trunk: Secondary | ICD-10-CM | POA: Diagnosis not present

## 2018-06-16 DIAGNOSIS — M62830 Muscle spasm of back: Secondary | ICD-10-CM | POA: Diagnosis not present

## 2018-06-16 DIAGNOSIS — M9903 Segmental and somatic dysfunction of lumbar region: Secondary | ICD-10-CM | POA: Diagnosis not present

## 2018-06-16 DIAGNOSIS — M545 Low back pain: Secondary | ICD-10-CM | POA: Diagnosis not present

## 2018-06-16 DIAGNOSIS — M5136 Other intervertebral disc degeneration, lumbar region: Secondary | ICD-10-CM | POA: Diagnosis not present

## 2018-06-25 ENCOUNTER — Ambulatory Visit (INDEPENDENT_AMBULATORY_CARE_PROVIDER_SITE_OTHER): Payer: BLUE CROSS/BLUE SHIELD | Admitting: Family Medicine

## 2018-06-25 ENCOUNTER — Encounter: Payer: Self-pay | Admitting: Family Medicine

## 2018-06-25 VITALS — BP 142/84 | HR 74 | Temp 98.2°F | Resp 18 | Ht 73.5 in | Wt 284.0 lb

## 2018-06-25 DIAGNOSIS — Z13220 Encounter for screening for lipoid disorders: Secondary | ICD-10-CM

## 2018-06-25 DIAGNOSIS — Z0000 Encounter for general adult medical examination without abnormal findings: Secondary | ICD-10-CM

## 2018-06-25 DIAGNOSIS — E669 Obesity, unspecified: Secondary | ICD-10-CM | POA: Diagnosis not present

## 2018-06-25 DIAGNOSIS — Z125 Encounter for screening for malignant neoplasm of prostate: Secondary | ICD-10-CM | POA: Diagnosis not present

## 2018-06-25 NOTE — Progress Notes (Signed)
Subjective:    Patient ID: Ryan Turner, male    DOB: 08/16/1950, 67 y.o.   MRN: [id number]  HPI  Patient is here today for complete physical exam. Last colonoscopy was 2015.  Colonoscopy is due again in 2020. He does have a history of colon polyps. He is due today for a prostate cancer screening. Hepatitis C screening is up-to-date.  Had his flu shot already this year.   Immunization History  Administered Date(s) Administered  . Influenza, High Dose Seasonal PF 04/17/2018  . Influenza-Unspecified 03/22/2015, 03/01/2017  . Pneumococcal Conjugate-13 01/21/2015  . Pneumococcal Polysaccharide-23 07/22/2013  . Tdap 10/23/2015  . Zoster 07/22/2013    Past Medical History:  Diagnosis Date  . Cataract 2014   removed  . DDD (degenerative disc disease), lumbar   . Diverticulosis   . Headache   . Hypertension   . IBS (irritable bowel syndrome)    Past Surgical History:  Procedure Laterality Date  . COLON SURGERY  1995   fistula  . EYE SURGERY  2014   catarats/lens replaced  . mohs  2015   skin cancer from nose  . VASECTOMY  1985   Current Outpatient Medications on File Prior to Visit  Medication Sig Dispense Refill  . celecoxib (CELEBREX) 200 MG capsule Take 1 capsule (200 mg total) by mouth as needed. 90 capsule 1  . docusate sodium (COLACE) 100 MG capsule Take 200 mg by mouth daily.    . fluticasone (FLONASE) 50 MCG/ACT nasal spray Place into both nostrils as needed for allergies or rhinitis.    . Glucosamine-Chondroit-Vit C-Mn (GLUCOSAMINE CHONDROITIN COMPLX) CAPS Take 2 capsules by mouth daily. 1800 mg    . hydrocortisone (PROCTOSOL HC) 2.5 % rectal cream Place 1 application rectally as needed for hemorrhoids or itching.    . Omega-3 Fatty Acids (FISH OIL) 1200 MG CAPS Take 2 capsules by mouth daily.    . propranolol ER (INDERAL LA) 80 MG 24 hr capsule Take 1 capsule (80 mg total) by mouth daily. 90 capsule 4  . zolpidem (AMBIEN) 10 MG tablet TAKE 1/2 TO 1 TABLET AT  BEDTIME AS NEEDED FOR SLEEP 30 tablet 3   No current facility-administered medications on file prior to visit.    Allergies  Allergen Reactions  . Morphine     Other reaction(s): VOMITING   Social History   Socioeconomic History  . Marital status: Married    Spouse name: Not on file  . Number of children: Not on file  . Years of education: College  . Highest education level: Not on file  Occupational History  . Occupation: Designer, multimedia  . Financial resource strain: Not on file  . Food insecurity:    Worry: Not on file    Inability: Not on file  . Transportation needs:    Medical: Not on file    Non-medical: Not on file  Tobacco Use  . Smoking status: Current Some Day Smoker    Types: Cigars  . Smokeless tobacco: Never Used  . Tobacco comment: quit cigarettes in 1973  Substance and Sexual Activity  . Alcohol use: Yes    Alcohol/week: 6.0 standard drinks    Types: 2 Cans of beer, 4 Shots of liquor per week  . Drug use: No  . Sexual activity: Yes    Birth control/protection: None  Lifestyle  . Physical activity:    Days per week: Not on file    Minutes per session: Not on file  .  Stress: Not on file  Relationships  . Social connections:    Talks on phone: Not on file    Gets together: Not on file    Attends religious service: Not on file    Active member of club or organization: Not on file    Attends meetings of clubs or organizations: Not on file    Relationship status: Not on file  . Intimate partner violence:    Fear of current or ex partner: Not on file    Emotionally abused: Not on file    Physically abused: Not on file    Forced sexual activity: Not on file  Other Topics Concern  . Not on file  Social History Narrative   Lives at home with wife.   Family History  Problem Relation Age of Onset  . Cancer Mother        lung (unknown primary)  . Cancer Father        lung mets to spine  . Cancer Brother        melanoma  . Leukemia Maternal  Grandfather      Review of Systems  All other systems reviewed and are negative.      Objective:   Physical Exam  Constitutional: He is oriented to person, place, and time. He appears well-developed and well-nourished. No distress.  HENT:  Head: Normocephalic and atraumatic.  Right Ear: External ear normal.  Left Ear: External ear normal.  Nose: Nose normal.  Mouth/Throat: Oropharynx is clear and moist. No oropharyngeal exudate.  Eyes: Pupils are equal, round, and reactive to light. Conjunctivae and EOM are normal. Right eye exhibits no discharge. Left eye exhibits no discharge. No scleral icterus.  Neck: Normal range of motion. Neck supple. No JVD present. No tracheal deviation present. No thyromegaly present.  Cardiovascular: Normal rate, regular rhythm and normal heart sounds. Exam reveals no gallop and no friction rub.  No murmur heard. Pulmonary/Chest: Effort normal and breath sounds normal. No stridor. No respiratory distress. He has no wheezes. He has no rales. He exhibits no tenderness.  Abdominal: Soft. Bowel sounds are normal. He exhibits no distension and no mass. There is no tenderness. There is no rebound and no guarding.  Genitourinary: Rectum normal and prostate normal.  Musculoskeletal: Normal range of motion. He exhibits no edema or tenderness.  Lymphadenopathy:    He has no cervical adenopathy.  Neurological: He is alert and oriented to person, place, and time. He has normal reflexes. No cranial nerve deficit. He exhibits normal muscle tone. Coordination normal.  Skin: Skin is warm. No rash noted. He is not diaphoretic. No erythema. No pallor.  Psychiatric: He has a normal mood and affect. His behavior is normal. Judgment and thought content normal.  Vitals reviewed.  Body mass index is 36.96 kg/m.        Assessment & Plan:  General medical exam - Plan: CBC with Differential/Platelet, COMPLETE METABOLIC PANEL WITH GFR, Lipid panel, PSA  Prostate cancer  screening - Plan: PSA  Screening cholesterol level - Plan: CBC with Differential/Platelet, COMPLETE METABOLIC PANEL WITH GFR, Lipid panel  Physical exam today is completely normal.  I recommended 30 minutes a day 5 days a week of aerobic exercise.  I recommended a low carbohydrate low saturated fat diet.  Patient's immunizations are up-to-date.  I will check a PSA to screen for prostate cancer.  Rectal exam is normal.  He is due for colonoscopy next year.  Return fasting for CBC, CMP, fasting lipid panel,  and a PSA.  Encourage the patient to check his blood pressure at home every day and if greater than 140/90, I would add additional medication.  He has whitecoat syndrome he states that his blood pressure at home is typically in the 322G systolic.  I have encouraged him to monitor more closely.  End of his preventative care is up-to-date

## 2018-06-26 ENCOUNTER — Other Ambulatory Visit: Payer: BLUE CROSS/BLUE SHIELD

## 2018-06-26 DIAGNOSIS — Z13220 Encounter for screening for lipoid disorders: Secondary | ICD-10-CM | POA: Diagnosis not present

## 2018-06-26 DIAGNOSIS — Z0000 Encounter for general adult medical examination without abnormal findings: Secondary | ICD-10-CM | POA: Diagnosis not present

## 2018-06-26 DIAGNOSIS — Z125 Encounter for screening for malignant neoplasm of prostate: Secondary | ICD-10-CM | POA: Diagnosis not present

## 2018-06-27 LAB — CBC WITH DIFFERENTIAL/PLATELET
Basophils Absolute: 30 cells/uL (ref 0–200)
Basophils Relative: 0.6 %
EOS PCT: 2 %
Eosinophils Absolute: 100 cells/uL (ref 15–500)
HCT: 47.1 % (ref 38.5–50.0)
Hemoglobin: 15.8 g/dL (ref 13.2–17.1)
Lymphs Abs: 1130 cells/uL (ref 850–3900)
MCH: 32 pg (ref 27.0–33.0)
MCHC: 33.5 g/dL (ref 32.0–36.0)
MCV: 95.3 fL (ref 80.0–100.0)
MPV: 10 fL (ref 7.5–12.5)
Monocytes Relative: 8.4 %
Neutro Abs: 3320 cells/uL (ref 1500–7800)
Neutrophils Relative %: 66.4 %
Platelets: 142 10*3/uL (ref 140–400)
RBC: 4.94 10*6/uL (ref 4.20–5.80)
RDW: 12.2 % (ref 11.0–15.0)
TOTAL LYMPHOCYTE: 22.6 %
WBC mixed population: 420 cells/uL (ref 200–950)
WBC: 5 10*3/uL (ref 3.8–10.8)

## 2018-06-27 LAB — COMPLETE METABOLIC PANEL WITH GFR
AG Ratio: 2 (calc) (ref 1.0–2.5)
ALT: 19 U/L (ref 9–46)
AST: 15 U/L (ref 10–35)
Albumin: 4.1 g/dL (ref 3.6–5.1)
Alkaline phosphatase (APISO): 61 U/L (ref 40–115)
BUN: 14 mg/dL (ref 7–25)
CO2: 24 mmol/L (ref 20–32)
Calcium: 9.2 mg/dL (ref 8.6–10.3)
Chloride: 104 mmol/L (ref 98–110)
Creat: 0.83 mg/dL (ref 0.70–1.25)
GFR, EST AFRICAN AMERICAN: 104 mL/min/{1.73_m2} (ref >=60)
GFR, Est Non African American: 90 mL/min/{1.73_m2} (ref >=60)
Globulin: 2.1 g/dL (calc) (ref 1.9–3.7)
Glucose, Bld: 99 mg/dL (ref 65–99)
Potassium: 4.5 mmol/L (ref 3.5–5.3)
Sodium: 139 mmol/L (ref 135–146)
TOTAL PROTEIN: 6.2 g/dL (ref 6.1–8.1)
Total Bilirubin: 0.7 mg/dL (ref 0.2–1.2)

## 2018-06-27 LAB — LIPID PANEL
Cholesterol: 173 mg/dL (ref <200)
HDL: 41 mg/dL (ref >40)
LDL Cholesterol (Calc): 115 mg/dL (calc) — ABNORMAL HIGH
Non-HDL Cholesterol (Calc): 132 mg/dL (calc) — ABNORMAL HIGH (ref <130)
Total CHOL/HDL Ratio: 4.2 (calc) (ref <5.0)
Triglycerides: 74 mg/dL (ref <150)

## 2018-06-27 LAB — PSA: PSA: 0.4 ng/mL (ref <=4.0)

## 2018-07-06 ENCOUNTER — Other Ambulatory Visit: Payer: Self-pay | Admitting: Family Medicine

## 2018-07-07 DIAGNOSIS — M9903 Segmental and somatic dysfunction of lumbar region: Secondary | ICD-10-CM | POA: Diagnosis not present

## 2018-07-07 DIAGNOSIS — M5136 Other intervertebral disc degeneration, lumbar region: Secondary | ICD-10-CM | POA: Diagnosis not present

## 2018-07-07 DIAGNOSIS — M545 Low back pain: Secondary | ICD-10-CM | POA: Diagnosis not present

## 2018-07-07 DIAGNOSIS — M62830 Muscle spasm of back: Secondary | ICD-10-CM | POA: Diagnosis not present

## 2018-07-10 ENCOUNTER — Other Ambulatory Visit: Payer: Self-pay | Admitting: Neurology

## 2018-08-03 ENCOUNTER — Other Ambulatory Visit: Payer: Self-pay | Admitting: Neurology

## 2018-08-03 ENCOUNTER — Encounter: Payer: Self-pay | Admitting: Family Medicine

## 2018-08-04 MED ORDER — PROPRANOLOL HCL ER 80 MG PO CP24: 80.0000 mg | ORAL_CAPSULE | Freq: Every day | ORAL | 3 refills | Status: DC

## 2018-10-16 ENCOUNTER — Other Ambulatory Visit: Payer: Self-pay | Admitting: Family Medicine

## 2018-10-16 NOTE — Telephone Encounter (Signed)
Ok to refill??  Last office visit 06/25/2018.  Last refill 05/04/2018, #3 refills.

## 2018-12-10 DIAGNOSIS — D225 Melanocytic nevi of trunk: Secondary | ICD-10-CM | POA: Diagnosis not present

## 2018-12-10 DIAGNOSIS — L570 Actinic keratosis: Secondary | ICD-10-CM | POA: Diagnosis not present

## 2018-12-10 DIAGNOSIS — L820 Inflamed seborrheic keratosis: Secondary | ICD-10-CM | POA: Diagnosis not present

## 2018-12-10 DIAGNOSIS — L821 Other seborrheic keratosis: Secondary | ICD-10-CM | POA: Diagnosis not present

## 2018-12-10 DIAGNOSIS — Z85828 Personal history of other malignant neoplasm of skin: Secondary | ICD-10-CM | POA: Diagnosis not present

## 2018-12-10 DIAGNOSIS — D1801 Hemangioma of skin and subcutaneous tissue: Secondary | ICD-10-CM | POA: Diagnosis not present

## 2018-12-31 ENCOUNTER — Other Ambulatory Visit: Payer: Self-pay

## 2018-12-31 ENCOUNTER — Ambulatory Visit (INDEPENDENT_AMBULATORY_CARE_PROVIDER_SITE_OTHER): Payer: BC Managed Care – PPO | Admitting: Family Medicine

## 2018-12-31 ENCOUNTER — Encounter: Payer: Self-pay | Admitting: Family Medicine

## 2018-12-31 VITALS — BP 130/86 | HR 76 | Temp 98.1°F | Resp 16 | Ht 73.5 in | Wt 267.0 lb

## 2018-12-31 DIAGNOSIS — Z1211 Encounter for screening for malignant neoplasm of colon: Secondary | ICD-10-CM | POA: Diagnosis not present

## 2018-12-31 DIAGNOSIS — E7800 Pure hypercholesterolemia, unspecified: Secondary | ICD-10-CM | POA: Diagnosis not present

## 2018-12-31 NOTE — Progress Notes (Signed)
Subjective:    Patient ID: Ryan Turner, male    DOB: [birth year], 68 y.o.   MRN: [id number]  HPI Since I last saw the patient, he is lost almost 20 pounds by starting the keto diet.  His blood pressure has been outstanding and is averaging between 120 and 130/80-86.  He is interested to see what his cholesterol he is with weight loss on the keto diet.  Of note he is still taking propranolol for headache prevention.  He is not had a headache in more than a year.  He questions if he needs to continue taking that medication.  He is also taking Celebrex every day despite not having any joint pains.  He states that since he is lost the weight and changed his diet his joints no longer hurt. Past Medical History:  Diagnosis Date  . Cataract 2014   removed  . DDD (degenerative disc disease), lumbar   . Diverticulosis   . Headache   . Hypertension   . IBS (irritable bowel syndrome)    Past Surgical History:  Procedure Laterality Date  . COLON SURGERY  1995   fistula  . EYE SURGERY  2014   catarats/lens replaced  . mohs  2015   skin cancer from nose  . VASECTOMY  1985   Current Outpatient Medications on File Prior to Visit  Medication Sig Dispense Refill  . celecoxib (CELEBREX) 200 MG capsule TAKE 1 CAPSULE (200 MG TOTAL) BY MOUTH AS NEEDED. 30 capsule 5  . docusate sodium (COLACE) 100 MG capsule Take 200 mg by mouth daily.    . fluticasone (FLONASE) 50 MCG/ACT nasal spray Place into both nostrils as needed for allergies or rhinitis.    . Glucosamine-Chondroit-Vit C-Mn (GLUCOSAMINE CHONDROITIN COMPLX) CAPS Take 2 capsules by mouth daily. 1800 mg    . hydrocortisone (PROCTOSOL HC) 2.5 % rectal cream Place 1 application rectally as needed for hemorrhoids or itching.    . Omega-3 Fatty Acids (FISH OIL) 1200 MG CAPS Take 2 capsules by mouth daily.    . propranolol ER (INDERAL LA) 80 MG 24 hr capsule Take 1 capsule (80 mg total) by mouth daily. 90 capsule 3  . zolpidem (AMBIEN) 10 MG tablet  TAKE 1/2 TO 1 TABLET BY MOUTH AT BEDTIME AS NEEDED FOR SLEEP 30 tablet 3   No current facility-administered medications on file prior to visit.    Allergies  Allergen Reactions  . Morphine     Other reaction(s): VOMITING   Social History   Socioeconomic History  . Marital status: Married    Spouse name: Not on file  . Number of children: Not on file  . Years of education: College  . Highest education level: Not on file  Occupational History  . Occupation: Designer, multimedia  . Financial resource strain: Not on file  . Food insecurity    Worry: Not on file    Inability: Not on file  . Transportation needs    Medical: Not on file    Non-medical: Not on file  Tobacco Use  . Smoking status: Current Some Day Smoker    Types: Cigars  . Smokeless tobacco: Never Used  . Tobacco comment: quit cigarettes in 1973  Substance and Sexual Activity  . Alcohol use: Yes    Alcohol/week: 6.0 standard drinks    Types: 2 Cans of beer, 4 Shots of liquor per week  . Drug use: No  . Sexual activity: Yes    Birth control/protection: None  Lifestyle  . Physical activity    Days per week: Not on file    Minutes per session: Not on file  . Stress: Not on file  Relationships  . Social Herbalist on phone: Not on file    Gets together: Not on file    Attends religious service: Not on file    Active member of club or organization: Not on file    Attends meetings of clubs or organizations: Not on file    Relationship status: Not on file  . Intimate partner violence    Fear of current or ex partner: Not on file    Emotionally abused: Not on file    Physically abused: Not on file    Forced sexual activity: Not on file  Other Topics Concern  . Not on file  Social History Narrative   Lives at home with wife.      Review of Systems  All other systems reviewed and are negative.      Objective:   Physical Exam Vitals signs reviewed.  Constitutional:      Appearance:  Normal appearance.  Cardiovascular:     Rate and Rhythm: Normal rate and regular rhythm.     Pulses: Normal pulses.     Heart sounds: Normal heart sounds. No murmur. No friction rub.  Pulmonary:     Effort: Pulmonary effort is normal. No respiratory distress.     Breath sounds: Normal breath sounds. No wheezing or rhonchi.  Neurological:     Mental Status: He is alert.           Assessment & Plan:  1. Pure hypercholesterolemia Check fasting lipid panel.  Ideally I like his LDL cholesterol to be less than 100.  I am very happy with the patient's blood pressure and his weight loss.  Recommended we wean off propranolol to see if the patient still requires the medication.  Decrease to every other day for 2 weeks then every third day for 2 weeks then stop.  I recommended he discontinue Celebrex and use only if needed. - CBC with Differential/Platelet - COMPLETE METABOLIC PANEL WITH GFR - Lipid panel  2. Colon cancer screening Patient for screening colonoscopy.  His last colonoscopy was 5 years ago and he is due every 5 years due to history of polyps - Ambulatory referral to Gastroenterology

## 2019-01-01 ENCOUNTER — Telehealth: Payer: Self-pay | Admitting: Gastroenterology

## 2019-01-01 LAB — COMPLETE METABOLIC PANEL WITH GFR
AG Ratio: 2.2 (calc) (ref 1.0–2.5)
ALT: 17 U/L (ref 9–46)
AST: 15 U/L (ref 10–35)
Albumin: 4.3 g/dL (ref 3.6–5.1)
Alkaline phosphatase (APISO): 61 U/L (ref 35–144)
BUN: 15 mg/dL (ref 7–25)
CO2: 26 mmol/L (ref 20–32)
Calcium: 9.3 mg/dL (ref 8.6–10.3)
Chloride: 106 mmol/L (ref 98–110)
Creat: 0.93 mg/dL (ref 0.70–1.18)
GFR, Est African American: 96 mL/min/{1.73_m2} (ref >=60)
GFR, Est Non African American: 83 mL/min/{1.73_m2} (ref >=60)
Globulin: 2 g/dL (calc) (ref 1.9–3.7)
Glucose, Bld: 92 mg/dL (ref 65–99)
Potassium: 4.9 mmol/L (ref 3.5–5.3)
Sodium: 142 mmol/L (ref 135–146)
Total Bilirubin: 0.8 mg/dL (ref 0.2–1.2)
Total Protein: 6.3 g/dL (ref 6.1–8.1)

## 2019-01-01 LAB — LIPID PANEL
Cholesterol: 162 mg/dL (ref <200)
HDL: 42 mg/dL (ref >=40)
LDL Cholesterol (Calc): 101 mg/dL (calc) — ABNORMAL HIGH
Non-HDL Cholesterol (Calc): 120 mg/dL (calc) (ref <130)
Total CHOL/HDL Ratio: 3.9 (calc) (ref <5.0)
Triglycerides: 92 mg/dL (ref <150)

## 2019-01-01 LAB — CBC WITH DIFFERENTIAL/PLATELET
Absolute Monocytes: 428 cells/uL (ref 200–950)
Basophils Absolute: 19 cells/uL (ref 0–200)
Basophils Relative: 0.4 %
Eosinophils Absolute: 52 cells/uL (ref 15–500)
Eosinophils Relative: 1.1 %
HCT: 47.6 % (ref 38.5–50.0)
Hemoglobin: 16.1 g/dL (ref 13.2–17.1)
Lymphs Abs: 1391 cells/uL (ref 850–3900)
MCH: 32 pg (ref 27.0–33.0)
MCHC: 33.8 g/dL (ref 32.0–36.0)
MCV: 94.6 fL (ref 80.0–100.0)
MPV: 10.2 fL (ref 7.5–12.5)
Monocytes Relative: 9.1 %
Neutro Abs: 2811 cells/uL (ref 1500–7800)
Neutrophils Relative %: 59.8 %
Platelets: 177 10*3/uL (ref 140–400)
RBC: 5.03 10*6/uL (ref 4.20–5.80)
RDW: 12.3 % (ref 11.0–15.0)
Total Lymphocyte: 29.6 %
WBC: 4.7 10*3/uL (ref 3.8–10.8)

## 2019-01-01 NOTE — Telephone Encounter (Signed)
DOD 01-01-19 PM Dr. Havery Moros  We received a referral for patient to have a colonoscopy. His last colonoscopy was on 01-2014 and states he is due for another one. Records will be sent for review by DOD. Dr. Havery Moros please advise for scheduling

## 2019-01-02 ENCOUNTER — Other Ambulatory Visit: Payer: Self-pay | Admitting: Family Medicine

## 2019-01-07 ENCOUNTER — Encounter: Payer: Self-pay | Admitting: Gastroenterology

## 2019-01-19 DIAGNOSIS — F4324 Adjustment disorder with disturbance of conduct: Secondary | ICD-10-CM | POA: Diagnosis not present

## 2019-02-02 ENCOUNTER — Ambulatory Visit: Payer: BC Managed Care – PPO | Admitting: *Deleted

## 2019-02-02 ENCOUNTER — Other Ambulatory Visit: Payer: Self-pay

## 2019-02-02 VITALS — Ht 73.0 in | Wt 265.0 lb

## 2019-02-02 DIAGNOSIS — Z86010 Personal history of colonic polyps: Secondary | ICD-10-CM

## 2019-02-02 MED ORDER — NA SULFATE-K SULFATE-MG SULF 17.5-3.13-1.6 GM/177ML PO SOLN: 1.0000 | Freq: Once | ORAL | 0 refills | Status: AC

## 2019-02-02 NOTE — Progress Notes (Signed)

## 2019-02-15 ENCOUNTER — Telehealth: Payer: Self-pay | Admitting: Gastroenterology

## 2019-02-15 NOTE — Telephone Encounter (Signed)

## 2019-02-16 ENCOUNTER — Encounter: Payer: Self-pay | Admitting: Gastroenterology

## 2019-02-16 ENCOUNTER — Other Ambulatory Visit: Payer: Self-pay

## 2019-02-16 ENCOUNTER — Ambulatory Visit (AMBULATORY_SURGERY_CENTER): Payer: BC Managed Care – PPO | Admitting: Gastroenterology

## 2019-02-16 VITALS — BP 135/98 | HR 68 | Temp 97.6°F | Resp 11 | Ht 73.0 in | Wt 265.0 lb

## 2019-02-16 DIAGNOSIS — Z86010 Personal history of colonic polyps: Secondary | ICD-10-CM

## 2019-02-16 DIAGNOSIS — K635 Polyp of colon: Secondary | ICD-10-CM

## 2019-02-16 DIAGNOSIS — D122 Benign neoplasm of ascending colon: Secondary | ICD-10-CM | POA: Diagnosis not present

## 2019-02-16 DIAGNOSIS — D120 Benign neoplasm of cecum: Secondary | ICD-10-CM | POA: Diagnosis not present

## 2019-02-16 DIAGNOSIS — Z1211 Encounter for screening for malignant neoplasm of colon: Secondary | ICD-10-CM | POA: Diagnosis not present

## 2019-02-16 DIAGNOSIS — D125 Benign neoplasm of sigmoid colon: Secondary | ICD-10-CM

## 2019-02-16 DIAGNOSIS — D123 Benign neoplasm of transverse colon: Secondary | ICD-10-CM | POA: Diagnosis not present

## 2019-02-16 MED ORDER — SODIUM CHLORIDE 0.9 % IV SOLN: 500.0000 mL | Freq: Once | INTRAVENOUS | Status: DC

## 2019-02-16 NOTE — Progress Notes (Signed)
Called to room to assist during endoscopic procedure.  Patient ID and intended procedure confirmed with present staff. Received instructions for my participation in the procedure from the performing physician.  

## 2019-02-16 NOTE — Progress Notes (Signed)
A and O x3. Report to RN. Tolerated MAC anesthesia well.

## 2019-02-16 NOTE — Op Note (Signed)
[place] Patient Name: Ryan Turner Procedure Date: 02/16/2019 9:56 AM MRN: [id number] Endoscopist: Remo Lipps P. Havery Ryan Turner , MD Age: 68 Referring MD:  Date of Birth: [birth year] Gender: Male Account #: [id number] Procedure:                Colonoscopy Indications:              High risk colon cancer surveillance: Personal                            history of colonic polyps Medicines:                Monitored Anesthesia Care Procedure:                Pre-Anesthesia Assessment:                           - Prior to the procedure, a History and Physical                            was performed, and patient medications and                            allergies were reviewed. The patient's tolerance of                            previous anesthesia was also reviewed. The risks                            and benefits of the procedure and the sedation                            options and risks were discussed with the patient.                            All questions were answered, and informed consent                            was obtained. Prior Anticoagulants: The patient has                            taken no previous anticoagulant or antiplatelet                            agents. ASA Grade Assessment: II - A patient with                            mild systemic disease. After reviewing the risks                            and benefits, the patient was deemed in                            satisfactory condition to undergo the procedure.  After obtaining informed consent, the colonoscope                            was passed under direct vision. Throughout the                            procedure, the patient's blood pressure, pulse, and                            oxygen saturations were monitored continuously. The                            Colonoscope was introduced through the anus and                            advanced to the the cecum,  identified by                            appendiceal orifice and ileocecal valve. The                            colonoscopy was performed without difficulty. The                            patient tolerated the procedure well. The quality                            of the bowel preparation was good. The ileocecal                            valve, appendiceal orifice, and rectum were                            photographed. Scope In: 10:05:54 AM Scope Out: 10:25:34 AM Scope Withdrawal Time: 0 hours 16 minutes 31 seconds  Total Procedure Duration: 0 hours 19 minutes 40 seconds  Findings:                 The perianal and digital rectal examinations were                            normal.                           A diminutive polyp was found in the cecum. The                            polyp was sessile. The polyp was removed with a                            cold snare. Resection and retrieval were complete.                           Two sessile polyps were found in the ascending  colon. The polyps were 2 to 3 mm in size. These                            polyps were removed with a cold snare. Resection                            and retrieval were complete.                           A single small angiodysplastic lesion was found at                            the hepatic flexure.                           A 4 mm polyp was found in the transverse colon. The                            polyp was sessile. The polyp was removed with a                            cold snare. Resection and retrieval were complete.                           A 4 mm polyp was found in the sigmoid colon. The                            polyp was sessile. The polyp was removed with a                            cold snare. Resection and retrieval were complete.                           Multiple small and large-mouthed diverticula were                            found in the entire colon,  highest burden in the                            sigmoid colon.                           Internal hemorrhoids were found during retroflexion.                           The exam was otherwise without abnormality. Complications:            No immediate complications. Estimated blood loss:                            Minimal. Estimated Blood Loss:     Estimated blood loss was minimal. Impression:               - One diminutive polyp in the cecum, removed with a  cold snare. Resected and retrieved.                           - Two 2 to 3 mm polyps in the ascending colon,                            removed with a cold snare. Resected and retrieved.                           - A single colonic angiodysplastic lesion.                           - One 4 mm polyp in the transverse colon, removed                            with a cold snare. Resected and retrieved.                           - One 4 mm polyp in the sigmoid colon, removed with                            a cold snare. Resected and retrieved.                           - Diverticulosis in the entire examined colon.                           - Internal hemorrhoids.                           - The examination was otherwise normal. Recommendation:           - Patient has a contact number available for                            emergencies. The signs and symptoms of potential                            delayed complications were discussed with the                            patient. Return to normal activities tomorrow.                            Written discharge instructions were provided to the                            patient.                           - Resume previous diet.                           - Continue present medications.                           -  Await pathology results. Remo Lipps P. Adger Cantera, MD 02/16/2019 10:30:50 AM This report has been signed electronically.

## 2019-02-16 NOTE — Patient Instructions (Signed)
Handouts given for diverticulosis, hemorrhoids and polyps.  YOU HAD AN ENDOSCOPIC PROCEDURE TODAY AT [facility]:   Refer to the procedure report that was given to you for any specific questions about what was found during the examination.  If the procedure report does not answer your questions, please call your gastroenterologist to clarify.  If you requested that your care partner not be given the details of your procedure findings, then the procedure report has been included in a sealed envelope for you to review at your convenience later.  YOU SHOULD EXPECT: Some feelings of bloating in the abdomen. Passage of more gas than usual.  Walking can help get rid of the air that was put into your GI tract during the procedure and reduce the bloating. If you had a lower endoscopy (such as a colonoscopy or flexible sigmoidoscopy) you may notice spotting of blood in your stool or on the toilet paper. If you underwent a bowel prep for your procedure, you may not have a normal bowel movement for a few days.  Please Note:  You might notice some irritation and congestion in your nose or some drainage.  This is from the oxygen used during your procedure.  There is no need for concern and it should clear up in a day or so.  SYMPTOMS TO REPORT IMMEDIATELY:   Following lower endoscopy (colonoscopy or flexible sigmoidoscopy):  Excessive amounts of blood in the stool  Significant tenderness or worsening of abdominal pains  Swelling of the abdomen that is new, acute  Fever of 100F or higher  For urgent or emergent issues, a gastroenterologist can be reached at any hour by calling [phone].   DIET:  We do recommend a small meal at first, but then you may proceed to your regular diet.  Drink plenty of fluids but you should avoid alcoholic beverages for 24 hours.  ACTIVITY:  You should plan to take it easy for the rest of today and you should NOT DRIVE or use heavy machinery until tomorrow  (because of the sedation medicines used during the test).    FOLLOW UP: Our staff will call the number listed on your records 48-72 hours following your procedure to check on you and address any questions or concerns that you may have regarding the information given to you following your procedure. If we do not reach you, we will leave a message.  We will attempt to reach you two times.  During this call, we will ask if you have developed any symptoms of COVID 19. If you develop any symptoms (ie: fever, flu-like symptoms, shortness of breath, cough etc.) before then, please call [phone].  If you test positive for Covid 19 in the 2 weeks post procedure, please call and report this information to [place].    If any biopsies were taken you will be contacted by phone or by letter within the next 1-3 weeks.  Please call us at [phone] if you have not heard about the biopsies in 3 weeks.    SIGNATURES/CONFIDENTIALITY: You and/or your care partner have signed paperwork which will be entered into your electronic medical record.  These signatures attest to the fact that that the information above on your After Visit Summary has been reviewed and is understood.  Full responsibility of the confidentiality of this discharge information lies with you and/or your care-partner.

## 2019-02-18 ENCOUNTER — Telehealth: Payer: Self-pay

## 2019-02-18 NOTE — Telephone Encounter (Signed)
  Follow up Call-  Call back number 02/16/2019  Post procedure Call Back phone  # [phone]  Permission to leave phone message Yes  Some recent data might be hidden     Patient questions:  Do you have a fever, pain , or abdominal swelling? No. Pain Score  0 *  Have you tolerated food without any problems? Yes.    Have you been able to return to your normal activities? Yes.    Do you have any questions about your discharge instructions: Diet   No. Medications  No. Follow up visit  No.  Do you have questions or concerns about your Care? No.  Actions: * If pain score is 4 or above: No action needed, pain <4. 1. Have you developed a fever since your procedure? no  2.   Have you had an respiratory symptoms (SOB or cough) since your procedure? no  3.   Have you tested positive for COVID 19 since your procedure no  4.   Have you had any family members/close contacts diagnosed with the COVID 19 since your procedure?  no   If yes to any of these questions please route to Joylene John, RN and Alphonsa Gin, Therapist, sports.

## 2019-03-08 DIAGNOSIS — F4324 Adjustment disorder with disturbance of conduct: Secondary | ICD-10-CM | POA: Diagnosis not present

## 2019-04-09 DIAGNOSIS — F4324 Adjustment disorder with disturbance of conduct: Secondary | ICD-10-CM | POA: Diagnosis not present

## 2019-04-21 DIAGNOSIS — F4324 Adjustment disorder with disturbance of conduct: Secondary | ICD-10-CM | POA: Diagnosis not present

## 2019-05-12 DIAGNOSIS — F4324 Adjustment disorder with disturbance of conduct: Secondary | ICD-10-CM | POA: Diagnosis not present

## 2019-05-27 DIAGNOSIS — L570 Actinic keratosis: Secondary | ICD-10-CM | POA: Diagnosis not present

## 2019-05-27 DIAGNOSIS — B353 Tinea pedis: Secondary | ICD-10-CM | POA: Diagnosis not present

## 2019-05-27 DIAGNOSIS — D485 Neoplasm of uncertain behavior of skin: Secondary | ICD-10-CM | POA: Diagnosis not present

## 2019-05-27 DIAGNOSIS — L218 Other seborrheic dermatitis: Secondary | ICD-10-CM | POA: Diagnosis not present

## 2019-05-27 DIAGNOSIS — D225 Melanocytic nevi of trunk: Secondary | ICD-10-CM | POA: Diagnosis not present

## 2019-06-21 DIAGNOSIS — F4324 Adjustment disorder with disturbance of conduct: Secondary | ICD-10-CM | POA: Diagnosis not present

## 2019-06-23 ENCOUNTER — Other Ambulatory Visit: Payer: Self-pay | Admitting: Family Medicine

## 2019-06-24 NOTE — Telephone Encounter (Signed)
Ok to refill??  Last office visit 12/31/2018.  Last refill 3//27/2020, #3 refills.

## 2019-07-08 DIAGNOSIS — L218 Other seborrheic dermatitis: Secondary | ICD-10-CM | POA: Diagnosis not present

## 2019-07-08 DIAGNOSIS — L570 Actinic keratosis: Secondary | ICD-10-CM | POA: Diagnosis not present

## 2019-07-13 ENCOUNTER — Other Ambulatory Visit: Payer: Self-pay

## 2019-07-13 DIAGNOSIS — F4324 Adjustment disorder with disturbance of conduct: Secondary | ICD-10-CM | POA: Diagnosis not present

## 2019-07-14 ENCOUNTER — Ambulatory Visit (INDEPENDENT_AMBULATORY_CARE_PROVIDER_SITE_OTHER): Payer: BC Managed Care – PPO | Admitting: Family Medicine

## 2019-07-14 ENCOUNTER — Encounter: Payer: Self-pay | Admitting: Family Medicine

## 2019-07-14 VITALS — BP 132/74 | HR 84 | Temp 97.2°F | Resp 16 | Ht 73.5 in | Wt 256.0 lb

## 2019-07-14 DIAGNOSIS — Z125 Encounter for screening for malignant neoplasm of prostate: Secondary | ICD-10-CM | POA: Diagnosis not present

## 2019-07-14 DIAGNOSIS — Z0000 Encounter for general adult medical examination without abnormal findings: Secondary | ICD-10-CM

## 2019-07-14 DIAGNOSIS — Z13220 Encounter for screening for lipoid disorders: Secondary | ICD-10-CM

## 2019-07-14 MED ORDER — SILDENAFIL CITRATE 100 MG PO TABS: 50.0000 mg | ORAL_TABLET | Freq: Every day | ORAL | 11 refills | Status: DC | PRN

## 2019-07-14 MED ORDER — HYOSCYAMINE SULFATE 0.125 MG PO TABS: 0.1250 mg | ORAL_TABLET | ORAL | 0 refills | Status: DC | PRN

## 2019-07-14 NOTE — Progress Notes (Signed)
Subjective:    Patient ID: Ryan Turner, male    DOB: 24-May-1951, 68 y.o.   MRN: [id number]  HPI Wt Readings from Last 3 Encounters:  07/14/19 256 lb (116.1 kg)  02/16/19 265 lb (120.2 kg)  02/02/19 265 lb (120.2 kg)    Patient is here today for complete physical exam. Last colonoscopy was in 01/2019 and found several polyps.  Three were adenomatous and therefore, they recommended he have a repeat colonoscopy in 3 years.  He is due today for a prostate cancer screening. Hepatitis C screening is up-to-date.  Had his flu shot already this year.  Patient has lost substantial weight.  His blood pressure today is excellent.  His shot records are up-to-date.  He is due for a PSA. Immunization History  Administered Date(s) Administered  . Influenza, High Dose Seasonal PF 04/17/2018, 05/19/2019, 05/22/2019  . Influenza-Unspecified 03/22/2015, 03/01/2017  . Pneumococcal Conjugate-13 01/21/2015  . Pneumococcal Polysaccharide-23 07/22/2013  . Tdap 10/23/2015  . Zoster 07/22/2013  . Zoster Recombinat (Shingrix) 05/22/2019    Past Medical History:  Diagnosis Date  . Cataract 2014   removed  . DDD (degenerative disc disease), lumbar   . Diverticulosis   . Headache   . Hypertension   . IBS (irritable bowel syndrome)    Past Surgical History:  Procedure Laterality Date  . cataracts    . COLON SURGERY  1995   fistula  . COLONOSCOPY    . EYE SURGERY  2014   catarats/lens replaced  . mohs  2015   skin cancer from nose  . POLYPECTOMY    . VASECTOMY  1985   Current Outpatient Medications on File Prior to Visit  Medication Sig Dispense Refill  . celecoxib (CELEBREX) 200 MG capsule TAKE 1 CAPSULE (200 MG TOTAL) BY MOUTH AS NEEDED. 90 capsule 2  . docusate sodium (COLACE) 100 MG capsule Take 200 mg by mouth daily.    . fluticasone (FLONASE) 50 MCG/ACT nasal spray Place into both nostrils as needed for allergies or rhinitis.    . Glucosamine-Chondroit-Vit C-Mn (GLUCOSAMINE  CHONDROITIN COMPLX) CAPS Take 2 capsules by mouth daily. 1800 mg    . hydrocortisone (PROCTOSOL HC) 2.5 % rectal cream Place 1 application rectally as needed for hemorrhoids or itching.    . Multiple Vitamins-Minerals (OCUVITE PRESERVISION PO) Take by mouth daily.    . Omega-3 Fatty Acids (FISH OIL) 1200 MG CAPS Take 2 capsules by mouth daily.    . propranolol ER (INDERAL LA) 80 MG 24 hr capsule Take 1 capsule (80 mg total) by mouth daily. (Patient not taking: Reported on 02/02/2019) 90 capsule 3  . zolpidem (AMBIEN) 10 MG tablet TAKE 1/2 TO 1 TABLET BY MOUTH AT BEDTIME AS NEEDED FOR SLEEP 30 tablet 3   Current Facility-Administered Medications on File Prior to Visit  Medication Dose Route Frequency Provider Last Rate Last Admin  . 0.9 %  sodium chloride infusion  500 mL Intravenous Once Armbruster, Carlota Raspberry, MD       Allergies  Allergen Reactions  . Morphine     Other reaction(s): VOMITING   Social History   Socioeconomic History  . Marital status: Married    Spouse name: Not on file  . Number of children: Not on file  . Years of education: College  . Highest education level: Not on file  Occupational History  . Occupation: Teacher, music  Tobacco Use  . Smoking status: Current Some Day Smoker    Types: Cigars  . Smokeless  tobacco: Never Used  . Tobacco comment: quit cigarettes in 1973  Substance and Sexual Activity  . Alcohol use: Yes    Alcohol/week: 6.0 standard drinks    Types: 2 Cans of beer, 4 Shots of liquor per week  . Drug use: No  . Sexual activity: Yes    Birth control/protection: None  Other Topics Concern  . Not on file  Social History Narrative   Lives at home with wife.   Social Determinants of Health   Financial Resource Strain:   . Difficulty of Paying Living Expenses: Not on file  Food Insecurity:   . Worried About Charity fundraiser in the Last Year: Not on file  . Ran Out of Food in the Last Year: Not on file  Transportation Needs:   . Lack of  Transportation (Medical): Not on file  . Lack of Transportation (Non-Medical): Not on file  Physical Activity:   . Days of Exercise per Week: Not on file  . Minutes of Exercise per Session: Not on file  Stress:   . Feeling of Stress : Not on file  Social Connections:   . Frequency of Communication with Friends and Family: Not on file  . Frequency of Social Gatherings with Friends and Family: Not on file  . Attends Religious Services: Not on file  . Active Member of Clubs or Organizations: Not on file  . Attends Archivist Meetings: Not on file  . Marital Status: Not on file  Intimate Partner Violence:   . Fear of Current or Ex-Partner: Not on file  . Emotionally Abused: Not on file  . Physically Abused: Not on file  . Sexually Abused: Not on file   Family History  Problem Relation Age of Onset  . Cancer Mother        lung (unknown primary)  . Cancer Father        lung mets to spine  . Cancer Brother        melanoma  . Leukemia Maternal Grandfather   . Stomach cancer Paternal Grandmother   . Colon cancer Neg Hx   . Colon polyps Neg Hx   . Diabetes Neg Hx   . Esophageal cancer Neg Hx   . Rectal cancer Neg Hx      Review of Systems  All other systems reviewed and are negative.      Objective:   Physical Exam  Constitutional: He is oriented to person, place, and time. He appears well-developed and well-nourished. No distress.  HENT:  Head: Normocephalic and atraumatic.  Right Ear: External ear normal.  Left Ear: External ear normal.  Nose: Nose normal.  Mouth/Throat: Oropharynx is clear and moist. No oropharyngeal exudate.  Eyes: Pupils are equal, round, and reactive to light. Conjunctivae and EOM are normal. Right eye exhibits no discharge. Left eye exhibits no discharge. No scleral icterus.  Neck: No JVD present. No tracheal deviation present. No thyromegaly present.  Cardiovascular: Normal rate, regular rhythm and normal heart sounds. Exam reveals no  gallop and no friction rub.  No murmur heard. Pulmonary/Chest: Effort normal and breath sounds normal. No stridor. No respiratory distress. He has no wheezes. He has no rales. He exhibits no tenderness.  Abdominal: Soft. Bowel sounds are normal. He exhibits no distension and no mass. There is no abdominal tenderness. There is no rebound and no guarding.  Musculoskeletal:        General: No tenderness or edema. Normal range of motion.  Cervical back: Normal range of motion and neck supple.  Lymphadenopathy:    He has no cervical adenopathy.  Neurological: He is alert and oriented to person, place, and time. He has normal reflexes. No cranial nerve deficit. He exhibits normal muscle tone. Coordination normal.  Skin: Skin is warm. No rash noted. He is not diaphoretic. No erythema. No pallor.  Psychiatric: He has a normal mood and affect. His behavior is normal. Judgment and thought content normal.  Vitals reviewed.        Assessment & Plan:  Prostate cancer screening - Plan: PSA  Screening cholesterol level - Plan: CBC with Differential, COMPLETE METABOLIC PANEL WITH GFR, Lipid Panel  Physical exam today is completely normal.  Blood pressure is outstanding.  I will check a PSA to screen for prostate cancer.  Colonoscopy is up-to-date.  Check CBC, CMP, fasting lipid panel.  Immunizations are up-to-date.  Patient denies any falls, depression, or memory loss.  Patient request a refill on hyoscyamine that he uses sparingly for rectal spasms.  He also request Viagra that he uses occasionally for erectile dysfunction.  Both prescriptions were sent to his pharmacy.  Otherwise regular anticipatory guidance was provided.

## 2019-07-15 LAB — COMPLETE METABOLIC PANEL WITH GFR
AG Ratio: 2 (calc) (ref 1.0–2.5)
ALT: 22 U/L (ref 9–46)
AST: 16 U/L (ref 10–35)
Albumin: 4.5 g/dL (ref 3.6–5.1)
Alkaline phosphatase (APISO): 58 U/L (ref 35–144)
BUN: 19 mg/dL (ref 7–25)
CO2: 27 mmol/L (ref 20–32)
Calcium: 9.3 mg/dL (ref 8.6–10.3)
Chloride: 105 mmol/L (ref 98–110)
Creat: 0.91 mg/dL (ref 0.70–1.18)
GFR, Est African American: 99 mL/min/{1.73_m2} (ref >=60)
GFR, Est Non African American: 85 mL/min/{1.73_m2} (ref >=60)
Globulin: 2.2 g/dL (calc) (ref 1.9–3.7)
Glucose, Bld: 104 mg/dL — ABNORMAL HIGH (ref 65–99)
Potassium: 5.4 mmol/L — ABNORMAL HIGH (ref 3.5–5.3)
Sodium: 142 mmol/L (ref 135–146)
Total Bilirubin: 0.8 mg/dL (ref 0.2–1.2)
Total Protein: 6.7 g/dL (ref 6.1–8.1)

## 2019-07-15 LAB — PSA: PSA: 0.4 ng/mL (ref <=4.0)

## 2019-07-15 LAB — CBC WITH DIFFERENTIAL/PLATELET
Absolute Monocytes: 409 cells/uL (ref 200–950)
Basophils Absolute: 32 cells/uL (ref 0–200)
Basophils Relative: 0.7 %
Eosinophils Absolute: 41 cells/uL (ref 15–500)
Eosinophils Relative: 0.9 %
HCT: 48.8 % (ref 38.5–50.0)
Hemoglobin: 16.6 g/dL (ref 13.2–17.1)
Lymphs Abs: 1113 cells/uL (ref 850–3900)
MCH: 31.7 pg (ref 27.0–33.0)
MCHC: 34 g/dL (ref 32.0–36.0)
MCV: 93.3 fL (ref 80.0–100.0)
MPV: 10.1 fL (ref 7.5–12.5)
Monocytes Relative: 8.9 %
Neutro Abs: 3004 cells/uL (ref 1500–7800)
Neutrophils Relative %: 65.3 %
Platelets: 168 10*3/uL (ref 140–400)
RBC: 5.23 10*6/uL (ref 4.20–5.80)
RDW: 12.8 % (ref 11.0–15.0)
Total Lymphocyte: 24.2 %
WBC: 4.6 10*3/uL (ref 3.8–10.8)

## 2019-07-15 LAB — LIPID PANEL
Cholesterol: 166 mg/dL (ref <200)
HDL: 43 mg/dL (ref >=40)
LDL Cholesterol (Calc): 106 mg/dL (calc) — ABNORMAL HIGH
Non-HDL Cholesterol (Calc): 123 mg/dL (calc) (ref <130)
Total CHOL/HDL Ratio: 3.9 (calc) (ref <5.0)
Triglycerides: 77 mg/dL (ref <150)

## 2019-09-23 DIAGNOSIS — M5136 Other intervertebral disc degeneration, lumbar region: Secondary | ICD-10-CM | POA: Diagnosis not present

## 2019-09-23 DIAGNOSIS — M545 Low back pain: Secondary | ICD-10-CM | POA: Diagnosis not present

## 2019-09-23 DIAGNOSIS — M62830 Muscle spasm of back: Secondary | ICD-10-CM | POA: Diagnosis not present

## 2019-09-23 DIAGNOSIS — M9903 Segmental and somatic dysfunction of lumbar region: Secondary | ICD-10-CM | POA: Diagnosis not present

## 2019-09-30 DIAGNOSIS — M5136 Other intervertebral disc degeneration, lumbar region: Secondary | ICD-10-CM | POA: Diagnosis not present

## 2019-09-30 DIAGNOSIS — M62830 Muscle spasm of back: Secondary | ICD-10-CM | POA: Diagnosis not present

## 2019-09-30 DIAGNOSIS — M9903 Segmental and somatic dysfunction of lumbar region: Secondary | ICD-10-CM | POA: Diagnosis not present

## 2019-09-30 DIAGNOSIS — M545 Low back pain: Secondary | ICD-10-CM | POA: Diagnosis not present

## 2019-10-07 DIAGNOSIS — M9903 Segmental and somatic dysfunction of lumbar region: Secondary | ICD-10-CM | POA: Diagnosis not present

## 2019-10-07 DIAGNOSIS — M62830 Muscle spasm of back: Secondary | ICD-10-CM | POA: Diagnosis not present

## 2019-10-07 DIAGNOSIS — M5136 Other intervertebral disc degeneration, lumbar region: Secondary | ICD-10-CM | POA: Diagnosis not present

## 2019-10-07 DIAGNOSIS — M545 Low back pain: Secondary | ICD-10-CM | POA: Diagnosis not present

## 2019-10-14 DIAGNOSIS — M545 Low back pain: Secondary | ICD-10-CM | POA: Diagnosis not present

## 2019-10-14 DIAGNOSIS — M5136 Other intervertebral disc degeneration, lumbar region: Secondary | ICD-10-CM | POA: Diagnosis not present

## 2019-10-14 DIAGNOSIS — M62830 Muscle spasm of back: Secondary | ICD-10-CM | POA: Diagnosis not present

## 2019-10-14 DIAGNOSIS — M9903 Segmental and somatic dysfunction of lumbar region: Secondary | ICD-10-CM | POA: Diagnosis not present

## 2019-11-03 ENCOUNTER — Other Ambulatory Visit: Payer: Self-pay | Admitting: Family Medicine

## 2019-11-03 NOTE — Telephone Encounter (Signed)
Requesting refill    Ambien  LOV: 07/14/2019  LRF:  06/24/2019

## 2019-11-04 DIAGNOSIS — M9903 Segmental and somatic dysfunction of lumbar region: Secondary | ICD-10-CM | POA: Diagnosis not present

## 2019-11-04 DIAGNOSIS — M62830 Muscle spasm of back: Secondary | ICD-10-CM | POA: Diagnosis not present

## 2019-11-04 DIAGNOSIS — M545 Low back pain: Secondary | ICD-10-CM | POA: Diagnosis not present

## 2019-11-04 DIAGNOSIS — M5136 Other intervertebral disc degeneration, lumbar region: Secondary | ICD-10-CM | POA: Diagnosis not present

## 2019-11-04 MED ORDER — ZOLPIDEM TARTRATE 10 MG PO TABS: 5.0000 mg | ORAL_TABLET | Freq: Every evening | ORAL | 3 refills | Status: DC | PRN

## 2019-11-18 DIAGNOSIS — M62830 Muscle spasm of back: Secondary | ICD-10-CM | POA: Diagnosis not present

## 2019-11-18 DIAGNOSIS — M545 Low back pain: Secondary | ICD-10-CM | POA: Diagnosis not present

## 2019-11-18 DIAGNOSIS — M5136 Other intervertebral disc degeneration, lumbar region: Secondary | ICD-10-CM | POA: Diagnosis not present

## 2019-11-18 DIAGNOSIS — M9903 Segmental and somatic dysfunction of lumbar region: Secondary | ICD-10-CM | POA: Diagnosis not present

## 2019-11-24 DIAGNOSIS — L2084 Intrinsic (allergic) eczema: Secondary | ICD-10-CM | POA: Diagnosis not present

## 2019-11-24 DIAGNOSIS — D485 Neoplasm of uncertain behavior of skin: Secondary | ICD-10-CM | POA: Diagnosis not present

## 2019-11-24 DIAGNOSIS — L821 Other seborrheic keratosis: Secondary | ICD-10-CM | POA: Diagnosis not present

## 2019-11-24 DIAGNOSIS — L280 Lichen simplex chronicus: Secondary | ICD-10-CM | POA: Diagnosis not present

## 2019-11-24 DIAGNOSIS — L439 Lichen planus, unspecified: Secondary | ICD-10-CM | POA: Diagnosis not present

## 2019-11-24 DIAGNOSIS — L570 Actinic keratosis: Secondary | ICD-10-CM | POA: Diagnosis not present

## 2019-12-02 DIAGNOSIS — M545 Low back pain: Secondary | ICD-10-CM | POA: Diagnosis not present

## 2019-12-02 DIAGNOSIS — L570 Actinic keratosis: Secondary | ICD-10-CM | POA: Diagnosis not present

## 2019-12-02 DIAGNOSIS — M62830 Muscle spasm of back: Secondary | ICD-10-CM | POA: Diagnosis not present

## 2019-12-02 DIAGNOSIS — M9903 Segmental and somatic dysfunction of lumbar region: Secondary | ICD-10-CM | POA: Diagnosis not present

## 2019-12-02 DIAGNOSIS — M5136 Other intervertebral disc degeneration, lumbar region: Secondary | ICD-10-CM | POA: Diagnosis not present

## 2019-12-23 DIAGNOSIS — M5136 Other intervertebral disc degeneration, lumbar region: Secondary | ICD-10-CM | POA: Diagnosis not present

## 2019-12-23 DIAGNOSIS — M9903 Segmental and somatic dysfunction of lumbar region: Secondary | ICD-10-CM | POA: Diagnosis not present

## 2019-12-23 DIAGNOSIS — M62830 Muscle spasm of back: Secondary | ICD-10-CM | POA: Diagnosis not present

## 2019-12-23 DIAGNOSIS — M545 Low back pain: Secondary | ICD-10-CM | POA: Diagnosis not present

## 2019-12-31 DIAGNOSIS — L239 Allergic contact dermatitis, unspecified cause: Secondary | ICD-10-CM | POA: Diagnosis not present

## 2019-12-31 DIAGNOSIS — L578 Other skin changes due to chronic exposure to nonionizing radiation: Secondary | ICD-10-CM | POA: Diagnosis not present

## 2019-12-31 DIAGNOSIS — D0439 Carcinoma in situ of skin of other parts of face: Secondary | ICD-10-CM | POA: Diagnosis not present

## 2020-01-06 DIAGNOSIS — M5136 Other intervertebral disc degeneration, lumbar region: Secondary | ICD-10-CM | POA: Diagnosis not present

## 2020-01-06 DIAGNOSIS — M9903 Segmental and somatic dysfunction of lumbar region: Secondary | ICD-10-CM | POA: Diagnosis not present

## 2020-01-06 DIAGNOSIS — M545 Low back pain: Secondary | ICD-10-CM | POA: Diagnosis not present

## 2020-01-06 DIAGNOSIS — M62830 Muscle spasm of back: Secondary | ICD-10-CM | POA: Diagnosis not present

## 2020-02-10 DIAGNOSIS — M62830 Muscle spasm of back: Secondary | ICD-10-CM | POA: Diagnosis not present

## 2020-02-10 DIAGNOSIS — M5136 Other intervertebral disc degeneration, lumbar region: Secondary | ICD-10-CM | POA: Diagnosis not present

## 2020-02-10 DIAGNOSIS — M545 Low back pain: Secondary | ICD-10-CM | POA: Diagnosis not present

## 2020-02-10 DIAGNOSIS — M9903 Segmental and somatic dysfunction of lumbar region: Secondary | ICD-10-CM | POA: Diagnosis not present

## 2020-02-14 ENCOUNTER — Encounter: Payer: Self-pay | Admitting: Family Medicine

## 2020-02-14 ENCOUNTER — Ambulatory Visit (INDEPENDENT_AMBULATORY_CARE_PROVIDER_SITE_OTHER): Payer: BC Managed Care – PPO | Admitting: Family Medicine

## 2020-02-14 ENCOUNTER — Other Ambulatory Visit: Payer: Self-pay

## 2020-02-14 VITALS — BP 128/72 | HR 94 | Temp 97.0°F | Ht 73.5 in | Wt 257.0 lb

## 2020-02-14 DIAGNOSIS — R509 Fever, unspecified: Secondary | ICD-10-CM | POA: Diagnosis not present

## 2020-02-14 NOTE — Progress Notes (Signed)
Subjective:    Patient ID: Ryan Turner, male    DOB: 01/24/1951, 69 y.o.   MRN: [id number]  HPI Patient is a very pleasant 69 year old Caucasian male who presents with vague viral symptoms that have been more than a week in duration.  He reports fatigue.  He reports body aches.  He reports a dry nonproductive cough.  He reports soreness in his scalp.  He denies any change in his sense of smell or taste.  He denies any severe rhinorrhea.  He denies any sore throat.  He denies any nausea or vomiting or diarrhea.  He denies any dysuria.  He does complain of arthralgias and myalgias.  He denies any rash or tick bite.  Physical exam today is remarkable only for oxygen saturation of 93% Past Medical History:  Diagnosis Date  . Cataract 2014   removed  . DDD (degenerative disc disease), lumbar   . Diverticulosis   . Headache   . Hypertension   . IBS (irritable bowel syndrome)    Past Surgical History:  Procedure Laterality Date  . cataracts    . COLON SURGERY  1995   fistula  . COLONOSCOPY    . EYE SURGERY  2014   catarats/lens replaced  . mohs  2015   skin cancer from nose  . POLYPECTOMY    . VASECTOMY  1985   Current Outpatient Medications on File Prior to Visit  Medication Sig Dispense Refill  . celecoxib (CELEBREX) 200 MG capsule TAKE 1 CAPSULE (200 MG TOTAL) BY MOUTH AS NEEDED. 90 capsule 2  . docusate sodium (COLACE) 100 MG capsule Take 200 mg by mouth daily.    . fluticasone (FLONASE) 50 MCG/ACT nasal spray Place into both nostrils as needed for allergies or rhinitis.    . Glucosamine-Chondroit-Vit C-Mn (GLUCOSAMINE CHONDROITIN COMPLX) CAPS Take 2 capsules by mouth daily. 1800 mg    . hydrocortisone (PROCTOSOL HC) 2.5 % rectal cream Place 1 application rectally as needed for hemorrhoids or itching.    . hyoscyamine (LEVSIN) 0.125 MG tablet Take 1 tablet (0.125 mg total) by mouth every 4 (four) hours as needed. 30 tablet 0  . Multiple Vitamins-Minerals (OCUVITE PRESERVISION  PO) Take by mouth daily.    . Omega-3 Fatty Acids (FISH OIL) 1200 MG CAPS Take 2 capsules by mouth daily.    . propranolol ER (INDERAL LA) 80 MG 24 hr capsule Take 1 capsule (80 mg total) by mouth daily. (Patient not taking: Reported on 02/02/2019) 90 capsule 3  . sildenafil (VIAGRA) 100 MG tablet Take 0.5-1 tablets (50-100 mg total) by mouth daily as needed for erectile dysfunction. 5 tablet 11  . zolpidem (AMBIEN) 10 MG tablet Take 0.5-1 tablets (5-10 mg total) by mouth at bedtime as needed. for sleep 30 tablet 3   Current Facility-Administered Medications on File Prior to Visit  Medication Dose Route Frequency Provider Last Rate Last Admin  . 0.9 %  sodium chloride infusion  500 mL Intravenous Once Armbruster, Carlota Raspberry, MD       Allergies  Allergen Reactions  . Morphine     Other reaction(s): VOMITING   Social History   Socioeconomic History  . Marital status: Married    Spouse name: Not on file  . Number of children: Not on file  . Years of education: College  . Highest education level: Not on file  Occupational History  . Occupation: Teacher, music  Tobacco Use  . Smoking status: Current Some Day Smoker    Types: Cigars  .  Smokeless tobacco: Never Used  . Tobacco comment: quit cigarettes in 1973  Vaping Use  . Vaping Use: Never used  Substance and Sexual Activity  . Alcohol use: Yes    Alcohol/week: 6.0 standard drinks    Types: 2 Cans of beer, 4 Shots of liquor per week  . Drug use: No  . Sexual activity: Yes    Birth control/protection: None  Other Topics Concern  . Not on file  Social History Narrative   Lives at home with wife.   Social Determinants of Health   Financial Resource Strain:   . Difficulty of Paying Living Expenses:   Food Insecurity:   . Worried About Charity fundraiser in the Last Year:   . Arboriculturist in the Last Year:   Transportation Needs:   . Film/video editor (Medical):   Marland Kitchen Lack of Transportation (Non-Medical):   Physical Activity:    . Days of Exercise per Week:   . Minutes of Exercise per Session:   Stress:   . Feeling of Stress :   Social Connections:   . Frequency of Communication with Friends and Family:   . Frequency of Social Gatherings with Friends and Family:   . Attends Religious Services:   . Active Member of Clubs or Organizations:   . Attends Archivist Meetings:   Marland Kitchen Marital Status:   Intimate Partner Violence:   . Fear of Current or Ex-Partner:   . Emotionally Abused:   Marland Kitchen Physically Abused:   . Sexually Abused:       Review of Systems  All other systems reviewed and are negative.      Objective:   Physical Exam Vitals reviewed.  Constitutional:      General: He is not in acute distress.    Appearance: Normal appearance. He is normal weight. He is not ill-appearing or toxic-appearing.  HENT:     Right Ear: Tympanic membrane and ear canal normal.     Left Ear: Tympanic membrane and ear canal normal.     Nose: Nose normal. No congestion or rhinorrhea.     Mouth/Throat:     Pharynx: No oropharyngeal exudate or posterior oropharyngeal erythema.  Eyes:     General:        Right eye: No discharge.        Left eye: No discharge.     Conjunctiva/sclera: Conjunctivae normal.  Cardiovascular:     Rate and Rhythm: Normal rate and regular rhythm.     Pulses: Normal pulses.     Heart sounds: Normal heart sounds. No murmur heard.  No friction rub.  Pulmonary:     Effort: Pulmonary effort is normal. No respiratory distress.     Breath sounds: Normal breath sounds. No wheezing, rhonchi or rales.  Abdominal:     General: There is no distension.     Palpations: Abdomen is soft.     Tenderness: There is no abdominal tenderness. There is no guarding.  Musculoskeletal:     Cervical back: Neck supple.  Lymphadenopathy:     Cervical: No cervical adenopathy.  Skin:    Findings: No rash.  Neurological:     General: No focal deficit present.     Mental Status: He is alert and oriented  to person, place, and time.     Cranial Nerves: No cranial nerve deficit.           Assessment & Plan:  Fever and chills - Plan: SARS-COV-2 RNA,(COVID-19) QUAL  NAAT  Patient has symptoms consistent with a viral syndrome.  Given the current situation I suspect COVID-19.  His wife has similar symptoms.  I have screened the patient today for COVID-19.  I recommended that he quarantine a total of 2 weeks from symptom onset.  Await the results.  Recommended vitamin C, vitamin D, and zinc.  I recommended that he go to the emergency room if you develop worsening shortness of breath or any chest pain.  Otherwise recommended supportive care and quarantine to avoid spread

## 2020-02-15 LAB — SARS-COV-2 RNA,(COVID-19) QUALITATIVE NAAT: SARS CoV2 RNA: DETECTED — AB

## 2020-02-16 ENCOUNTER — Encounter: Payer: Self-pay | Admitting: Family Medicine

## 2020-02-20 ENCOUNTER — Other Ambulatory Visit: Payer: Self-pay

## 2020-02-20 ENCOUNTER — Emergency Department (HOSPITAL_COMMUNITY): Payer: BC Managed Care – PPO

## 2020-02-20 ENCOUNTER — Encounter (HOSPITAL_COMMUNITY): Payer: Self-pay | Admitting: Emergency Medicine

## 2020-02-20 ENCOUNTER — Inpatient Hospital Stay (HOSPITAL_COMMUNITY)
Admission: EM | Admit: 2020-02-20 | Discharge: 2020-03-03 | DRG: 177 | Disposition: A | Discharge status: Home Health | Payer: BC Managed Care – PPO | Attending: Internal Medicine | Admitting: Internal Medicine

## 2020-02-20 DIAGNOSIS — F17290 Nicotine dependence, other tobacco product, uncomplicated: Secondary | ICD-10-CM | POA: Diagnosis not present

## 2020-02-20 DIAGNOSIS — R0902 Hypoxemia: Secondary | ICD-10-CM

## 2020-02-20 DIAGNOSIS — Z720 Tobacco use: Secondary | ICD-10-CM | POA: Diagnosis not present

## 2020-02-20 DIAGNOSIS — U071 COVID-19: Secondary | ICD-10-CM | POA: Diagnosis not present

## 2020-02-20 DIAGNOSIS — I82442 Acute embolism and thrombosis of left tibial vein: Secondary | ICD-10-CM | POA: Diagnosis present

## 2020-02-20 DIAGNOSIS — E861 Hypovolemia: Secondary | ICD-10-CM | POA: Diagnosis not present

## 2020-02-20 DIAGNOSIS — Z79899 Other long term (current) drug therapy: Secondary | ICD-10-CM | POA: Diagnosis not present

## 2020-02-20 DIAGNOSIS — I82452 Acute embolism and thrombosis of left peroneal vein: Secondary | ICD-10-CM | POA: Diagnosis present

## 2020-02-20 DIAGNOSIS — R0602 Shortness of breath: Secondary | ICD-10-CM

## 2020-02-20 DIAGNOSIS — D6959 Other secondary thrombocytopenia: Secondary | ICD-10-CM | POA: Diagnosis not present

## 2020-02-20 DIAGNOSIS — R7989 Other specified abnormal findings of blood chemistry: Secondary | ICD-10-CM | POA: Diagnosis not present

## 2020-02-20 DIAGNOSIS — K589 Irritable bowel syndrome without diarrhea: Secondary | ICD-10-CM | POA: Diagnosis not present

## 2020-02-20 DIAGNOSIS — E872 Acidosis: Secondary | ICD-10-CM | POA: Diagnosis not present

## 2020-02-20 DIAGNOSIS — E875 Hyperkalemia: Secondary | ICD-10-CM | POA: Diagnosis not present

## 2020-02-20 DIAGNOSIS — I517 Cardiomegaly: Secondary | ICD-10-CM | POA: Diagnosis not present

## 2020-02-20 DIAGNOSIS — E669 Obesity, unspecified: Secondary | ICD-10-CM | POA: Diagnosis not present

## 2020-02-20 DIAGNOSIS — J9601 Acute respiratory failure with hypoxia: Secondary | ICD-10-CM | POA: Diagnosis not present

## 2020-02-20 DIAGNOSIS — E871 Hypo-osmolality and hyponatremia: Secondary | ICD-10-CM | POA: Diagnosis not present

## 2020-02-20 DIAGNOSIS — J80 Acute respiratory distress syndrome: Secondary | ICD-10-CM | POA: Diagnosis not present

## 2020-02-20 DIAGNOSIS — Z66 Do not resuscitate: Secondary | ICD-10-CM | POA: Diagnosis not present

## 2020-02-20 DIAGNOSIS — I10 Essential (primary) hypertension: Secondary | ICD-10-CM | POA: Diagnosis present

## 2020-02-20 DIAGNOSIS — Z6833 Body mass index (BMI) 33.0-33.9, adult: Secondary | ICD-10-CM

## 2020-02-20 DIAGNOSIS — J1282 Pneumonia due to coronavirus disease 2019: Secondary | ICD-10-CM | POA: Diagnosis not present

## 2020-02-20 DIAGNOSIS — K5790 Diverticulosis of intestine, part unspecified, without perforation or abscess without bleeding: Secondary | ICD-10-CM | POA: Diagnosis not present

## 2020-02-20 DIAGNOSIS — Z85828 Personal history of other malignant neoplasm of skin: Secondary | ICD-10-CM | POA: Diagnosis not present

## 2020-02-20 DIAGNOSIS — R05 Cough: Secondary | ICD-10-CM | POA: Diagnosis not present

## 2020-02-20 LAB — COMPREHENSIVE METABOLIC PANEL
ALT: 52 U/L — ABNORMAL HIGH (ref 0–44)
AST: 84 U/L — ABNORMAL HIGH (ref 15–41)
Albumin: 2.5 g/dL — ABNORMAL LOW (ref 3.5–5.0)
Alkaline Phosphatase: 118 U/L (ref 38–126)
Anion gap: 12 (ref 5–15)
BUN: 12 mg/dL (ref 8–23)
CO2: 29 mmol/L (ref 22–32)
Calcium: 8 mg/dL — ABNORMAL LOW (ref 8.9–10.3)
Chloride: 90 mmol/L — ABNORMAL LOW (ref 98–111)
Creatinine, Ser: 0.89 mg/dL (ref 0.61–1.24)
GFR calc Af Amer: >60 mL/min (ref >60)
GFR calc non Af Amer: >60 mL/min (ref >60)
Glucose, Bld: 134 mg/dL — ABNORMAL HIGH (ref 70–99)
Potassium: 3.6 mmol/L (ref 3.5–5.1)
Sodium: 131 mmol/L — ABNORMAL LOW (ref 135–145)
Total Bilirubin: 1 mg/dL (ref 0.3–1.2)
Total Protein: 5.8 g/dL — ABNORMAL LOW (ref 6.5–8.1)

## 2020-02-20 LAB — LACTIC ACID, PLASMA
Lactic Acid, Venous: 1.9 mmol/L (ref 0.5–1.9)
Lactic Acid, Venous: 1.4 mmol/L (ref 0.5–1.9)

## 2020-02-20 LAB — CBC WITH DIFFERENTIAL/PLATELET
Abs Immature Granulocytes: 0.12 10*3/uL — ABNORMAL HIGH (ref 0.00–0.07)
Basophils Absolute: 0 10*3/uL (ref 0.0–0.1)
Basophils Relative: 0 %
Eosinophils Absolute: 0 10*3/uL (ref 0.0–0.5)
Eosinophils Relative: 0 %
HCT: 44.2 % (ref 39.0–52.0)
Hemoglobin: 15 g/dL (ref 13.0–17.0)
Immature Granulocytes: 2 %
Lymphocytes Relative: 7 %
Lymphs Abs: 0.5 10*3/uL — ABNORMAL LOW (ref 0.7–4.0)
MCH: 31.2 pg (ref 26.0–34.0)
MCHC: 33.9 g/dL (ref 30.0–36.0)
MCV: 91.9 fL (ref 80.0–100.0)
Monocytes Absolute: 0.4 10*3/uL (ref 0.1–1.0)
Monocytes Relative: 6 %
Neutro Abs: 5.6 10*3/uL (ref 1.7–7.7)
Neutrophils Relative %: 85 %
Platelets: 200 10*3/uL (ref 150–400)
RBC: 4.81 MIL/uL (ref 4.22–5.81)
RDW: 12.7 % (ref 11.5–15.5)
WBC: 6.5 10*3/uL (ref 4.0–10.5)
nRBC: 0 % (ref 0.0–0.2)

## 2020-02-20 LAB — D-DIMER, QUANTITATIVE: D-Dimer, Quant: 5.37 ug/mL-FEU — ABNORMAL HIGH (ref 0.00–0.50)

## 2020-02-20 LAB — CREATININE, SERUM
Creatinine, Ser: 0.87 mg/dL (ref 0.61–1.24)
GFR calc Af Amer: >60 mL/min (ref >60)
GFR calc non Af Amer: >60 mL/min (ref >60)

## 2020-02-20 LAB — CBC
HCT: 44.2 % (ref 39.0–52.0)
Hemoglobin: 15.2 g/dL (ref 13.0–17.0)
MCH: 31.2 pg (ref 26.0–34.0)
MCHC: 34.4 g/dL (ref 30.0–36.0)
MCV: 90.8 fL (ref 80.0–100.0)
Platelets: 193 10*3/uL (ref 150–400)
RBC: 4.87 MIL/uL (ref 4.22–5.81)
RDW: 12.7 % (ref 11.5–15.5)
WBC: 6.8 10*3/uL (ref 4.0–10.5)
nRBC: 0 % (ref 0.0–0.2)

## 2020-02-20 LAB — FIBRINOGEN: Fibrinogen: 667 mg/dL — ABNORMAL HIGH (ref 210–475)

## 2020-02-20 LAB — LACTATE DEHYDROGENASE: LDH: 559 U/L — ABNORMAL HIGH (ref 98–192)

## 2020-02-20 MED ORDER — SODIUM CHLORIDE 0.9 % IV SOLN: INTRAVENOUS | Status: DC

## 2020-02-20 MED ORDER — DOCUSATE SODIUM 100 MG PO CAPS
200.0000 mg | ORAL_CAPSULE | Freq: Every day | ORAL | Status: DC
  Administered 2020-02-21 – 2020-03-03 (×12): 200 mg via ORAL
  Filled 2020-02-20 (×14): qty 2

## 2020-02-20 MED ORDER — POLYETHYLENE GLYCOL 3350 17 G PO PACK
17.0000 g | PACK | Freq: Every day | ORAL | Status: DC | PRN
  Administered 2020-02-28: 17 g via ORAL
  Filled 2020-02-20: qty 1

## 2020-02-20 MED ORDER — LACTATED RINGERS IV BOLUS
500.0000 mL | Freq: Once | INTRAVENOUS | Status: AC
  Administered 2020-02-20: 500 mL via INTRAVENOUS

## 2020-02-20 MED ORDER — ZOLPIDEM TARTRATE 5 MG PO TABS: 5.0000 mg | ORAL_TABLET | Freq: Every evening | ORAL | Status: DC | PRN

## 2020-02-20 MED ORDER — HYOSCYAMINE SULFATE 0.125 MG PO TABS: 0.1250 mg | ORAL_TABLET | ORAL | Status: DC | PRN

## 2020-02-20 MED ORDER — HEPARIN SODIUM (PORCINE) 5000 UNIT/ML IJ SOLN
5000.0000 [IU] | Freq: Three times a day (TID) | INTRAMUSCULAR | Status: DC
  Administered 2020-02-20 – 2020-02-21 (×2): 5000 [IU] via SUBCUTANEOUS
  Filled 2020-02-20 (×2): qty 1

## 2020-02-20 MED ORDER — DEXAMETHASONE 4 MG PO TABS: 6.0000 mg | ORAL_TABLET | ORAL | Status: DC

## 2020-02-20 MED ORDER — PROPRANOLOL HCL ER 80 MG PO CP24
80.0000 mg | ORAL_CAPSULE | Freq: Every day | ORAL | Status: DC
  Administered 2020-02-21 – 2020-03-02 (×11): 80 mg via ORAL
  Filled 2020-02-20 (×11): qty 1

## 2020-02-20 MED ORDER — IPRATROPIUM-ALBUTEROL 20-100 MCG/ACT IN AERS
1.0000 | INHALATION_SPRAY | Freq: Four times a day (QID) | RESPIRATORY_TRACT | Status: DC
  Administered 2020-02-21 – 2020-03-03 (×47): 1 via RESPIRATORY_TRACT
  Filled 2020-02-20: qty 4

## 2020-02-20 MED ORDER — CELECOXIB 200 MG PO CAPS
200.0000 mg | ORAL_CAPSULE | Freq: Every day | ORAL | Status: DC | PRN
  Filled 2020-02-20: qty 1

## 2020-02-20 MED ORDER — SODIUM CHLORIDE 0.9 % IV SOLN
200.0000 mg | Freq: Once | INTRAVENOUS | Status: AC
  Administered 2020-02-20: 200 mg via INTRAVENOUS
  Filled 2020-02-20: qty 40

## 2020-02-20 MED ORDER — DEXAMETHASONE SODIUM PHOSPHATE 10 MG/ML IJ SOLN
10.0000 mg | Freq: Once | INTRAMUSCULAR | Status: AC
  Administered 2020-02-20: 10 mg via INTRAVENOUS
  Filled 2020-02-20: qty 1

## 2020-02-20 MED ORDER — ACETAMINOPHEN 325 MG PO TABS
650.0000 mg | ORAL_TABLET | Freq: Four times a day (QID) | ORAL | Status: DC | PRN
  Administered 2020-02-25 – 2020-03-03 (×8): 650 mg via ORAL
  Filled 2020-02-20 (×8): qty 2

## 2020-02-20 MED ORDER — SODIUM CHLORIDE 0.9 % IV SOLN
100.0000 mg | Freq: Every day | INTRAVENOUS | Status: AC
  Administered 2020-02-21 – 2020-02-24 (×4): 100 mg via INTRAVENOUS
  Filled 2020-02-20 (×4): qty 20

## 2020-02-20 MED ORDER — ADULT MULTIVITAMIN W/MINERALS CH
1.0000 | ORAL_TABLET | Freq: Every day | ORAL | Status: DC
  Administered 2020-02-21 – 2020-03-03 (×12): 1 via ORAL
  Filled 2020-02-20 (×12): qty 1

## 2020-02-20 MED ORDER — GUAIFENESIN-DM 100-10 MG/5ML PO SYRP
10.0000 mL | ORAL_SOLUTION | ORAL | Status: DC | PRN
  Administered 2020-03-01 (×2): 10 mL via ORAL
  Filled 2020-02-20 (×2): qty 10

## 2020-02-20 MED ORDER — ACETAMINOPHEN 325 MG PO TABS
650.0000 mg | ORAL_TABLET | Freq: Once | ORAL | Status: AC
  Administered 2020-02-20: 650 mg via ORAL
  Filled 2020-02-20: qty 2

## 2020-02-20 MED ORDER — FLUTICASONE PROPIONATE 50 MCG/ACT NA SUSP
2.0000 | NASAL | Status: DC | PRN
  Filled 2020-02-20: qty 16

## 2020-02-20 MED ORDER — ALBUTEROL SULFATE HFA 108 (90 BASE) MCG/ACT IN AERS
2.0000 | INHALATION_SPRAY | RESPIRATORY_TRACT | Status: DC | PRN
  Filled 2020-02-20: qty 6.7

## 2020-02-20 NOTE — ED Provider Notes (Signed)
Care received from Dr. Ron Parker at shift change.  Please see his note for full HPI.  In short, 69 year old male with a history of hypertension, but no other significant medical history presents to the ER with a positive Covid test on Monday, worsening shortness of breath over the last few days.  He was noted to be hypoxic with his O2 sats in the 80s on EMS arrival.  Arrived with O2 sats of 88% on a nonrebreather.  Patient sats improved here in the ED to the low 90s.  Dr. Ron Parker ordered heated high flow nasal cannula, IV steroids remdesivir given.  Signout received with follow-up on chest x-ray and consult to hospitalist for admission.  Chest x-ray with moderate to advanced bilateral opacities consistent with COVID-19.  EKG with normal sinus rhythm, CMP with normal renal function, mildly elevated LFTs.  No significant electrolyte delay arrangements.  Normal anion gap.  CBC without leukocytosis.  Initial lactic negative.  Consulted [facility] for admission and further management.  Overall, the patient has been hemodynamically stable here in the ED.  Physical Exam  BP (!) 143/87   Pulse 80   Temp 99.2 F (37.3 C) (Oral)   Resp (!) 26   SpO2 91%   Physical Exam Vitals and nursing note reviewed.  Constitutional:      Appearance: He is well-developed.  HENT:     Head: Normocephalic and atraumatic.  Eyes:     Conjunctiva/sclera: Conjunctivae normal.  Cardiovascular:     Rate and Rhythm: Normal rate and regular rhythm.     Heart sounds: No murmur heard.   Pulmonary:     Effort: Tachypnea, accessory muscle usage and respiratory distress present.     Breath sounds: Rhonchi present.  Abdominal:     Palpations: Abdomen is soft.     Tenderness: There is no abdominal tenderness.  Musculoskeletal:        General: Normal range of motion.     Cervical back: Neck supple.  Skin:    General: Skin is warm and dry.  Neurological:     General: No focal deficit present.     Mental Status: He  is alert.  Psychiatric:        Mood and Affect: Mood normal.        Behavior: Behavior normal.     ED Course/Procedures     Procedures  MDM         Garald Balding, PA-C 02/20/20 2134    Breck Coons, MD 02/23/20 [phone]

## 2020-02-20 NOTE — ED Provider Notes (Signed)
[facility] EM[facility]ENC[facility] DEPARTMENT Provider Note   CSN: [id number] Arrival date & time: 02/20/20  1948     History Chief Complaint  Patient presents with  . Shortness of Breath    Ryan Turner is a 69 y.o. male.   Shortness of Breath Severity:  Severe Onset quality:  Gradual Timing:  Constant Progression:  Worsening Chronicity:  New Context comment:  Covid positive  Relieved by:  Nothing Worsened by:  Nothing Ineffective treatments:  None tried Associated symptoms: cough and fever   Associated symptoms: no chest pain, no headaches, no hemoptysis, no rash, no syncope and no vomiting        Past Medical History:  Diagnosis Date  . Cataract 2014   removed  . DDD (degenerative disc disease), lumbar   . Diverticulosis   . Headache   . Hypertension   . IBS (irritable bowel syndrome)     Patient Active Problem List   Diagnosis Date Noted  . Persistent headaches 05/15/2017  . DDD (degenerative disc disease), lumbar   . Diverticulosis   . Cataract   . Hypertension     Past Surgical History:  Procedure Laterality Date  . cataracts    . COLON SURGERY  1995   fistula  . COLONOSCOPY    . EYE SURGERY  2014   catarats/lens replaced  . mohs  2015   skin cancer from nose  . POLYPECTOMY    . VASECTOMY  1985       Family History  Problem Relation Age of Onset  . Cancer Mother        lung (unknown primary)  . Cancer Father        lung mets to spine  . Cancer Brother        melanoma  . Leukemia Maternal Grandfather   . Stomach cancer Paternal Grandmother   . Colon cancer Neg Hx   . Colon polyps Neg Hx   . Diabetes Neg Hx   . Esophageal cancer Neg Hx   . Rectal cancer Neg Hx     Social History   Tobacco Use  . Smoking status: Current Some Day Smoker    Types: Cigars  . Smokeless tobacco: Never Used  . Tobacco comment: quit cigarettes in 1973  Vaping Use  . Vaping Use: Never used  Substance Use Topics  . Alcohol use: Yes     Alcohol/week: 6.0 standard drinks    Types: 2 Cans of beer, 4 Shots of liquor per week  . Drug use: No    Home Medications Prior to Admission medications   Medication Sig Start Date End Date Taking? Authorizing Provider  celecoxib (CELEBREX) 200 MG capsule TAKE 1 CAPSULE (200 MG TOTAL) BY MOUTH AS NEEDED. 01/04/19   [name], MD  docusate sodium (COLACE) 100 MG capsule Take 200 mg by mouth daily.    [provider]  fluticasone (FLONASE) 50 MCG/ACT nasal spray Place into both nostrils as needed for allergies or rhinitis.    [provider]  Glucosamine-Chondroit-Vit C-Mn (GLUCOSAMINE CHONDROITIN COMPLX) CAPS Take 2 capsules by mouth daily. 1800 mg    [provider]  hydrocortisone (PROCTOSOL HC) 2.5 % rectal cream Place 1 application rectally as needed for hemorrhoids or itching.    [provider]  hyoscyamine (LEVSIN) 0.125 MG tablet Take 1 tablet (0.125 mg total) by mouth every 4 (four) hours as needed. 07/14/19   [name], MD  Multiple Vitamins-Minerals (OCUVITE PRESERVISION PO) Take by  mouth daily.    [provider]  Omega-3 Fatty Acids (FISH OIL) 1200 MG CAPS Take 2 capsules by mouth daily.    [provider]  propranolol ER (INDERAL LA) 80 MG 24 hr capsule Take 1 capsule (80 mg total) by mouth daily. Patient not taking: Reported on 02/02/2019 08/04/18   [name], MD  sildenafil (VIAGRA) 100 MG tablet Take 0.5-1 tablets (50-100 mg total) by mouth daily as needed for erectile dysfunction. 07/14/19   [name], MD  zolpidem (AMBIEN) 10 MG tablet Take 0.5-1 tablets (5-10 mg total) by mouth at bedtime as needed. for sleep 11/04/19   [name], MD    Allergies    Morphine  Review of Systems   Review of Systems  Constitutional: Positive for chills, fatigue and fever.  HENT: Negative for congestion and rhinorrhea.   Respiratory: Positive for cough and shortness of breath. Negative for  hemoptysis.   Cardiovascular: Negative for chest pain, palpitations and syncope.  Gastrointestinal: Negative for diarrhea, nausea and vomiting.  Genitourinary: Negative for difficulty urinating and dysuria.  Musculoskeletal: Negative for arthralgias and back pain.  Skin: Negative for color change and rash.  Neurological: Negative for light-headedness and headaches.    Physical Exam Updated Vital Signs BP (!) 143/87   Pulse 80   Temp 99.2 F (37.3 C) (Oral)   Resp (!) 26   SpO2 91%   Physical Exam Vitals and nursing note reviewed.  Constitutional:      General: He is not in acute distress.    Appearance: Normal appearance.  HENT:     Head: Normocephalic and atraumatic.     Nose: No rhinorrhea.  Eyes:     General:        Right eye: No discharge.        Left eye: No discharge.     Conjunctiva/sclera: Conjunctivae normal.  Cardiovascular:     Rate and Rhythm: Normal rate and regular rhythm.  Pulmonary:     Effort: Tachypnea, accessory muscle usage and respiratory distress present.     Breath sounds: No stridor. Rhonchi (throughout) present. No decreased breath sounds.  Chest:     Chest wall: No tenderness.  Abdominal:     General: Abdomen is flat. There is no distension.     Palpations: Abdomen is soft.  Musculoskeletal:        General: No deformity or signs of injury.  Skin:    General: Skin is warm and dry.  Neurological:     General: No focal deficit present.     Mental Status: He is alert. Mental status is at baseline.     Motor: No weakness.  Psychiatric:        Mood and Affect: Mood normal.        Behavior: Behavior normal.        Thought Content: Thought content normal.     ED Results / Procedures / Treatments   Labs (all labs ordered are listed, but only abnormal results are displayed) Labs Reviewed  CBC WITH DIFFERENTIAL/PLATELET - Abnormal; Notable for the following components:      Result Value   Lymphs Abs 0.5 (*)    Abs Immature Granulocytes  0.12 (*)    All other components within normal limits  COMPREHENSIVE METABOLIC PANEL - Abnormal; Notable for the following components:   Sodium 131 (*)    Chloride 90 (*)    Glucose, Bld 134 (*)    Calcium 8.0 (*)  Total Protein 5.8 (*)    Albumin 2.5 (*)    AST 84 (*)    ALT 52 (*)    All other components within normal limits  CULTURE, BLOOD (ROUTINE X 2)  CULTURE, BLOOD (ROUTINE X 2)  LACTIC ACID, PLASMA  LACTIC ACID, PLASMA    EKG EKG Interpretation  Date/Time:  Sunday February 20 2020 19:51:06 EDT Ventricular Rate:  92 PR Interval:    QRS Duration: 101 QT Interval:  355 QTC Calculation: 440 R Axis:   -36 Text Interpretation: Sinus rhythm Probable left atrial enlargement Left axis deviation Low voltage, precordial leads Confirmed by Dewaine Conger [phone]) on 02/20/2020 8:12:10 PM   Radiology No results found.  Procedures .Critical Care Performed by: Breck Coons, MD Authorized by: Breck Coons, MD   Critical care provider statement:    Critical care time (minutes):  45   Critical care was necessary to treat or prevent imminent or life-threatening deterioration of the following conditions:  Respiratory failure   Critical care was time spent personally by me on the following activities:  Discussions with consultants, evaluation of patient's response to treatment, examination of patient, ordering and performing treatments and interventions, ordering and review of laboratory studies, ordering and review of radiographic studies, pulse oximetry, re-evaluation of patient's condition, obtaining history from patient or surrogate, review of old charts and blood draw for specimens   (including critical care time)  Medications Ordered in ED Medications  remdesivir 200 mg in sodium chloride 0.9% 250 mL IVPB (has no administration in time range)    Followed by  remdesivir 100 mg in sodium chloride 0.9 % 100 mL IVPB (has no administration in time range)  acetaminophen (TYLENOL)  tablet 650 mg (has no administration in time range)  dexamethasone (DECADRON) injection 10 mg (10 mg Intravenous Given 02/20/20 2018)  lactated ringers bolus 500 mL (0 mLs Intravenous Stopped 02/20/20 2103)    ED Course  I have reviewed the triage vital signs and the nursing notes.  Pertinent labs & imaging results that were available during my care of the patient were reviewed by me and considered in my medical decision making (see chart for details).    MDM Rules/Calculators/A&P                          Known Covid positive.  Worsening shortness of breath over the last several days.  Today EMS was called for respiratory distress.  Initial O2 sats were low, they were improved with a nonrebreather.  Patient is feeling better.  O2 sats are in the upper 80s low 90s.  He is tachypneic tachycardic and using accessory muscles to breathe.  We will switch him over to heated high flow nasal cannula and give IV steroids and IV remdesivir.  He will get chest x-ray to evaluate for underlying pneumonia.  Here he is afebrile with otherwise normal vital signs.  Basic laboratory analysis will be done.  EKG was done shows sinus tachycardia without acute ischemic change interval abnormality or arrhythmia otherwise.  Awaiting chest imaging.  Will need admission, do not feel without focal findings on chest x-ray that he needs antibiotics at this time however if the chest x-ray results look different we will give antibiotics.  Patient is on high flow nasal cannula and hopefully can be transitioned down but will require admission.  Pt care was handed off to on coming provider at 2100.  Complete history and physical and current plan  have been communicated.  Please refer to their note for the remainder of ED care and ultimate disposition.   CRITICAL CARE Performed by: Breck Coons   Total critical care time: 40 minutes  Critical care time was exclusive of separately billable procedures and treating other  patients.  Critical care was necessary to treat or prevent imminent or life-threatening deterioration.  Critical care was time spent personally by me on the following activities: development of treatment plan with patient and/or surrogate as well as nursing, discussions with consultants, evaluation of patient's response to treatment, examination of patient, obtaining history from patient or surrogate, ordering and performing treatments and interventions, ordering and review of laboratory studies, ordering and review of radiographic studies, pulse oximetry and re-evaluation of patient's condition.   Final Clinical Impression(s) / ED Diagnoses Final diagnoses:  COVID-19  Hypoxia    Rx / DC Orders ED Discharge Orders    None       Breck Coons, MD 02/20/20 2105

## 2020-02-20 NOTE — ED Triage Notes (Addendum)
Patient tested positive for Covid on Monday.  Had been doing fine with OTC meds until today.  He had a sharp increase of shortness of breath today, oxygen sats with EMS were 48% on RA.  No chest pain.  Patient is CAOx4, no fever.  Patient has non productive cough, loss of taste and smell.

## 2020-02-20 NOTE — H&P (Signed)
TRH H&P    Patient Demographics:    Ryan Turner, is a 69 y.o. male  MRN: [id number]  DOB - 14-Jan-1951  Admit Date - 02/20/2020  Referring MD/NP/PA: PA Verdis Frederickson  Outpatient Primary MD for the patient is [name], MD  Patient coming from: Home via EMS  Chief complaint- Dyspnea   HPI:    Ryan Turner  is a 69 y.o. male, IBS, hypertension, diverticulosis, degenerative disc disease, cataracts, enlarged presents to the ER via EMS with chief complaint of shortness of breath.  Patient reported that he was sitting on the couch when he had sudden onset of shortness of breath.  His wife called EMS.  When EMS arrived he was saturating at 48% on room air.  He reports that he felt lightheaded, but did not pass out.  He also admits to generalized weakness.  In the ED patient has required 15 L high flow nasal cannula with nonrebreather to maintain oxygen saturation of 90%.  Patient was initially diagnosed on July 26 at an outpatient clinic.  Patient was seen that day for fever, chills, viral syndrome.  Physician was suspicious of COVID-19 patient was tested.  This came back positive.  Patient was not vaccinated for COVID-19.  Patient sent a note to his doctor on the 28th reporting that he felt better and asking if he can come out of quarantine.  Patient tells me that since that time he has continued to feel worse.  He reports myalgias in his back, fatigue, cough productive of beige sputum, sinus congestion, rhinorrhea with blood-tinged mucus.  Patient is not sure if he is continued to be febrile.  Patient reports that he did quarantine, as he was advised by his primary care physician.  No prescription medications were given at that appointment, but physician did advise using vitamin C, D, and zinc.  Patient reports that these over-the-counter medications did not offer relief.  No further complaints at this time.  During  exam, patient is able to speak in 3-4 word phrases.  He denies a stop taking breath.  There is accessory muscle use to breathe.  Patient is saturating at 90%, and reports that he feels about 20% better than he did at home.  ED course Temp 99.2, blood pressure 143/87, heart rate 80, respiratory rate 26, saturating at 90 to 91%. White blood cell count 6.5, hemoglobin 15 CHEM panel shows mild hyponatremia, mild hyperglycemia, otherwise unremarkable AST 84, ALT 52, lactic acid 1.9 Blood cultures pending EKG heart rate 92, sinus rhythm, QTC 440 Decadron started remdesivir started Tylenol given, bolus 500 ml Chest x-ray shows moderate to advanced opacities in the mid lower lung zones consistent with Covid pneumonia    Review of systems:    Review of Systems  Constitutional: Positive for chills, fever and malaise/fatigue.  HENT: Positive for congestion, nosebleeds, sinus pain and sore throat. Negative for ear discharge, hearing loss and tinnitus.   Eyes: Negative for blurred vision and double vision.  Respiratory: Positive for cough, sputum production, shortness of breath and wheezing. Negative for  hemoptysis.   Cardiovascular: Negative for chest pain, palpitations and leg swelling.  Gastrointestinal: Positive for nausea. Negative for abdominal pain, constipation, diarrhea and vomiting.  Genitourinary: Negative for dysuria and urgency.  Musculoskeletal: Positive for back pain and myalgias.  Neurological: Positive for weakness. Negative for dizziness and loss of consciousness.  Endo/Heme/Allergies: Negative for polydipsia.  Psychiatric/Behavioral: Negative for substance abuse.    All other systems reviewed and are negative.    Past History of the following :    Past Medical History:  Diagnosis Date  . Cataract 2014   removed  . DDD (degenerative disc disease), lumbar   . Diverticulosis   . Headache   . Hypertension   . IBS (irritable bowel syndrome)       Past Surgical History:   Procedure Laterality Date  . cataracts    . COLON SURGERY  1995   fistula  . COLONOSCOPY    . EYE SURGERY  2014   catarats/lens replaced  . mohs  2015   skin cancer from nose  . POLYPECTOMY    . VASECTOMY  1985      Social History:      Social History   Tobacco Use  . Smoking status: Current Some Day Smoker    Types: Cigars  . Smokeless tobacco: Never Used  . Tobacco comment: quit cigarettes in 1973  Substance Use Topics  . Alcohol use: Yes    Alcohol/week: 6.0 standard drinks    Types: 2 Cans of beer, 4 Shots of liquor per week       Family History :     Family History  Problem Relation Age of Onset  . Cancer Mother        lung (unknown primary)  . Cancer Father        lung mets to spine  . Cancer Brother        melanoma  . Leukemia Maternal Grandfather   . Stomach cancer Paternal Grandmother   . Colon cancer Neg Hx   . Colon polyps Neg Hx   . Diabetes Neg Hx   . Esophageal cancer Neg Hx   . Rectal cancer Neg Hx    Reviewed   Home Medications:   Prior to Admission medications   Medication Sig Start Date End Date Taking? Authorizing Provider  celecoxib (CELEBREX) 200 MG capsule TAKE 1 CAPSULE (200 MG TOTAL) BY MOUTH AS NEEDED. 01/04/19   [name], MD  docusate sodium (COLACE) 100 MG capsule Take 200 mg by mouth daily.    [provider]  fluticasone (FLONASE) 50 MCG/ACT nasal spray Place into both nostrils as needed for allergies or rhinitis.    [provider]  Glucosamine-Chondroit-Vit C-Mn (GLUCOSAMINE CHONDROITIN COMPLX) CAPS Take 2 capsules by mouth daily. 1800 mg    [provider]  hydrocortisone (PROCTOSOL HC) 2.5 % rectal cream Place 1 application rectally as needed for hemorrhoids or itching.    [provider]  hyoscyamine (LEVSIN) 0.125 MG tablet Take 1 tablet (0.125 mg total) by mouth every 4 (four) hours as needed. 07/14/19   [name], MD  Multiple Vitamins-Minerals (OCUVITE  PRESERVISION PO) Take by mouth daily.    [provider]  Omega-3 Fatty Acids (FISH OIL) 1200 MG CAPS Take 2 capsules by mouth daily.    [provider]  propranolol ER (INDERAL LA) 80 MG 24 hr capsule Take 1 capsule (80 mg total) by mouth daily. Patient not taking: Reported on 02/02/2019 08/04/18   Pickard,  [name], MD  sildenafil (VIAGRA) 100 MG tablet Take 0.5-1 tablets (50-100 mg total) by mouth daily as needed for erectile dysfunction. 07/14/19   [name], MD  zolpidem (AMBIEN) 10 MG tablet Take 0.5-1 tablets (5-10 mg total) by mouth at bedtime as needed. for sleep 11/04/19   [name], MD     Allergies:     Allergies  Allergen Reactions  . Morphine     Other reaction(s): VOMITING     Physical Exam:   Vitals  Blood pressure (!) 148/76, pulse 93, temperature 99.2 F (37.3 C), temperature source Oral, resp. rate (!) 32, SpO2 91 %.  1.  General: Lying supine in bed with head of bed elevated  2. Psychiatric: Mood and behavioral normal for situation  3. Neurologic: No focal deficits on limited neuro exam At baseline Cranial nerves II through XII grossly intact, moves all 4 extremities voluntarily  4. HEENMT:  Head is atraumatic, normocephalic Pupils are reactive Conjunctive are noninjected Neck is supple Trachea midline Mucous membranes are dry  5. Respiratory : Lungs are rhonchorous bilaterally Wheezing present bilaterally No crackles 15 L high flow nasal cannula and nonrebreather in place No cyanosis peripherally Accessory muscle use Speaks in 3-4 word phrases before taking a breath  6. Cardiovascular : Heart rate is tachycardic, rhythm is regular No murmurs auscultated  7. Gastrointestinal:  Abdomen is soft, nondistended, nontender to palpation  8. Skin:  No acute lesions on limited skin exam  9.Musculoskeletal:  No peripheral edema or acute deformity    Data Review:    CBC Recent Labs  Lab 02/20/20 2006  WBC  6.5  HGB 15.0  HCT 44.2  PLT 200  MCV 91.9  MCH 31.2  MCHC 33.9  RDW 12.7  LYMPHSABS 0.5*  MONOABS 0.4  EOSABS 0.0  BASOSABS 0.0   ------------------------------------------------------------------------------------------------------------------  Results for orders placed or performed during the hospital encounter of 02/20/20 (from the past 48 hour(s))  Lactic acid, plasma     Status: None   Collection Time: 02/20/20  8:06 PM  Result Value Ref Range   Lactic Acid, Venous 1.9 0.5 - 1.9 mmol/L    Comment: Performed at [facility], [place] [address] [place], [place] [address]  CBC WITH DIFFERENTIAL     Status: Abnormal   Collection Time: 02/20/20  8:06 PM  Result Value Ref Range   WBC 6.5 4.0 - 10.5 K/uL   RBC 4.81 4.22 - 5.81 MIL/uL   Hemoglobin 15.0 13.0 - 17.0 g/dL   HCT 44.2 39 - 52 %   MCV 91.9 80.0 - 100.0 fL   MCH 31.2 26.0 - 34.0 pg   MCHC 33.9 30.0 - 36.0 g/dL   RDW 12.7 11.5 - 15.5 %   Platelets 200 150 - 400 K/uL   nRBC 0.0 0.0 - 0.2 %   Neutrophils Relative % 85 %   Neutro Abs 5.6 1.7 - 7.7 K/uL   Lymphocytes Relative 7 %   Lymphs Abs 0.5 (L) 0.7 - 4.0 K/uL   Monocytes Relative 6 %   Monocytes Absolute 0.4 0 - 1 K/uL   Eosinophils Relative 0 %   Eosinophils Absolute 0.0 0 - 0 K/uL   Basophils Relative 0 %   Basophils Absolute 0.0 0 - 0 K/uL   Immature Granulocytes 2 %   Abs Immature Granulocytes 0.12 (H) 0.00 - 0.07 K/uL    Comment: Performed at [facility] [address], [place], [place] [address]  Comprehensive metabolic panel     Status: Abnormal   Collection Time: 02/20/20  8:06 PM  Result Value Ref Range   Sodium 131 (L) 135 - 145 mmol/L   Potassium 3.6 3.5 - 5.1 mmol/L   Chloride 90 (L) 98 - 111 mmol/L   CO2 29 22 - 32 mmol/L   Glucose, Bld 134 (H) 70 - 99 mg/dL    Comment: Glucose reference range applies only to samples taken after fasting for at least 8 hours.   BUN 12 8 - 23 mg/dL   Creatinine, Ser 0.89 0.61 - 1.24 mg/dL    Calcium 8.0 (L) 8.9 - 10.3 mg/dL   Total Protein 5.8 (L) 6.5 - 8.1 g/dL   Albumin 2.5 (L) 3.5 - 5.0 g/dL   AST 84 (H) 15 - 41 U/L   ALT 52 (H) 0 - 44 U/L   Alkaline Phosphatase 118 38 - 126 U/L   Total Bilirubin 1.0 0.3 - 1.2 mg/dL   GFR calc non Af Amer >60 >60 mL/min   GFR calc Af Amer >60 >60 mL/min   Anion gap 12 5 - 15    Comment: Performed at [facility] [address] [place], [place] [address]    Chemistries  Recent Labs  Lab 02/20/20 2006  NA 131*  K 3.6  CL 90*  CO2 29  GLUCOSE 134*  BUN 12  CREATININE 0.89  CALCIUM 8.0*  AST 84*  ALT 52*  ALKPHOS 118  BILITOT 1.0   ------------------------------------------------------------------------------------------------------------------  ------------------------------------------------------------------------------------------------------------------ GFR: Estimated Creatinine Clearance: 102.6 mL/min (by C-G formula based on SCr of 0.89 mg/dL). Liver Function Tests: Recent Labs  Lab 02/20/20 2006  AST 84*  ALT 52*  ALKPHOS 118  BILITOT 1.0  PROT 5.8*  ALBUMIN 2.5*   No results for input(s): LIPASE, AMYLASE in the last 168 hours. No results for input(s): AMMONIA in the last 168 hours. Coagulation Profile: No results for input(s): INR, PROTIME in the last 168 hours. Cardiac Enzymes: No results for input(s): CKTOTAL, CKMB, CKMBINDEX, TROPONINI in the last 168 hours. BNP (last 3 results) No results for input(s): PROBNP in the last 8760 hours. HbA1C: No results for input(s): HGBA1C in the last 72 hours. CBG: No results for input(s): GLUCAP in the last 168 hours. Lipid Profile: No results for input(s): CHOL, HDL, LDLCALC, TRIG, CHOLHDL, LDLDIRECT in the last 72 hours. Thyroid Function Tests: No results for input(s): TSH, T4TOTAL, FREET4, T3FREE, THYROIDAB in the last 72 hours. Anemia Panel: No results for input(s): VITAMINB12, FOLATE, FERRITIN, TIBC, IRON, RETICCTPCT in the last 72  hours.  --------------------------------------------------------------------------------------------------------------- Urine analysis: No results found for: COLORURINE, APPEARANCEUR, LABSPEC, PHURINE, GLUCOSEU, HGBUR, BILIRUBINUR, KETONESUR, PROTEINUR, UROBILINOGEN, NITRITE, LEUKOCYTESUR    Imaging Results:    DG Chest Port 1 View  Result Date: 02/20/2020 CLINICAL DATA:  Shortness of breath.  COVID positive 1 week ago. EXAM: PORTABLE CHEST 1 VIEW COMPARISON:  None. FINDINGS: Cardiomegaly with normal mediastinal contours. Moderate to advanced heterogeneous bilateral lung opacities in a mid lower lung zone predominant distribution. No evidence of pneumothorax and pneumomediastinum. No large pleural effusion. No acute osseous abnormalities are seen. IMPRESSION: Moderate to advanced bilateral heterogeneous lung opacities consistent with COVID pneumonia. Electronically Signed   By: Keith Rake M.D.   On: 02/20/2020 21:13    My personal review of EKG: Rhythm ST, Rate 92 /min, QTc 440, no Acute ST changes   Assessment & Plan:    Active Problems:   Pneumonia due to COVID-19 virus  1. Acute hypoxic Respiratory failure 1. EMS reports O2 sat down to 48% 2. Requiring 15L HFNC in ED 3. 2/2 COVID - tested positive 7/26 4. Decadron and Remdesivir started - continue 5. Albuterol and Combivent inhalers PRN and scheduled  6. COVID precautions 7. Unvaccinated 8. Wean off O2 as tolerated 9. Trend trop, EKG without significant changes 10. Inflammatory markers pending 11. Monitor on progressive unit 2. Covid Pneumonia 1. Chest x-ray shows moderate to advanced heterogeneous bilateral lung opacities in mid lower lung zone predominant distribution consistent with COVID-19 2. Tested positive for COVID-19 on July 26 3. Inflammatory markers pending 4. Continue Covid precautions, steroids, inhalers, Remdesivir 3. Elevated transaminases 1. AST 84 -baseline 16 2. ALT 52 -baseline 22 3. Secondary to  above 4. Trend in a.m. on CMP 4. HTN 1. Continue home medication 5. Mild hyponatremia 1. Na 131, corrects to 132 2. Continue gentle fluids 3. Trend in the AM   DVT Prophylaxis-  Heparin - SCDs   AM Labs Ordered, also please review Full Orders  Family Communication: No family at bedside Code Status:  Full  Admission status: Observation/Inpatient :The appropriate admission status for this patient is INPATIENT. Inpatient status is judged to be reasonable and necessary in order to provide the required intensity of service to ensure the patient's safety. The patient's presenting symptoms, physical exam findings, and initial radiographic and laboratory data in the context of their chronic comorbidities is felt to place them at high risk for further clinical deterioration. Furthermore, it is not anticipated that the patient will be medically stable for discharge from the hospital within 2 midnights of admission. The following factors support the admission status of inpatient.     The patient's presenting symptoms include Acute, severe, dyspnea The worrisome physical exam findings include Hypoxia down to 48% The initial radiographic and laboratory data are worrisome because of Opacities consistent with COVID 19 The chronic co-morbidities include HTN        I certify that at the point of admission it is my clinical judgment that the patient will require inpatient hospital care spanning beyond 2 midnights from the point of admission due to high intensity of service, high risk for further deterioration and high frequency of surveillance required. Time spent in minutes : [place]

## 2020-02-20 NOTE — ED Notes (Signed)
Please call wife for an update at [phone]

## 2020-02-21 ENCOUNTER — Telehealth: Payer: Self-pay | Admitting: Family Medicine

## 2020-02-21 ENCOUNTER — Other Ambulatory Visit: Payer: Self-pay

## 2020-02-21 DIAGNOSIS — I10 Essential (primary) hypertension: Secondary | ICD-10-CM

## 2020-02-21 DIAGNOSIS — K5790 Diverticulosis of intestine, part unspecified, without perforation or abscess without bleeding: Secondary | ICD-10-CM

## 2020-02-21 DIAGNOSIS — K589 Irritable bowel syndrome without diarrhea: Secondary | ICD-10-CM | POA: Diagnosis present

## 2020-02-21 DIAGNOSIS — J9601 Acute respiratory failure with hypoxia: Secondary | ICD-10-CM | POA: Diagnosis present

## 2020-02-21 LAB — CBC WITH DIFFERENTIAL/PLATELET
Abs Immature Granulocytes: 0.09 10*3/uL — ABNORMAL HIGH (ref 0.00–0.07)
Basophils Absolute: 0 10*3/uL (ref 0.0–0.1)
Basophils Relative: 0 %
Eosinophils Absolute: 0 10*3/uL (ref 0.0–0.5)
Eosinophils Relative: 0 %
HCT: 45.2 % (ref 39.0–52.0)
Hemoglobin: 15.3 g/dL (ref 13.0–17.0)
Immature Granulocytes: 2 %
Lymphocytes Relative: 6 %
Lymphs Abs: 0.3 10*3/uL — ABNORMAL LOW (ref 0.7–4.0)
MCH: 31 pg (ref 26.0–34.0)
MCHC: 33.8 g/dL (ref 30.0–36.0)
MCV: 91.5 fL (ref 80.0–100.0)
Monocytes Absolute: 0.2 10*3/uL (ref 0.1–1.0)
Monocytes Relative: 4 %
Neutro Abs: 4.8 10*3/uL (ref 1.7–7.7)
Neutrophils Relative %: 88 %
Platelets: 192 10*3/uL (ref 150–400)
RBC: 4.94 MIL/uL (ref 4.22–5.81)
RDW: 12.9 % (ref 11.5–15.5)
WBC: 5.4 10*3/uL (ref 4.0–10.5)
nRBC: 0 % (ref 0.0–0.2)

## 2020-02-21 LAB — COMPREHENSIVE METABOLIC PANEL
ALT: 53 U/L — ABNORMAL HIGH (ref 0–44)
AST: 83 U/L — ABNORMAL HIGH (ref 15–41)
Albumin: 2.3 g/dL — ABNORMAL LOW (ref 3.5–5.0)
Alkaline Phosphatase: 124 U/L (ref 38–126)
Anion gap: 11 (ref 5–15)
BUN: 12 mg/dL (ref 8–23)
CO2: 30 mmol/L (ref 22–32)
Calcium: 7.8 mg/dL — ABNORMAL LOW (ref 8.9–10.3)
Chloride: 92 mmol/L — ABNORMAL LOW (ref 98–111)
Creatinine, Ser: 0.79 mg/dL (ref 0.61–1.24)
GFR calc Af Amer: >60 mL/min (ref >60)
GFR calc non Af Amer: >60 mL/min (ref >60)
Glucose, Bld: 139 mg/dL — ABNORMAL HIGH (ref 70–99)
Potassium: 4 mmol/L (ref 3.5–5.1)
Sodium: 133 mmol/L — ABNORMAL LOW (ref 135–145)
Total Bilirubin: 0.7 mg/dL (ref 0.3–1.2)
Total Protein: 5.7 g/dL — ABNORMAL LOW (ref 6.5–8.1)

## 2020-02-21 LAB — I-STAT ARTERIAL BLOOD GAS, ED
Acid-Base Excess: 7 mmol/L — ABNORMAL HIGH (ref 0.0–2.0)
Bicarbonate: 31.4 mmol/L — ABNORMAL HIGH (ref 20.0–28.0)
Calcium, Ion: 1.07 mmol/L — ABNORMAL LOW (ref 1.15–1.40)
HCT: 47 % (ref 39.0–52.0)
Hemoglobin: 16 g/dL (ref 13.0–17.0)
O2 Saturation: 84 %
Patient temperature: 99.2
Potassium: 3.7 mmol/L (ref 3.5–5.1)
Sodium: 133 mmol/L — ABNORMAL LOW (ref 135–145)
TCO2: 33 mmol/L — ABNORMAL HIGH (ref 22–32)
pCO2 arterial: 42.3 mmHg (ref 32.0–48.0)
pH, Arterial: 7.48 — ABNORMAL HIGH (ref 7.350–7.450)
pO2, Arterial: 46 mmHg — ABNORMAL LOW (ref 83.0–108.0)

## 2020-02-21 LAB — TROPONIN I (HIGH SENSITIVITY)
Troponin I (High Sensitivity): 134 ng/L (ref <18)
Troponin I (High Sensitivity): 171 ng/L (ref <18)

## 2020-02-21 LAB — MAGNESIUM: Magnesium: 2.5 mg/dL — ABNORMAL HIGH (ref 1.7–2.4)

## 2020-02-21 LAB — D-DIMER, QUANTITATIVE: D-Dimer, Quant: 10.97 ug/mL-FEU — ABNORMAL HIGH (ref 0.00–0.50)

## 2020-02-21 LAB — PROCALCITONIN: Procalcitonin: 0.37 ng/mL

## 2020-02-21 LAB — FERRITIN: Ferritin: 1337 ng/mL — ABNORMAL HIGH (ref 24–336)

## 2020-02-21 LAB — PHOSPHORUS: Phosphorus: 4.3 mg/dL (ref 2.5–4.6)

## 2020-02-21 LAB — C-REACTIVE PROTEIN: CRP: 16.7 mg/dL — ABNORMAL HIGH (ref <1.0)

## 2020-02-21 MED ORDER — HYDROCOD POLST-CPM POLST ER 10-8 MG/5ML PO SUER
5.0000 mL | Freq: Two times a day (BID) | ORAL | Status: DC
  Administered 2020-02-21 – 2020-03-03 (×23): 5 mL via ORAL
  Filled 2020-02-21 (×24): qty 5

## 2020-02-21 MED ORDER — TOCILIZUMAB 400 MG/20ML IV SOLN
800.0000 mg | Freq: Once | INTRAVENOUS | Status: AC
  Administered 2020-02-21: 800 mg via INTRAVENOUS
  Filled 2020-02-21: qty 40

## 2020-02-21 MED ORDER — ENOXAPARIN SODIUM 60 MG/0.6ML ~~LOC~~ SOLN
60.0000 mg | Freq: Two times a day (BID) | SUBCUTANEOUS | Status: DC
  Administered 2020-02-21 – 2020-02-22 (×2): 60 mg via SUBCUTANEOUS
  Filled 2020-02-21 (×2): qty 0.6

## 2020-02-21 MED ORDER — DEXAMETHASONE 6 MG PO TABS
6.0000 mg | ORAL_TABLET | Freq: Two times a day (BID) | ORAL | Status: DC
  Administered 2020-02-21 – 2020-02-22 (×3): 6 mg via ORAL
  Filled 2020-02-21: qty 2
  Filled 2020-02-21 (×2): qty 1

## 2020-02-21 NOTE — Progress Notes (Signed)
I spoke with Ryan Turner wife over the phone, we talked about his diagnosis of severe hypoxemic respiratory failure due to COVID 19 viral pneumonia, plan of treatment and current prognosis.   She expressed understanding of his critical condition and high risk of further worsening respiratory failure.

## 2020-02-21 NOTE — ED Notes (Signed)
Patient called out from room requesting someone put his oxygen mask back on. This RN found patient in room without high flow nasal cannula or non rebreather mask applied with an O2 saturation of 39% on room air, pt alert and attempting to talk to RN. Nasal cannula and mask immediately reapplied to patient, sats increased to 97% within 5 minutes. Patient reports that he removed oxygen to eat breakfast. This RN instructed pt to not remove nasal cannula without RN, MD, or RT present and to use call light immediately if nasal cannula or mask becomes malaligned or detached. Patient verbalized understanding. MD notified. Pt vital signs stable, alert and oriented. RN and nursing staff will continue to monitor oxygen saturation and equipment.

## 2020-02-21 NOTE — Progress Notes (Signed)
PROGRESS NOTE    Ryan Turner  AJO:[id number] DOB: [birth year] DOA: 02/20/2020 PCP: [name], MD    Brief Narrative:  Patient was admitted to the hospital with a working diagnosis of acute hypoxic respiratory failure due to SARS COVID-19 viral pneumonia.  69 year old male with past medical history for hypertension, diverticulosis, and irritable bowel syndrome who presents with worsening dyspnea.  Patient tested for COVID-19 July 26, his symptoms were consistent with fever, chills generalized malaise.  Over the following 2 days after diagnosis he had mild improvement of his symptoms, but for the last 5 days his symptoms have recurred and now more severe with myalgias, productive cough, fatigue, rhinorrhea, and sinus congestion.  On the day of admission reported severe dyspnea while at rest, EMS was called in his oxygen saturation was 48% on room air. He was placed on 15 L per high flow nasal cannula/nonrebreather with oxygen saturation up to 90%, temperature 99.2, blood pressure 143/87, heart rate 80, respirate 26, his lungs had rhonchi bilaterally, positive wheezing no rales, heart S1-S2, present rhythmic, abdomen soft, no lower extremity edema. Sodium 131, potassium 3.6, chloride 90, bicarb 29, glucose 134, BUN 12, creatinine 0.89, AST 84, ALT 52, white count 6.5, hemoglobin 15.0, hematocrit 44.2, platelets 200 Chest radiograph with bilateral interstitial/alveolar infiltrates, all 4 quadrants, predominantly at bases, no effusions. EKG 92 bpm, left axis deviation, sinus rhythm, no ST segment or T wave changes.   Assessment & Plan:   Principal Problem:   Pneumonia due to COVID-19 virus Active Problems:   Hypertension   Diverticulosis   Irritable bowel syndrome   Acute respiratory failure with hypoxia ([place])   1.  Acute hypoxic respiratory failure due to SARS COVID 19 viral pneumonia.   RR: 28  Pulse oxymetry: 95%  Fi02: 40 L/min 100% fi02 HFNC/ non re-breather mask.    COVID-19 Labs  Recent Labs    02/20/20 2230 02/21/20 0415  DDIMER 5.37* 10.97*  FERRITIN 1,337*  --   LDH 559*  --   CRP 16.7*  --     Lab Results  Component Value Date   SARSCOV2NAA DETECTED (A) 02/14/2020    Patient with high oxygen requirements and with high inflammatory markers, all four quadrants pulmonary involvement.  Stable AST 83 and ALT 53. Troponin elevation due to severe COVID pneumonia, no clinical signs pf acute coronary syndrome.    Continue with supplemental 02 per  target 02 saturation more than 88%, continue to monitor for signs of increase work of breathing.   Will increase systemic steroids to dexamethasone 6 mg IV q12, continue IV remdesivir and will add Tocilizumab.  Continue antitussive agents, bronchodilators and airway clearing techniques with incentive spirometer and flutter valve.   D dimer 10.97, will increase enoxaparin to BID dosing, high risk for thrombosis.   2. HTN. Continue b blockade for now to prevent rebound tachycardia. Close follow up on blood pressure.   3. Hyponatremia. Na is 133, will stop IV fluids, follow a restrictive IV fluids strategy. Follow up on renal function and electrolytes in am.   Patient continue to be at high risk for worsening hypoxic respiratory failure. Patient is critically ill with severe hypoxemia and imminent risk for deterioration  Critical care time 60 minutes.   Status is: Inpatient  Remains inpatient appropriate because:Inpatient level of care appropriate due to severity of illness   Dispo: The patient is from: Home              Anticipated d/c is to:  Home              Anticipated d/c date is: > 3 days              Patient currently is not medically stable to d/c.   DVT prophylaxis: Enoxaparin   Code Status:   full  Family Communication:  I left a message to his wife phone number, will try to call her later today.    Subjective: Patient continue to have severe dyspnea and fatigue, no nausea  or vomiting, no chest pain.   Objective: Vitals:   02/21/20 0359 02/21/20 0532 02/21/20 0630 02/21/20 0753  BP: (!) 150/83 (!) 153/81 (!) 143/79 (!) 142/81  Pulse: 69 74 67 69  Resp: (!) 29 (!) 24 (!) 28 (!) 28  Temp:      TempSrc:      SpO2: (!) 87% 92% 95% 100%    Intake/Output Summary (Last 24 hours) at 02/21/2020 0904 Last data filed at 02/21/2020 0258 Gross per 24 hour  Intake 500 ml  Output 500 ml  Net 0 ml   There were no vitals filed for this visit.  Examination:   General: deconditioned and ill looking appearing  Neurology: Awake and alert, non focal  E ENT: mild pallor, no icterus, oral mucosa moist Cardiovascular: No JVD. S1-S2 present, rhythmic, no gallops, rubs, or murmurs. No lower extremity edema. Pulmonary: positive breath sounds bilaterally, no wheezing or rhonchi.  Gastrointestinal. Abdomen soft and non tender Skin. No rashes Musculoskeletal: no joint deformities     Data Reviewed: I have personally reviewed following labs and imaging studies  CBC: Recent Labs  Lab 02/20/20 2006 02/20/20 2230 02/21/20 0415 02/21/20 0532  WBC 6.5 6.8 5.4  --   NEUTROABS 5.6  --  4.8  --   HGB 15.0 15.2 15.3 16.0  HCT 44.2 44.2 45.2 47.0  MCV 91.9 90.8 91.5  --   PLT 200 193 192  --    Basic Metabolic Panel: Recent Labs  Lab 02/20/20 2006 02/20/20 2230 02/21/20 0415 02/21/20 0532  NA 131*  --  133* 133*  K 3.6  --  4.0 3.7  CL 90*  --  92*  --   CO2 29  --  30  --   GLUCOSE 134*  --  139*  --   BUN 12  --  12  --   CREATININE 0.89 0.87 0.79  --   CALCIUM 8.0*  --  7.8*  --   MG  --   --  2.5*  --   PHOS  --   --  4.3  --    GFR: Estimated Creatinine Clearance: 114.2 mL/min (by C-G formula based on SCr of 0.79 mg/dL). Liver Function Tests: Recent Labs  Lab 02/20/20 2006 02/21/20 0415  AST 84* 83*  ALT 52* 53*  ALKPHOS 118 124  BILITOT 1.0 0.7  PROT 5.8* 5.7*  ALBUMIN 2.5* 2.3*   No results for input(s): LIPASE, AMYLASE in the last 168  hours. No results for input(s): AMMONIA in the last 168 hours. Coagulation Profile: No results for input(s): INR, PROTIME in the last 168 hours. Cardiac Enzymes: No results for input(s): CKTOTAL, CKMB, CKMBINDEX, TROPONINI in the last 168 hours. BNP (last 3 results) No results for input(s): PROBNP in the last 8760 hours. HbA1C: No results for input(s): HGBA1C in the last 72 hours. CBG: No results for input(s): GLUCAP in the last 168 hours. Lipid Profile: No results for input(s): CHOL, HDL, LDLCALC,  TRIG, CHOLHDL, LDLDIRECT in the last 72 hours. Thyroid Function Tests: No results for input(s): TSH, T4TOTAL, FREET4, T3FREE, THYROIDAB in the last 72 hours. Anemia Panel: Recent Labs    02/20/20 2230  FERRITIN 1,337*      Radiology Studies: I have reviewed all of the imaging during this hospital visit personally     Scheduled Meds: . dexamethasone  6 mg Oral Q24H  . docusate sodium  200 mg Oral Daily  . heparin  5,000 Units Subcutaneous Q8H  . Ipratropium-Albuterol  1 puff Inhalation Q6H  . multivitamin with minerals  1 tablet Oral Daily  . propranolol ER  80 mg Oral Daily   Continuous Infusions: . sodium chloride    . sodium chloride 75 mL/hr at 02/20/20 2301  . remdesivir 100 mg in NS 100 mL       LOS: 1 day        Tanesia Butner Gerome Apley, MD

## 2020-02-21 NOTE — ED Notes (Signed)
MD at bedside. 

## 2020-02-21 NOTE — ED Notes (Signed)
Lunch Tray Ordered @ 1151. 

## 2020-02-21 NOTE — ED Notes (Signed)
Unable to obtain orthostatic VS at this time. Patient is very weak on his feet. RN aware and in agreement.

## 2020-02-21 NOTE — ED Notes (Signed)
Pt is not stable enough to do orthostatic VS at this time. Pt is requiring more oxygen and is very dyspneic.

## 2020-02-21 NOTE — Telephone Encounter (Signed)
FYI: Patients wife left vm stating patient was taken to ER last night and that he was admitted.  CB# [phone]

## 2020-02-22 ENCOUNTER — Inpatient Hospital Stay (HOSPITAL_COMMUNITY): Payer: BC Managed Care – PPO

## 2020-02-22 DIAGNOSIS — R7989 Other specified abnormal findings of blood chemistry: Secondary | ICD-10-CM

## 2020-02-22 LAB — COMPREHENSIVE METABOLIC PANEL
ALT: 67 U/L — ABNORMAL HIGH (ref 0–44)
AST: 80 U/L — ABNORMAL HIGH (ref 15–41)
Albumin: 2.3 g/dL — ABNORMAL LOW (ref 3.5–5.0)
Alkaline Phosphatase: 126 U/L (ref 38–126)
Anion gap: 11 (ref 5–15)
BUN: 20 mg/dL (ref 8–23)
CO2: 28 mmol/L (ref 22–32)
Calcium: 8 mg/dL — ABNORMAL LOW (ref 8.9–10.3)
Chloride: 95 mmol/L — ABNORMAL LOW (ref 98–111)
Creatinine, Ser: 0.79 mg/dL (ref 0.61–1.24)
GFR calc Af Amer: >60 mL/min (ref >60)
GFR calc non Af Amer: >60 mL/min (ref >60)
Glucose, Bld: 142 mg/dL — ABNORMAL HIGH (ref 70–99)
Potassium: 4 mmol/L (ref 3.5–5.1)
Sodium: 134 mmol/L — ABNORMAL LOW (ref 135–145)
Total Bilirubin: 0.8 mg/dL (ref 0.3–1.2)
Total Protein: 5.7 g/dL — ABNORMAL LOW (ref 6.5–8.1)

## 2020-02-22 LAB — D-DIMER, QUANTITATIVE: D-Dimer, Quant: >20 ug/mL-FEU — ABNORMAL HIGH (ref 0.00–0.50)

## 2020-02-22 LAB — C-REACTIVE PROTEIN: CRP: 10.9 mg/dL — ABNORMAL HIGH (ref <1.0)

## 2020-02-22 LAB — FERRITIN: Ferritin: 1349 ng/mL — ABNORMAL HIGH (ref 24–336)

## 2020-02-22 MED ORDER — METHYLPREDNISOLONE SODIUM SUCC 40 MG IJ SOLR
40.0000 mg | Freq: Two times a day (BID) | INTRAMUSCULAR | Status: DC
  Administered 2020-02-22 – 2020-02-29 (×14): 40 mg via INTRAVENOUS
  Filled 2020-02-22 (×14): qty 1

## 2020-02-22 MED ORDER — ENOXAPARIN SODIUM 120 MG/0.8ML ~~LOC~~ SOLN
115.0000 mg | Freq: Two times a day (BID) | SUBCUTANEOUS | Status: AC
  Administered 2020-02-22 – 2020-02-29 (×15): 115 mg via SUBCUTANEOUS
  Filled 2020-02-22 (×15): qty 0.76

## 2020-02-22 MED ORDER — ENOXAPARIN SODIUM 60 MG/0.6ML ~~LOC~~ SOLN
55.0000 mg | Freq: Once | SUBCUTANEOUS | Status: AC
  Administered 2020-02-22: 55 mg via SUBCUTANEOUS
  Filled 2020-02-22: qty 0.6

## 2020-02-22 NOTE — Progress Notes (Addendum)
PROGRESS NOTE    Zacharee Gaddie  WUJ:[id number] DOB: Jan 17, 1951 DOA: 02/20/2020 PCP: [name], MD    Brief Narrative:  Patient was admitted to the hospital with a working diagnosis of acute hypoxic respiratory failure due to SARS COVID-19 viral pneumonia.  69 year old male with past medical history for hypertension, diverticulosis, and irritable bowel syndrome who presents with worsening dyspnea.  Patient tested for COVID-19 July 26, his symptoms were consistent with fever, chills generalized malaise.  Over the following 2 days after diagnosis he had mild improvement of his symptoms, but for the last 5 days his symptoms have recurred and now more severe with myalgias, productive cough, fatigue, rhinorrhea, and sinus congestion.  On the day of admission reported severe dyspnea while at rest, EMS was called in his oxygen saturation was 48% on room air. He was placed on 15 L per high flow nasal cannula/nonrebreather with oxygen saturation up to 90%, temperature 99.2, blood pressure 143/87, heart rate 80, respirate 26, his lungs had rhonchi bilaterally, positive wheezing no rales, heart S1-S2, present rhythmic, abdomen soft, no lower extremity edema. Sodium 131, potassium 3.6, chloride 90, bicarb 29, glucose 134, BUN 12, creatinine 0.89, AST 84, ALT 52, white count 6.5, hemoglobin 15.0, hematocrit 44.2, platelets 200 Chest radiograph with bilateral interstitial/alveolar infiltrates, all 4 quadrants, predominantly at bases, no effusions. EKG 92 bpm, left axis deviation, sinus rhythm, no ST segment or T wave changes.  Patient has been placed on [place] and non rebreather mask, he continue to have very high oxygen requirements.  Placed on high dose systemic steroids and antiviral therapy. Received Tocilizumab.   Positive left lower extremity DVT.   Assessment & Plan:   Principal Problem:   Pneumonia due to COVID-19 virus Active Problems:   Hypertension   Diverticulosis   Irritable bowel  syndrome   Acute respiratory failure with hypoxia ([place])   1.  Acute hypoxic respiratory failure due to SARS COVID 19 viral pneumonia. Sp tocilizumab 08/02  RR: 22  Pulse oxymetry: 89  Fi02: 45 L/min per HFNC/ Non- re-breather mask/ 100% Fio2  COVID-19 Labs  Recent Labs    02/20/20 2230 02/21/20 0415 02/22/20 0500  DDIMER 5.37* 10.97* >20.00*  FERRITIN 1,337*  --  1,349*  LDH 559*  --   --   CRP 16.7*  --  10.9*    Lab Results  Component Value Date   SARSCOV2NAA DETECTED (A) 02/14/2020    Patient continue with high oxygen requirements, he reports his dyspnea to be stable. Poor appetite and positive weakness.  Inflammatory markers continue to be elevated, with d dimer more than 20, [place] lower extremities positive for DVT on the left posterior tibial and peroneal veins.   Will change dexamethasone to methylprednisolone 40 mg IV q12, continue antiviral therapy with remdesivir.  Keep oxygen saturation more than 86 to 88%, monitor work of breathing and mentation.    On antitussive agents, bronchodilators and airway clearing techniques with incentive spirometer and flutter valve.  Continue therapeutic dose of enoxaparin  2. New DVT left posterior tibial and peroneal veins. Will continue full anticoagulation with enoxaparin.       2. HTN. Blood pressure has remain stable, this am is 128/73. Will continue to hold on IV fluids, continue blood pressure monitoring.  Continue with propranolol to prevent rebound tachycardia.   3. Hyponatremia. Serum Na this am is 134, with serum cr at 0,79 and K at 4,0/ bicarbonate at 28. Patient with poor oral intake. Will follow on renal panel  in am, avoid hypotension and nephrotoxic agents.   Patient continue to be at high risk for worsening hypoxic respiratory failure. Patient is critically ill with severe hypoxemia and imminent risk for deterioration  Patient continue to be at high risk for worsening hypoxemic respiratory failure  Status is:  Inpatient  Remains inpatient appropriate because:IV treatments appropriate due to intensity of illness or inability to take PO   Dispo: The patient is from: Home              Anticipated d/c is to: Home              Anticipated d/c date is: > 3 days              Patient currently is not medically stable to d/c.   DVT prophylaxis: Enoxaparin   Code Status:   full  Family Communication:  I spoke over the phone with the patient's wife ans son about patient's  condition, plan of care, prognosis and all questions were addressed.      Subjective: Patient continue to be very weak and deconditioned, continue to have dyspnea at rest and worse with exertion. Mild improvement from yesterday but continue to be severe. No nausea or vomiting, but poor appetite and poor oral intake.   Objective: Vitals:   02/22/20 0443 02/22/20 0725 02/22/20 0850 02/22/20 1004  BP:  136/76    Pulse: (!) 56 (!) 56  63  Resp: 20 (!) 22    Temp:  98.1 F (36.7 C)    TempSrc:  Axillary    SpO2: 91% (!) 89% (!) 89%   Weight:      Height:        Intake/Output Summary (Last 24 hours) at 02/22/2020 1110 Last data filed at 02/22/2020 6606 Gross per 24 hour  Intake --  Output 600 ml  Net -600 ml   Filed Weights   02/21/20 1700  Weight: 115.7 kg    Examination:   General: deconditioned and ill looking appearing  Neurology: Awake and alert, non focal  E ENT: mild pallor, no icterus, oral mucosa moist Cardiovascular: No JVD. S1-S2 present, rhythmic, no gallops, rubs, or murmurs. No lower extremity edema. Pulmonary: positive breath sounds bilaterally, no wheezing Gastrointestinal. Abdomen soft and non tender Skin. No rashes Musculoskeletal: no joint deformities     Data Reviewed: I have personally reviewed following labs and imaging studies  CBC: Recent Labs  Lab 02/20/20 2006 02/20/20 2230 02/21/20 0415 02/21/20 0532  WBC 6.5 6.8 5.4  --   NEUTROABS 5.6  --  4.8  --   HGB 15.0 15.2 15.3 16.0   HCT 44.2 44.2 45.2 47.0  MCV 91.9 90.8 91.5  --   PLT 200 193 192  --    Basic Metabolic Panel: Recent Labs  Lab 02/20/20 2006 02/20/20 2230 02/21/20 0415 02/21/20 0532 02/22/20 0500  NA 131*  --  133* 133* 134*  K 3.6  --  4.0 3.7 4.0  CL 90*  --  92*  --  95*  CO2 29  --  30  --  28  GLUCOSE 134*  --  139*  --  142*  BUN 12  --  12  --  20  CREATININE 0.89 0.87 0.79  --  0.79  CALCIUM 8.0*  --  7.8*  --  8.0*  MG  --   --  2.5*  --   --   PHOS  --   --  4.3  --   --  GFR: Estimated Creatinine Clearance: 112.8 mL/min (by C-G formula based on SCr of 0.79 mg/dL). Liver Function Tests: Recent Labs  Lab 02/20/20 2006 02/21/20 0415 02/22/20 0500  AST 84* 83* 80*  ALT 52* 53* 67*  ALKPHOS 118 124 126  BILITOT 1.0 0.7 0.8  PROT 5.8* 5.7* 5.7*  ALBUMIN 2.5* 2.3* 2.3*   No results for input(s): LIPASE, AMYLASE in the last 168 hours. No results for input(s): AMMONIA in the last 168 hours. Coagulation Profile: No results for input(s): INR, PROTIME in the last 168 hours. Cardiac Enzymes: No results for input(s): CKTOTAL, CKMB, CKMBINDEX, TROPONINI in the last 168 hours. BNP (last 3 results) No results for input(s): PROBNP in the last 8760 hours. HbA1C: No results for input(s): HGBA1C in the last 72 hours. CBG: No results for input(s): GLUCAP in the last 168 hours. Lipid Profile: No results for input(s): CHOL, HDL, LDLCALC, TRIG, CHOLHDL, LDLDIRECT in the last 72 hours. Thyroid Function Tests: No results for input(s): TSH, T4TOTAL, FREET4, T3FREE, THYROIDAB in the last 72 hours. Anemia Panel: Recent Labs    02/20/20 2230 02/22/20 0500  FERRITIN 1,337* 1,349*      Radiology Studies: I have reviewed all of the imaging during this hospital visit personally     Scheduled Meds: . chlorpheniramine-HYDROcodone  5 mL Oral Q12H  . dexamethasone  6 mg Oral Q12H  . docusate sodium  200 mg Oral Daily  . enoxaparin (LOVENOX) injection  60 mg Subcutaneous BID  .  Ipratropium-Albuterol  1 puff Inhalation Q6H  . multivitamin with minerals  1 tablet Oral Daily  . propranolol ER  80 mg Oral Daily   Continuous Infusions: . remdesivir 100 mg in NS 100 mL 100 mg (02/22/20 1001)     LOS: 2 days        Krupa Stege Gerome Apley, MD

## 2020-02-22 NOTE — Plan of Care (Signed)
  Problem: Respiratory: Goal: Will maintain a patent airway Outcome: Progressing Goal: Complications related to the disease process, condition or treatment will be avoided or minimized Outcome: Progressing   

## 2020-02-22 NOTE — Progress Notes (Signed)
Lower extremity venous bilateral study completed.   Preliminary results relayed to Janett Billow, RN.  See Cv Proc for preliminary results.   Darlin Coco

## 2020-02-23 LAB — COMPREHENSIVE METABOLIC PANEL
ALT: 65 U/L — ABNORMAL HIGH (ref 0–44)
AST: 61 U/L — ABNORMAL HIGH (ref 15–41)
Albumin: 2.2 g/dL — ABNORMAL LOW (ref 3.5–5.0)
Alkaline Phosphatase: 136 U/L — ABNORMAL HIGH (ref 38–126)
Anion gap: 8 (ref 5–15)
BUN: 22 mg/dL (ref 8–23)
CO2: 32 mmol/L (ref 22–32)
Calcium: 8 mg/dL — ABNORMAL LOW (ref 8.9–10.3)
Chloride: 98 mmol/L (ref 98–111)
Creatinine, Ser: 0.9 mg/dL (ref 0.61–1.24)
GFR calc Af Amer: >60 mL/min (ref >60)
GFR calc non Af Amer: >60 mL/min (ref >60)
Glucose, Bld: 120 mg/dL — ABNORMAL HIGH (ref 70–99)
Potassium: 4.2 mmol/L (ref 3.5–5.1)
Sodium: 138 mmol/L (ref 135–145)
Total Bilirubin: 0.7 mg/dL (ref 0.3–1.2)
Total Protein: 5.3 g/dL — ABNORMAL LOW (ref 6.5–8.1)

## 2020-02-23 LAB — MRSA PCR SCREENING: MRSA by PCR: NEGATIVE

## 2020-02-23 LAB — FERRITIN: Ferritin: 1020 ng/mL — ABNORMAL HIGH (ref 24–336)

## 2020-02-23 LAB — D-DIMER, QUANTITATIVE: D-Dimer, Quant: >20 ug/mL-FEU — ABNORMAL HIGH (ref 0.00–0.50)

## 2020-02-23 LAB — C-REACTIVE PROTEIN: CRP: 5.1 mg/dL — ABNORMAL HIGH (ref <1.0)

## 2020-02-23 MED ORDER — ORAL CARE MOUTH RINSE
15.0000 mL | Freq: Two times a day (BID) | OROMUCOSAL | Status: DC
  Administered 2020-02-23 – 2020-03-03 (×19): 15 mL via OROMUCOSAL

## 2020-02-23 NOTE — Progress Notes (Signed)
PROGRESS NOTE    Ryan Turner  QIW:[id number] DOB: November 08, 1950 DOA: 02/20/2020 PCP: [name], MD    Brief Narrative:  Patient was admitted to the hospital with a working diagnosis of acute hypoxic respiratory failure due to SARS COVID-19 viral pneumonia.  69 year old male with past medical history for hypertension, diverticulosis, and irritable bowel syndrome who presents with worsening dyspnea. Patient tested for COVID-19 July 26, his symptoms were consistent with fever, chills generalized malaise. Over the following 2 days after diagnosis he had mild improvement of his symptoms, but for the last 5 days his symptoms have recurred and now more severe with myalgias, productive cough, fatigue, rhinorrhea, and sinus congestion. On the day of admission reported severe dyspnea while at rest, EMS was called in his oxygen saturation was 48% on room air. He was placed on 15 L per high flow nasal cannula/nonrebreather with oxygen saturation up to 90%, temperature 99.2, blood pressure 143/87, heart rate 80, respirate 26, his lungs had rhonchi bilaterally, positive wheezing no rales, heart S1-S2, present rhythmic, abdomen soft, no lower extremity edema. Sodium 131, potassium 3.6, chloride 90, bicarb 29, glucose 134, BUN 12, creatinine 0.89, AST 84, ALT 52,white count 6.5, hemoglobin 15.0, hematocrit 44.2, platelets 200 Chest radiograph with bilateral interstitial/alveolar infiltrates, all 4 quadrants, predominantly at bases, no effusions. EKG 92 bpm, left axis deviation, sinus rhythm, no ST segment or T wave changes.  Patient has been placed on [place] and non rebreather mask, he continue to have very high oxygen requirements.  Placed on high dose systemic steroids and antiviral therapy. Received Tocilizumab.   Positive left lower extremity DVT, and placed on full anticoagulation.    Assessment & Plan:   Principal Problem:   Pneumonia due to COVID-19 virus Active Problems:    Hypertension   Diverticulosis   Irritable bowel syndrome   Acute respiratory failure with hypoxia ([place])   1. Acute hypoxic respiratory failure due to SARS COVID 19 viral pneumonia. Sp tocilizumab 08/02.   RR: 19  Pulse oxymetry: 92%  Fi02: 35 L/min Fi02 100%  COVID-19 Labs  Recent Labs    02/20/20 2230 02/20/20 2230 02/21/20 0415 02/22/20 0500 02/23/20 0455  DDIMER 5.37*   < > 10.97* >20.00* >20.00*  FERRITIN 1,337*  --   --  1,349* 1,020*  LDH 559*  --   --   --   --   CRP 16.7*  --   --  10.9* 5.1*   < > = values in this interval not displayed.    Lab Results  Component Value Date   SARSCOV2NAA DETECTED (A) 02/14/2020    Inflammatory markers continue to be elevated, flow has decreased from 45 to 35 L with good toleration, continue with 100% Fi02.   Continue medical therapy with dexamethasone methylprednisolone 40 mg IV q12, and antiviral therapy with remdesivir  #4/5  Continue with antitussive agents, bronchodilators and airway clearing techniques with incentive spirometer and flutter valve.  Out of bed to chair tid with meals, follow with PT and OT recommendations.   Encourage prone position 3 to 4 hours at a time for a total of 16 hours a day as tolerated.   2. New DVT left posterior tibial and peroneal veins.  Continue full dose anticoagulation with enoxaparin  2. HTN. Blood pressure systolic 417 mmHg, will continue with propranolol to prevent rebound tachycardia.   3. Hyponatremia. Stable renal function with serum cr at 0.90 with K at 4,2 and serum bicarbonate at 32, Na 132.  Continue  to hold on IV fluids, follow a restrictive IV fluids strategy.    Patient continue to be at high risk for worsening respiratory failure.   Status is: Inpatient  Remains inpatient appropriate because:IV treatments appropriate due to intensity of illness or inability to take PO   Dispo: The patient is from: Home              Anticipated d/c is to: Home               Anticipated d/c date is: > 3 days              Patient currently is not medically stable to d/c.   DVT prophylaxis: Enoxaparin   Code Status:   full  Family Communication:  I spoke over the phone with the patient's son  about patient's  condition, plan of care, prognosis and all questions were addressed.         Subjective: Patient continue to have dyspnea, worse with minimal efforts, positive cough, no chest pain, this am is out of bed in the chair.   Objective: Vitals:   02/23/20 0400 02/23/20 0721 02/23/20 0803 02/23/20 0901  BP: 136/84  (!) 141/79   Pulse: (!) 56 (!) 53 (!) 55 (!) 53  Resp: 20 20 20 19   Temp: 98.3 F (36.8 C)  98.1 F (36.7 C)   TempSrc: Axillary  Oral   SpO2: 93% 92%    Weight:      Height:        Intake/Output Summary (Last 24 hours) at 02/23/2020 0943 Last data filed at 02/23/2020 0533 Gross per 24 hour  Intake 220 ml  Output 1150 ml  Net -930 ml   Filed Weights   02/21/20 1700  Weight: 115.7 kg    Examination:   General: deconditioned and ill looking appearing  Neurology: Awake and alert, non focal  E ENT: mild pallor, no icterus, oral mucosa moist Cardiovascular: No JVD. S1-S2 present, rhythmic, no gallops, rubs, or murmurs. Non pitting lower extremity edema. Pulmonary: positive breath sounds bilaterally, no wheesing Gastrointestinal. Abdomen soft and non tender Skin. No rashes Musculoskeletal: no joint deformities     Data Reviewed: I have personally reviewed following labs and imaging studies  CBC: Recent Labs  Lab 02/20/20 2006 02/20/20 2230 02/21/20 0415 02/21/20 0532  WBC 6.5 6.8 5.4  --   NEUTROABS 5.6  --  4.8  --   HGB 15.0 15.2 15.3 16.0  HCT 44.2 44.2 45.2 47.0  MCV 91.9 90.8 91.5  --   PLT 200 193 192  --    Basic Metabolic Panel: Recent Labs  Lab 02/20/20 2006 02/20/20 2230 02/21/20 0415 02/21/20 0532 02/22/20 0500 02/23/20 0455  NA 131*  --  133* 133* 134* 138  K 3.6  --  4.0 3.7 4.0 4.2  CL 90*  --   92*  --  95* 98  CO2 29  --  30  --  28 32  GLUCOSE 134*  --  139*  --  142* 120*  BUN 12  --  12  --  20 22  CREATININE 0.89 0.87 0.79  --  0.79 0.90  CALCIUM 8.0*  --  7.8*  --  8.0* 8.0*  MG  --   --  2.5*  --   --   --   PHOS  --   --  4.3  --   --   --    GFR: Estimated Creatinine Clearance: 100.3 mL/min (  by C-G formula based on SCr of 0.9 mg/dL). Liver Function Tests: Recent Labs  Lab 02/20/20 2006 02/21/20 0415 02/22/20 0500 02/23/20 0455  AST 84* 83* 80* 61*  ALT 52* 53* 67* 65*  ALKPHOS 118 124 126 136*  BILITOT 1.0 0.7 0.8 0.7  PROT 5.8* 5.7* 5.7* 5.3*  ALBUMIN 2.5* 2.3* 2.3* 2.2*   No results for input(s): LIPASE, AMYLASE in the last 168 hours. No results for input(s): AMMONIA in the last 168 hours. Coagulation Profile: No results for input(s): INR, PROTIME in the last 168 hours. Cardiac Enzymes: No results for input(s): CKTOTAL, CKMB, CKMBINDEX, TROPONINI in the last 168 hours. BNP (last 3 results) No results for input(s): PROBNP in the last 8760 hours. HbA1C: No results for input(s): HGBA1C in the last 72 hours. CBG: No results for input(s): GLUCAP in the last 168 hours. Lipid Profile: No results for input(s): CHOL, HDL, LDLCALC, TRIG, CHOLHDL, LDLDIRECT in the last 72 hours. Thyroid Function Tests: No results for input(s): TSH, T4TOTAL, FREET4, T3FREE, THYROIDAB in the last 72 hours. Anemia Panel: Recent Labs    02/22/20 0500 02/23/20 0455  FERRITIN 1,349* 1,020*      Radiology Studies: I have reviewed all of the imaging during this hospital visit personally     Scheduled Meds: . chlorpheniramine-HYDROcodone  5 mL Oral Q12H  . docusate sodium  200 mg Oral Daily  . enoxaparin (LOVENOX) injection  115 mg Subcutaneous Q12H  . Ipratropium-Albuterol  1 puff Inhalation Q6H  . mouth rinse  15 mL Mouth Rinse BID  . methylPREDNISolone (SOLU-MEDROL) injection  40 mg Intravenous Q12H  . multivitamin with minerals  1 tablet Oral Daily  . propranolol  ER  80 mg Oral Daily   Continuous Infusions: . remdesivir 100 mg in NS 100 mL Stopped (02/22/20 1050)     LOS: 3 days        Jacqui Headen Gerome Apley, MD

## 2020-02-24 LAB — COMPREHENSIVE METABOLIC PANEL
ALT: 95 U/L — ABNORMAL HIGH (ref 0–44)
AST: 81 U/L — ABNORMAL HIGH (ref 15–41)
Albumin: 2.3 g/dL — ABNORMAL LOW (ref 3.5–5.0)
Alkaline Phosphatase: 167 U/L — ABNORMAL HIGH (ref 38–126)
Anion gap: 11 (ref 5–15)
BUN: 25 mg/dL — ABNORMAL HIGH (ref 8–23)
CO2: 26 mmol/L (ref 22–32)
Calcium: 8 mg/dL — ABNORMAL LOW (ref 8.9–10.3)
Chloride: 100 mmol/L (ref 98–111)
Creatinine, Ser: 0.94 mg/dL (ref 0.61–1.24)
GFR calc Af Amer: >60 mL/min (ref >60)
GFR calc non Af Amer: >60 mL/min (ref >60)
Glucose, Bld: 96 mg/dL (ref 70–99)
Potassium: 4.6 mmol/L (ref 3.5–5.1)
Sodium: 137 mmol/L (ref 135–145)
Total Bilirubin: 0.8 mg/dL (ref 0.3–1.2)
Total Protein: 5.2 g/dL — ABNORMAL LOW (ref 6.5–8.1)

## 2020-02-24 LAB — CBC WITH DIFFERENTIAL/PLATELET
Abs Immature Granulocytes: 0.12 10*3/uL — ABNORMAL HIGH (ref 0.00–0.07)
Basophils Absolute: 0 10*3/uL (ref 0.0–0.1)
Basophils Relative: 1 %
Eosinophils Absolute: 0 10*3/uL (ref 0.0–0.5)
Eosinophils Relative: 0 %
HCT: 49.7 % (ref 39.0–52.0)
Hemoglobin: 16.5 g/dL (ref 13.0–17.0)
Immature Granulocytes: 1 %
Lymphocytes Relative: 8 %
Lymphs Abs: 0.7 10*3/uL (ref 0.7–4.0)
MCH: 32 pg (ref 26.0–34.0)
MCHC: 33.2 g/dL (ref 30.0–36.0)
MCV: 96.5 fL (ref 80.0–100.0)
Monocytes Absolute: 0.6 10*3/uL (ref 0.1–1.0)
Monocytes Relative: 7 %
Neutro Abs: 7.3 10*3/uL (ref 1.7–7.7)
Neutrophils Relative %: 83 %
Platelets: 205 10*3/uL (ref 150–400)
RBC: 5.15 MIL/uL (ref 4.22–5.81)
RDW: 13 % (ref 11.5–15.5)
WBC: 8.8 10*3/uL (ref 4.0–10.5)
nRBC: 0 % (ref 0.0–0.2)

## 2020-02-24 LAB — C-REACTIVE PROTEIN: CRP: 2.4 mg/dL — ABNORMAL HIGH (ref <1.0)

## 2020-02-24 LAB — FERRITIN: Ferritin: 1000 ng/mL — ABNORMAL HIGH (ref 24–336)

## 2020-02-24 LAB — D-DIMER, QUANTITATIVE: D-Dimer, Quant: >20 ug/mL-FEU — ABNORMAL HIGH (ref 0.00–0.50)

## 2020-02-24 NOTE — Progress Notes (Signed)
PROGRESS NOTE    Nate Common  ZGY:[id number] DOB: 11-Apr-1951 DOA: 02/20/2020 PCP: [name], MD    Brief Narrative:  Patient was admitted to the hospital with a working diagnosis of acute hypoxic respiratory failure due to SARS COVID-19 viral pneumonia.  69 year old male with past medical history for hypertension, diverticulosis, and irritable bowel syndrome who presents with worsening dyspnea. Patient tested for COVID-19 July 26, his symptoms were consistent with fever, chills generalized malaise. Over the following 2 days after diagnosis he had mild improvement of his symptoms, but for the last 5 days his symptoms have recurred and now more severe with myalgias, productive cough, fatigue, rhinorrhea, and sinus congestion. On the day of admission reported severe dyspnea while at rest, EMS was called in his oxygen saturation was 48% on room air. He was placed on 15 L per high flow nasal cannula/nonrebreather with oxygen saturation up to 90%, temperature 99.2, blood pressure 143/87, heart rate 80, respirate 26, his lungs had rhonchi bilaterally, positive wheezing no rales, heart S1-S2, present rhythmic, abdomen soft, no lower extremity edema. Sodium 131, potassium 3.6, chloride 90, bicarb 29, glucose 134, BUN 12, creatinine 0.89, AST 84, ALT 52,white count 6.5, hemoglobin 15.0, hematocrit 44.2, platelets 200 Chest radiograph with bilateral interstitial/alveolar infiltrates, all 4 quadrants, predominantly at bases, no effusions. EKG 92 bpm, left axis deviation, sinus rhythm, no ST segment or T wave changes.  Patient has been placed on [place] and non rebreather mask, he continue to have very high oxygen requirements.  Placed on high dose systemic steroids and antiviral therapy. Received Tocilizumab.   Positive left lower extremity DVT, and placed on full anticoagulation.     Assessment & Plan:   Principal Problem:   Pneumonia due to COVID-19 virus Active Problems:    Hypertension   Diverticulosis   Irritable bowel syndrome   Acute respiratory failure with hypoxia ([place])   1. Acute hypoxic respiratory failure due to SARS COVID 19 viral pneumonia.Sp tocilizumab 08/02.   RR: 20  Pulse oxymetry: 90 to 93%  Fi02: 35 L H-HFNC 100% Fi02  COVID-19 Labs  Recent Labs    02/22/20 0500 02/23/20 0455 02/24/20 0437  DDIMER >20.00* >20.00* >20.00*  FERRITIN 1,349* 1,020* 1,000*  CRP 10.9* 5.1* 2.4*    Lab Results  Component Value Date   SARSCOV2NAA DETECTED (A) 02/14/2020     Inflammatory markers slowly trending down, he continue to have dyspnea and generalized weakness.   Tolerating well medical therapy with methylprednisolone 40 mg IV q12, and antiviral therapy with remdesivir  #5/5.   On antitussive agents, bronchodilators and airway clearing techniques with incentive spirometer and flutter valve. Continue with of bed to chair tid with meals, and encourage prone position 3 to 4 hours at a time for a total of 16 hours a day as tolerated.   Will continue to follow inflammatory markers and decreased Fi02 as tolerated to target 02 saturation greater than 88%  2. New DVT left posterior tibial and peroneal veins.  Tolerating well anticoagulation with enoxaparin  2. HTN.Continue with propranolol with good toleration.   3. Hyponatremia.Stable renal function with serum cr at 0,94, Na is 137 and serum bicarbonate 26.  Continue to follow up renal function in am.     Patient continue to be at high risk for worsening respiratory failure   Status is: Inpatient  Remains inpatient appropriate because:IV treatments appropriate due to intensity of illness or inability to take PO   Dispo: The patient is from: Home  Anticipated d/c is to: Home              Anticipated d/c date is: 3 days              Patient currently is not medically stable to d/c.   DVT prophylaxis: Enoxaparin   Code Status:   full  Family Communication:  I  spoke over the phone with the patient's son about patient's  condition, plan of care, prognosis and all questions were addressed.      Subjective: Patient continue to have dyspnea, worse with exertion, he has been out of bed to chair and lateral decubitus in bed, no nausea or vomiting, continue to be very weak and deconditioned   Objective: Vitals:   02/24/20 0040 02/24/20 0400 02/24/20 0816 02/24/20 0851  BP: 136/80 130/74 (!) 168/89   Pulse:  (!) 54 60   Resp:  17 20   Temp:  98.2 F (36.8 C) 98 F (36.7 C)   TempSrc:  Oral Oral   SpO2: 92% 95% 90% 93%  Weight:      Height:        Intake/Output Summary (Last 24 hours) at 02/24/2020 0935 Last data filed at 02/24/2020 0425 Gross per 24 hour  Intake 120 ml  Output 850 ml  Net -730 ml   Filed Weights   02/21/20 1700  Weight: 115.7 kg    Examination:   General:  Positive dyspnea at rest, no not in pain  Neurology: Awake and alert, non focal  E ENT: mild pallor, no icterus, oral mucosa moist Cardiovascular: No JVD. S1-S2 present, rhythmic, no gallops, rubs, or murmurs. No lower extremity edema. Pulmonary: positive breath sounds bilaterally, Gastrointestinal. Abdomen soft and non tender Skin. No rashes Musculoskeletal: no joint deformities     Data Reviewed: I have personally reviewed following labs and imaging studies  CBC: Recent Labs  Lab 02/20/20 2006 02/20/20 2230 02/21/20 0415 02/21/20 0532 02/24/20 0437  WBC 6.5 6.8 5.4  --  8.8  NEUTROABS 5.6  --  4.8  --  7.3  HGB 15.0 15.2 15.3 16.0 16.5  HCT 44.2 44.2 45.2 47.0 49.7  MCV 91.9 90.8 91.5  --  96.5  PLT 200 193 192  --  347   Basic Metabolic Panel: Recent Labs  Lab 02/20/20 2006 02/20/20 2006 02/20/20 2230 02/21/20 0415 02/21/20 0532 02/22/20 0500 02/23/20 0455 02/24/20 0437  NA 131*   < >  --  133* 133* 134* 138 137  K 3.6   < >  --  4.0 3.7 4.0 4.2 4.6  CL 90*  --   --  92*  --  95* 98 100  CO2 29  --   --  30  --  28 32 26  GLUCOSE  134*  --   --  139*  --  142* 120* 96  BUN 12  --   --  12  --  20 22 25*  CREATININE 0.89   < > 0.87 0.79  --  0.79 0.90 0.94  CALCIUM 8.0*  --   --  7.8*  --  8.0* 8.0* 8.0*  MG  --   --   --  2.5*  --   --   --   --   PHOS  --   --   --  4.3  --   --   --   --    < > = values in this interval not displayed.   GFR: Estimated  Creatinine Clearance: 96 mL/min (by C-G formula based on SCr of 0.94 mg/dL). Liver Function Tests: Recent Labs  Lab 02/20/20 2006 02/21/20 0415 02/22/20 0500 02/23/20 0455 02/24/20 0437  AST 84* 83* 80* 61* 81*  ALT 52* 53* 67* 65* 95*  ALKPHOS 118 124 126 136* 167*  BILITOT 1.0 0.7 0.8 0.7 0.8  PROT 5.8* 5.7* 5.7* 5.3* 5.2*  ALBUMIN 2.5* 2.3* 2.3* 2.2* 2.3*   No results for input(s): LIPASE, AMYLASE in the last 168 hours. No results for input(s): AMMONIA in the last 168 hours. Coagulation Profile: No results for input(s): INR, PROTIME in the last 168 hours. Cardiac Enzymes: No results for input(s): CKTOTAL, CKMB, CKMBINDEX, TROPONINI in the last 168 hours. BNP (last 3 results) No results for input(s): PROBNP in the last 8760 hours. HbA1C: No results for input(s): HGBA1C in the last 72 hours. CBG: No results for input(s): GLUCAP in the last 168 hours. Lipid Profile: No results for input(s): CHOL, HDL, LDLCALC, TRIG, CHOLHDL, LDLDIRECT in the last 72 hours. Thyroid Function Tests: No results for input(s): TSH, T4TOTAL, FREET4, T3FREE, THYROIDAB in the last 72 hours. Anemia Panel: Recent Labs    02/23/20 0455 02/24/20 0437  FERRITIN 1,020* 1,000*      Radiology Studies: I have reviewed all of the imaging during this hospital visit personally     Scheduled Meds: . chlorpheniramine-HYDROcodone  5 mL Oral Q12H  . docusate sodium  200 mg Oral Daily  . enoxaparin (LOVENOX) injection  115 mg Subcutaneous Q12H  . Ipratropium-Albuterol  1 puff Inhalation Q6H  . mouth rinse  15 mL Mouth Rinse BID  . methylPREDNISolone (SOLU-MEDROL) injection   40 mg Intravenous Q12H  . multivitamin with minerals  1 tablet Oral Daily  . propranolol ER  80 mg Oral Daily   Continuous Infusions:   LOS: 4 days        Zymiere Trostle Gerome Apley, MD

## 2020-02-24 NOTE — Progress Notes (Signed)
Occupational Therapy Evaluation Patient Details Name: Ryan Turner MRN: [id number] DOB: Jul 11, 1951 Today's Date: 02/24/2020    History of Present Illness 69 year old male with past medical history for hypertension, diverticulosis, and irritable bowel syndrome who presents with worsening dyspnea.  Patient tested for COVID-19 July 26, his symptoms were consistent with fever, chills generalized malaise.  Over the following 2 days after diagnosis he had mild improvement of his symptoms, but for the last 5 days his symptoms have recurred and now more severe with myalgias, productive cough, fatigue, rhinorrhea, and sinus congestion.    Clinical Impression   Patient lives in a single level home with wife and is independent at baseline.  Patient's primary limitation at this time is activity tolerance.  He is able to complete ADLs at a supervision level (for safety and line management) but is easily fatigued and short of breath (dyspnea scale 2/4).  Patient on 35L O2 at 90% via [place] and SpO2 94 at rest, though with sitting up at EOB SpO2 decreased to 87, and with stand pivot decrease to 85.  Practiced pursed lip breathing and educated on breathing exercises.  Will continue to follow with OT acutely to address the deficits listed below.      Follow Up Recommendations  No OT follow up;Supervision - Intermittent    Equipment Recommendations  3 in 1 bedside commode    Recommendations for Other Services       Precautions / Restrictions Precautions Precautions: Fall Restrictions Weight Bearing Restrictions: No      Mobility Bed Mobility Overal bed mobility: Needs Assistance Bed Mobility: Supine to Sit     Supine to sit: Supervision     General bed mobility comments: Supervision for line management/safety  Transfers Overall transfer level: Needs assistance Equipment used: None Transfers: Stand Pivot Transfers;Sit to/from Stand Sit to Stand: Supervision Stand pivot transfers:  Supervision       General transfer comment: Supervision for asist with lines    Balance Overall balance assessment: No apparent balance deficits (not formally assessed)                                         ADL either performed or assessed with clinical judgement   ADL Overall ADL's : Needs assistance/impaired Eating/Feeding: Independent;Sitting                                   Functional mobility during ADLs: Supervision/safety General ADL Comments: Patient physcially able to complete all ADLs at this time with supervision for safety and lines, though limited by poor activity tolerance     Vision         Perception     Praxis      Pertinent Vitals/Pain Pain Assessment: No/denies pain     Hand Dominance Right   Extremity/Trunk Assessment Upper Extremity Assessment Upper Extremity Assessment: Overall WFL for tasks assessed   Lower Extremity Assessment Lower Extremity Assessment: Defer to PT evaluation       Communication Communication Communication: No difficulties   Cognition Arousal/Alertness: Awake/alert Behavior During Therapy: WFL for tasks assessed/performed Overall Cognitive Status: Within Functional Limits for tasks assessed  General Comments       Exercises     Shoulder Instructions      Home Living Family/patient expects to be discharged to:: Private residence Living Arrangements: Spouse/significant other Available Help at Discharge: Family;Other (Comment) (wife also COVID though doing better) Type of Home: House Home Access: Level entry     Home Layout: One level     Bathroom Shower/Tub: Walk-in shower         Home Equipment: Shower seat - built in          Prior Functioning/Environment Level of Independence: Independent                 OT Problem List: Decreased activity tolerance;Cardiopulmonary status limiting activity      OT  Treatment/Interventions: Self-care/ADL training;Therapeutic exercise;Energy conservation;Therapeutic activities;Patient/family education    OT Goals(Current goals can be found in the care plan section) Acute Rehab OT Goals Patient Stated Goal: to be on less O2 OT Goal Formulation: With patient Time For Goal Achievement: 03/09/20 Potential to Achieve Goals: Good  OT Frequency: Min 3X/week   Barriers to D/C:            Co-evaluation              AM-PAC OT "6 Clicks" Daily Activity     Outcome Measure Help from another person eating meals?: None Help from another person taking care of personal grooming?: A Little Help from another person toileting, which includes using toliet, bedpan, or urinal?: A Little Help from another person bathing (including washing, rinsing, drying)?: A Little Help from another person to put on and taking off regular upper body clothing?: A Little Help from another person to put on and taking off regular lower body clothing?: A Little 6 Click Score: 19   End of Session Equipment Utilized During Treatment: Oxygen Nurse Communication: Mobility status  Activity Tolerance: Patient tolerated treatment well Patient left: in chair;with call bell/phone within reach  OT Visit Diagnosis: Other (comment) (decreased cardiopulminary status)                Time: 3419-6222 OT Time Calculation (min): 21 min Charges:  OT General Charges $OT Visit: 1 Visit OT Evaluation $OT Eval Moderate Complexity: 1 [address] OTR/L   Phylliss Bob 02/24/2020, 12:22 PM

## 2020-02-25 LAB — CULTURE, BLOOD (ROUTINE X 2)
Culture: NO GROWTH
Culture: NO GROWTH
Special Requests: ADEQUATE

## 2020-02-25 LAB — COMPREHENSIVE METABOLIC PANEL
ALT: UNDETERMINED U/L (ref 0–44)
AST: 64 U/L — ABNORMAL HIGH (ref 15–41)
Albumin: 2.1 g/dL — ABNORMAL LOW (ref 3.5–5.0)
Alkaline Phosphatase: 164 U/L — ABNORMAL HIGH (ref 38–126)
Anion gap: 16 — ABNORMAL HIGH (ref 5–15)
BUN: 23 mg/dL (ref 8–23)
CO2: 18 mmol/L — ABNORMAL LOW (ref 22–32)
Calcium: 7.9 mg/dL — ABNORMAL LOW (ref 8.9–10.3)
Chloride: 101 mmol/L (ref 98–111)
Creatinine, Ser: 0.85 mg/dL (ref 0.61–1.24)
GFR calc Af Amer: >60 mL/min (ref >60)
GFR calc non Af Amer: >60 mL/min (ref >60)
Glucose, Bld: 78 mg/dL (ref 70–99)
Potassium: 5.6 mmol/L — ABNORMAL HIGH (ref 3.5–5.1)
Sodium: 135 mmol/L (ref 135–145)
Total Bilirubin: UNDETERMINED mg/dL (ref 0.3–1.2)
Total Protein: 4.9 g/dL — ABNORMAL LOW (ref 6.5–8.1)

## 2020-02-25 LAB — C-REACTIVE PROTEIN: CRP: 1 mg/dL — ABNORMAL HIGH (ref <1.0)

## 2020-02-25 LAB — FERRITIN: Ferritin: 848 ng/mL — ABNORMAL HIGH (ref 24–336)

## 2020-02-25 LAB — D-DIMER, QUANTITATIVE: D-Dimer, Quant: >20 ug/mL-FEU — ABNORMAL HIGH (ref 0.00–0.50)

## 2020-02-25 MED ORDER — SODIUM ZIRCONIUM CYCLOSILICATE 10 G PO PACK
10.0000 g | PACK | Freq: Two times a day (BID) | ORAL | Status: AC
  Administered 2020-02-25 (×2): 10 g via ORAL
  Filled 2020-02-25 (×2): qty 1

## 2020-02-25 NOTE — Progress Notes (Signed)
PROGRESS NOTE    Leemon Ayala  NUU:[id number] DOB: 1951/04/17 DOA: 02/20/2020 PCP: [name], MD    Brief Narrative:  Patient was admitted to the hospital with a working diagnosis of acute hypoxic respiratory failure due to SARS COVID-19 viral pneumonia.  69 year old male with past medical history for hypertension, diverticulosis, and irritable bowel syndrome who presents with worsening dyspnea. Patient tested for COVID-19 July 26, his symptoms were consistent with fever, chills generalized malaise. Over the following 2 days after diagnosis he had mild improvement of his symptoms, but for the last 5 days his symptoms have recurred and now more severe with myalgias, productive cough, fatigue, rhinorrhea, and sinus congestion. On the day of admission reported severe dyspnea while at rest, EMS was called in his oxygen saturation was 48% on room air. He was placed on 15 L per high flow nasal cannula/nonrebreather with oxygen saturation up to 90%, temperature 99.2, blood pressure 143/87, heart rate 80, respirate 26, his lungs had rhonchi bilaterally, positive wheezing no rales, heart S1-S2, present rhythmic, abdomen soft, no lower extremity edema. Sodium 131, potassium 3.6, chloride 90, bicarb 29, glucose 134, BUN 12, creatinine 0.89, AST 84, ALT 52,white count 6.5, hemoglobin 15.0, hematocrit 44.2, platelets 200 Chest radiograph with bilateral interstitial/alveolar infiltrates, all 4 quadrants, predominantly at bases, no effusions. EKG 92 bpm, left axis deviation, sinus rhythm, no ST segment or T wave changes.  Patient has been placed on [place] and non rebreather mask, he continue to have very high oxygen requirements.  Placed on high dose systemic steroids and antiviral therapy. Received Tocilizumab.   Positive left lower extremity DVT, and placed on full anticoagulation  Slowly improving inflammatory markers and oxygen requirements.    Assessment & Plan:   Principal Problem:    Pneumonia due to COVID-19 virus Active Problems:   Hypertension   Diverticulosis   Irritable bowel syndrome   Acute respiratory failure with hypoxia ([place])   1. Acute hypoxic respiratory failure due to SARS COVID 19 viral pneumonia.Sp tocilizumab 08/02. remdesivir #5/5  RR: 14  Pulse oxymetry: 96%  Fi02: 20 L min 80% Fi02 per H-HFNC  COVID-19 Labs  Recent Labs    02/23/20 0455 02/24/20 0437 02/25/20 0450  DDIMER >20.00* >20.00* >20.00*  FERRITIN 1,020* 1,000* 848*  CRP 5.1* 2.4* 1.0*    Lab Results  Component Value Date   SARSCOV2NAA DETECTED (A) 02/14/2020    Inflammatory markers continue to trend down, this am he has been transitioned to high flow nasal cannula 10 L with.   Continue medical therapy with methylprednisolone 40 mg IV q12. Now completed with antiviral therapy 5 days of remdesivir.   Continue with antitussive agents, bronchodilators and airway clearing techniques with incentive spirometer and flutter valve. Out of bed to chair tid with meals, not able to stay prone in bed, but has been on lateral decubitus.   Continue PT and OT.   2. New DVT left posterior tibial and peroneal veins. Continue anticoagulation with enoxaparin  2. HTN.On propranolol with good toleration.   3. Hyponatremia/ hyperkalemia/ anion gap metabolic acidosis .Patient is tolerating po well, his Na is 135 and K is up to 5,6. Serum bicarbonate at 18 from 26.  Will add 2 doses of sodium zirconium and will follow up on electrolytes in am. Hold on IV fluids for now. Not clear etiology of NEW anion gap metabolic acidosis, possible lactic acid due to hypoxemia. Continue supportive medical and respiratory therapy.      Patient continue to be  at high risk for worsening respiratory failure.   Status is: Inpatient  Remains inpatient appropriate because:IV treatments appropriate due to intensity of illness or inability to take PO   Dispo: The patient is from: Home               Anticipated d/c is to: Home              Anticipated d/c date is: > 3 days              Patient currently is not medically stable to d/c.   DVT prophylaxis: Enoxaparin   Code Status:   full  Family Communication:  I spoke over the phone with the patient's son about patient's  condition, plan of care, prognosis and all questions were addressed.    Subjective: Patient continue to be very weak and deconditioned, no nausea or vomiting, positive cough, no chest pain.   Objective: Vitals:   02/25/20 0400 02/25/20 0732 02/25/20 0933 02/25/20 0940  BP: 131/86 (!) 137/96 126/74   Pulse: 60 61 62 61  Resp: 17 16  14   Temp: 98.9 F (37.2 C) 98.1 F (36.7 C)    TempSrc: Axillary Oral    SpO2: 90% 93%  96%  Weight: 114.8 kg     Height:        Intake/Output Summary (Last 24 hours) at 02/25/2020 0950 Last data filed at 02/25/2020 0940 Gross per 24 hour  Intake 360 ml  Output 1500 ml  Net -1140 ml   Filed Weights   02/21/20 1700 02/25/20 0400  Weight: 115.7 kg 114.8 kg    Examination:   General: deconditioned and ill looking appearing  Neurology: Awake and alert, non focal  E ENT: mild pallor, no icterus, oral mucosa moist Cardiovascular: No JVD. S1-S2 present, rhythmic, no gallops, rubs, or murmurs. No lower extremity edema. Pulmonary: positive breath sounds bilaterally, no wheezing Gastrointestinal. Abdomen soft and non tender Skin. No rashes Musculoskeletal: no joint deformities     Data Reviewed: I have personally reviewed following labs and imaging studies  CBC: Recent Labs  Lab 02/20/20 2006 02/20/20 2230 02/21/20 0415 02/21/20 0532 02/24/20 0437  WBC 6.5 6.8 5.4  --  8.8  NEUTROABS 5.6  --  4.8  --  7.3  HGB 15.0 15.2 15.3 16.0 16.5  HCT 44.2 44.2 45.2 47.0 49.7  MCV 91.9 90.8 91.5  --  96.5  PLT 200 193 192  --  361   Basic Metabolic Panel: Recent Labs  Lab 02/21/20 0415 02/21/20 0415 02/21/20 0532 02/22/20 0500 02/23/20 0455 02/24/20 0437  02/25/20 0450  NA 133*   < > 133* 134* 138 137 135  K 4.0   < > 3.7 4.0 4.2 4.6 5.6*  CL 92*  --   --  95* 98 100 101  CO2 30  --   --  28 32 26 18*  GLUCOSE 139*  --   --  142* 120* 96 78  BUN 12  --   --  20 22 25* 23  CREATININE 0.79  --   --  0.79 0.90 0.94 0.85  CALCIUM 7.8*  --   --  8.0* 8.0* 8.0* 7.9*  MG 2.5*  --   --   --   --   --   --   PHOS 4.3  --   --   --   --   --   --    < > = values in this interval not  displayed.   GFR: Estimated Creatinine Clearance: 105.9 mL/min (by C-G formula based on SCr of 0.85 mg/dL). Liver Function Tests: Recent Labs  Lab 02/21/20 0415 02/22/20 0500 02/23/20 0455 02/24/20 0437 02/25/20 0450  AST 83* 80* 61* 81* 64*  ALT 53* 67* 65* 95* QUANTITY NOT SUFFICIENT, UNABLE TO PERFORM TEST  ALKPHOS 124 126 136* 167* 164*  BILITOT 0.7 0.8 0.7 0.8 QUANTITY NOT SUFFICIENT, UNABLE TO PERFORM TEST  PROT 5.7* 5.7* 5.3* 5.2* 4.9*  ALBUMIN 2.3* 2.3* 2.2* 2.3* 2.1*   No results for input(s): LIPASE, AMYLASE in the last 168 hours. No results for input(s): AMMONIA in the last 168 hours. Coagulation Profile: No results for input(s): INR, PROTIME in the last 168 hours. Cardiac Enzymes: No results for input(s): CKTOTAL, CKMB, CKMBINDEX, TROPONINI in the last 168 hours. BNP (last 3 results) No results for input(s): PROBNP in the last 8760 hours. HbA1C: No results for input(s): HGBA1C in the last 72 hours. CBG: No results for input(s): GLUCAP in the last 168 hours. Lipid Profile: No results for input(s): CHOL, HDL, LDLCALC, TRIG, CHOLHDL, LDLDIRECT in the last 72 hours. Thyroid Function Tests: No results for input(s): TSH, T4TOTAL, FREET4, T3FREE, THYROIDAB in the last 72 hours. Anemia Panel: Recent Labs    02/24/20 0437 02/25/20 0450  FERRITIN 1,000* 848*      Radiology Studies: I have reviewed all of the imaging during this hospital visit personally     Scheduled Meds: . chlorpheniramine-HYDROcodone  5 mL Oral Q12H  . docusate  sodium  200 mg Oral Daily  . enoxaparin (LOVENOX) injection  115 mg Subcutaneous Q12H  . Ipratropium-Albuterol  1 puff Inhalation Q6H  . mouth rinse  15 mL Mouth Rinse BID  . methylPREDNISolone (SOLU-MEDROL) injection  40 mg Intravenous Q12H  . multivitamin with minerals  1 tablet Oral Daily  . propranolol ER  80 mg Oral Daily   Continuous Infusions:   LOS: 5 days        Lyah Millirons Gerome Apley, MD

## 2020-02-25 NOTE — Progress Notes (Signed)
Patient is currently siting in the chair,  on 10 L HFNC , oxygen sat 94% with no complaints of SOB or discomfort. Patient educated on incentive spirometer and flutter valve and proning in bed.

## 2020-02-25 NOTE — Evaluation (Signed)
Physical Therapy Evaluation Patient Details Name: Ryan Turner MRN: [id number] DOB: 09/20/1950 Today's Date: 02/25/2020   History of Present Illness  Pt is 69 yo male with past medical history for hypertension, diverticulosis, and irritable bowel syndrome who presents with worsening dyspnea.  Patient tested positive for COVID-19 on July 26.  His symptoms initially improved but then worsened and now admitted with resp failure due to COVID 19 viral PNE. Additionally with + R LE DVT on anticoagulation.  Clinical Impression  Pt admitted with above diagnosis. Pt requiring supervision to min guard for mobility.  Main limitations in functional endurance with decreasing O2 sats and mild instability with balance.  Pt was on 10 L HHFNC with sats dropping to 86% and taking 2 minutes to recover.  Pt very motivated to improve. Pt currently with functional limitations due to the deficits listed below (see PT Problem List). Pt will benefit from skilled PT to increase their independence and safety with mobility to allow discharge to the venue listed below.       Follow Up Recommendations Home health PT    Equipment Recommendations  Rolling walker with 5" wheels (may progress past needing RW)    Recommendations for Other Services       Precautions / Restrictions Precautions Precautions: Fall Precaution Comments: watch sats      Mobility  Bed Mobility               General bed mobility comments: in chair  Transfers Overall transfer level: Needs assistance Equipment used: Rolling walker (2 wheeled) Transfers: Sit to/from Stand Sit to Stand: Supervision         General transfer comment: Supervision and assist with all lines; pt reports feels unsteady so utilized RW; sit to stand x 3 during session  Ambulation/Gait Ambulation/Gait assistance: Min guard Gait Distance (Feet): 30 Feet (additionally marched in place 1.5 mins x 2; rest breaks between each activity) Assistive device:  Rolling walker (2 wheeled) Gait Pattern/deviations: Trunk flexed Gait velocity: decreased   General Gait Details: Pt ambulated 30' and then marched in place for 1.5 mins x 2.  Took seated rest break between each activity.  Min guard for safety and cues for breathing technique. See comments for vitals  Stairs            Wheelchair Mobility    Modified Rankin (Stroke Patients Only)       Balance Overall balance assessment: Needs assistance   Sitting balance-Leahy Scale: Normal     Standing balance support: Bilateral upper extremity supported;No upper extremity supported Standing balance-Leahy Scale: Fair Standing balance comment: Felt better with RW but was stable statically without                             Pertinent Vitals/Pain Pain Assessment: No/denies pain    Home Living Family/patient expects to be discharged to:: Private residence Living Arrangements: Spouse/significant other Available Help at Discharge: Family;Other (Comment) (wife who also has COVID) Type of Home: House Home Access: Level entry     Home Layout: One level Home Equipment: Shower seat - built in      Prior Function Level of Independence: Independent         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, newspaper   Dominant Hand: Right    Extremity/Trunk Assessment   Upper Extremity Assessment Upper Extremity Assessment: Defer to OT evaluation    Lower Extremity Assessment Lower  Extremity Assessment: LLE deficits/detail;RLE deficits/detail RLE Deficits / Details: ROM WFL; MMT 5/5 LLE Deficits / Details: ROM WFL; MMT 5/5    Cervical / Trunk Assessment Cervical / Trunk Assessment: Normal  Communication   Communication: No difficulties  Cognition Arousal/Alertness: Awake/alert Behavior During Therapy: WFL for tasks assessed/performed Overall Cognitive Status: Within Functional Limits for tasks assessed                                         General Comments General comments (skin integrity, edema, etc.): Pt was on 10 L HHFNC.  His sats were 92% rest, 88% activity, 86% coughing after activity, took 2 mins to recover to 92%.  Similar results for each bout of activity.  Pt was educated on O2 saturation numbers. Also, educated on performing increased activity when up with nursing (example marching in place when he gets up from chair)    Exercises     Assessment/Plan    PT Assessment Patient needs continued PT services  PT Problem List Decreased strength;Decreased mobility;Decreased knowledge of precautions;Decreased activity tolerance;Cardiopulmonary status limiting activity;Decreased balance;Decreased knowledge of use of DME       PT Treatment Interventions DME instruction;Therapeutic activities;Gait training;Therapeutic exercise;Patient/family education;Balance training;Functional mobility training    PT Goals (Current goals can be found in the Care Plan section)  Acute Rehab PT Goals Patient Stated Goal: recover and go home PT Goal Formulation: With patient Time For Goal Achievement: 03/10/20 Potential to Achieve Goals: Good    Frequency Min 3X/week   Barriers to discharge        Co-evaluation               AM-PAC PT "6 Clicks" Mobility  Outcome Measure Help needed turning from your back to your side while in a flat bed without using bedrails?: None Help needed moving from lying on your back to sitting on the side of a flat bed without using bedrails?: None Help needed moving to and from a bed to a chair (including a wheelchair)?: None Help needed standing up from a chair using your arms (e.g., wheelchair or bedside chair)?: A Little Help needed to walk in hospital room?: A Little Help needed climbing 3-5 steps with a railing? : A Lot 6 Click Score: 20    End of Session Equipment Utilized During Treatment: Oxygen Activity Tolerance: Patient tolerated treatment well Patient left: with call bell/phone  within reach;in chair Nurse Communication: Mobility status PT Visit Diagnosis: Other abnormalities of gait and mobility (R26.89)    Time: 4888-9169 PT Time Calculation (min) (ACUTE ONLY): 26 min   Charges:   PT Evaluation $PT Eval Moderate Complexity: 1 Mod PT Treatments $Therapeutic Activity: 8-22 mins        Abran Richard, PT Acute Rehab Services Pager [phone] [facility] [phone]    Karlton Lemon 02/25/2020, 2:48 PM

## 2020-02-26 LAB — BASIC METABOLIC PANEL
Anion gap: 9 (ref 5–15)
BUN: 20 mg/dL (ref 8–23)
CO2: 27 mmol/L (ref 22–32)
Calcium: 8.1 mg/dL — ABNORMAL LOW (ref 8.9–10.3)
Chloride: 95 mmol/L — ABNORMAL LOW (ref 98–111)
Creatinine, Ser: 0.86 mg/dL (ref 0.61–1.24)
GFR calc Af Amer: >60 mL/min (ref >60)
GFR calc non Af Amer: >60 mL/min (ref >60)
Glucose, Bld: 99 mg/dL (ref 70–99)
Potassium: 4.9 mmol/L (ref 3.5–5.1)
Sodium: 131 mmol/L — ABNORMAL LOW (ref 135–145)

## 2020-02-26 LAB — C-REACTIVE PROTEIN: CRP: 0.7 mg/dL (ref <1.0)

## 2020-02-26 LAB — FERRITIN: Ferritin: 837 ng/mL — ABNORMAL HIGH (ref 24–336)

## 2020-02-26 LAB — D-DIMER, QUANTITATIVE: D-Dimer, Quant: 19.3 ug/mL-FEU — ABNORMAL HIGH (ref 0.00–0.50)

## 2020-02-26 MED ORDER — PANTOPRAZOLE SODIUM 40 MG PO TBEC
40.0000 mg | DELAYED_RELEASE_TABLET | Freq: Every day | ORAL | Status: DC
  Administered 2020-02-27 – 2020-03-03 (×6): 40 mg via ORAL
  Filled 2020-02-26 (×6): qty 1

## 2020-02-26 NOTE — Progress Notes (Signed)
PROGRESS NOTE                                                                                                                                                                                                             Patient Demographics:    Ryan Turner, is a 69 y.o. male, DOB - June 12, 1951, XNA:[id number]  Outpatient Primary MD for the patient is [name], MD   Admit date - 02/20/2020   LOS - 6  Chief Complaint  Patient presents with  . Shortness of Breath       Brief Narrative: Patient is a 69 y.o. male with PMHx of HTN, IBS-who was diagnosed with COVID-19 on 7/26-presented to the ED on 8/1 with worsening shortness of breath-he was found to have acute hypoxic respiratory failure secondary to COVID-19 pneumonia.   Significant Events: 8/1>> Admit to [facility] for hypoxia from COVID-19 pneumonia.  Significant studies: 8/1>>Chest x-ray: Moderate to advanced bilateral heterogeneous lung opacities consistent with Covid PNA. 8/4>> left lower extremity Doppler: DVT involving posterior tibial and left peroneal vein.  COVID-19 medications: Steroids: 8/1>> Remdesivir: 8/1>> 8/5 Actemra: 8/2 x 1  Antibiotics: None  Microbiology data: 8/1 >>blood culture: No growth  Procedures: None  Consults: None  DVT prophylaxis: SCDs Start: 02/20/20 2231 Lovenox at therapeutic dosing    Subjective:    Ryan Turner today remains essentially the same-on HFNC.  Acknowledges shortness of breath with minimal movement.   Assessment  & Plan :   Acute Hypoxic Resp Failure due to Covid 19 Viral pneumonia: Continues to have severe hypoxemia-on HFNC and NRB this morning-was titrated off NRB-still requiring on 15 L of HFNC.  Inflammatory markers are slowly downtrending.  Plans are to continue steroids.  Has completed a course of remdesivir.  Fever: afebrile  O2 requirements:  SpO2: 92 % O2 Flow Rate (L/min): 15  L/min FiO2 (%): 100 %   COVID-19 Labs: Recent Labs    02/24/20 0437 02/25/20 0450 02/26/20 0656  DDIMER >20.00* >20.00* 19.30*  FERRITIN 1,000* 848* 837*  CRP 2.4* 1.0* 0.7    No results found for: BNP  Recent Labs  Lab 02/20/20 2230  PROCALCITON 0.37    Lab Results  Component Value Date   SARSCOV2NAA DETECTED (A) 02/14/2020     Prone/Incentive Spirometry: encouraged patient to lie prone for 3-4 hours at a  time for a total of 16 hours a day, and to encourage incentive spirometry use 3-4/hour.  Left lower extremity DVT: Provoked by COVID-19-on full dose Lovenox-we will consider transitioning to novel agents in the next few days.  Transaminitis: Secondary to COVID-19-follow closely.  HTN: BP stable-continue propanolol.  Hyperkalemia: Resolved  Mild hyponatremia: Stable for follow-up-no further work-up required.  Obesity: Estimated body mass index is 33.39 kg/m as calculated from the following:   Height as of this encounter: 6\' 1"  (1.854 m).   Weight as of this encounter: 114.8 kg.   GI prophylaxis: PPI  ABG:    Component Value Date/Time   PHART 7.480 (H) 02/21/2020 0532   PCO2ART 42.3 02/21/2020 0532   PO2ART 46 (L) 02/21/2020 0532   HCO3 31.4 (H) 02/21/2020 0532   TCO2 33 (H) 02/21/2020 0532   O2SAT 84.0 02/21/2020 0532    Vent Settings: FiO2 (%):  [80 %-100 %] 100 %  Condition - Extremely Guarded  Family Communication  :  Spouse ([phone]) and son [phone]) updated over the phone 8/7  Code Status : DNR-RN at bedside when we discussed CODE STATUS this morning.  Spouse and Son updated about change in [place] on 8/7.  Diet :  Diet Order            Diet Heart Room service appropriate? Yes; Fluid consistency: Thin  Diet effective now                  Disposition Plan  :   Status is: Inpatient  Remains inpatient appropriate because:Inpatient level of care appropriate due to severity of illness   Dispo: The patient is from: Home               Anticipated d/c is to: Home              Anticipated d/c date is: > 3 days              Patient currently is not medically stable to d/c.   Barriers to discharge: Severe hypoxemia secondary to COVID-19 on high flow oxygen.  Antimicorbials  :    Anti-infectives (From admission, onward)   Start     Dose/Rate Route Frequency Ordered Stop   02/21/20 1000  remdesivir 100 mg in sodium chloride 0.9 % 100 mL IVPB       "Followed by" Linked Group Details   100 mg 200 mL/hr over 30 Minutes Intravenous Daily 02/20/20 2006 02/24/20 0932   02/20/20 2015  remdesivir 200 mg in sodium chloride 0.9% 250 mL IVPB       "Followed by" Linked Group Details   200 mg 580 mL/hr over 30 Minutes Intravenous Once 02/20/20 2006 02/20/20 2151      Inpatient Medications  Scheduled Meds: . chlorpheniramine-HYDROcodone  5 mL Oral Q12H  . docusate sodium  200 mg Oral Daily  . enoxaparin (LOVENOX) injection  115 mg Subcutaneous Q12H  . Ipratropium-Albuterol  1 puff Inhalation Q6H  . mouth rinse  15 mL Mouth Rinse BID  . methylPREDNISolone (SOLU-MEDROL) injection  40 mg Intravenous Q12H  . multivitamin with minerals  1 tablet Oral Daily  . propranolol ER  80 mg Oral Daily   Continuous Infusions: PRN Meds:.acetaminophen, albuterol, fluticasone, guaiFENesin-dextromethorphan, polyethylene glycol, zolpidem   Time Spent in minutes  25  See all Orders from today for further details   Oren Binet M.D on 02/26/2020 at 3:12 PM  To page go to [email or web address] - use universal password  [facility] -  Office  [phone]    Objective:   Vitals:   02/25/20 1600 02/25/20 2007 02/25/20 2331 02/26/20 0324  BP: 130/77 (!) 174/77 (!) 149/89 134/84  Pulse: 60 62 60 60  Resp: 17 19 18 16   Temp: 98.3 F (36.8 C) 98 F (36.7 C) 97.8 F (36.6 C) 97.7 F (36.5 C)  TempSrc: Oral Oral Axillary Axillary  SpO2: 90% 95% 93% 92%  Weight:      Height:        Wt Readings from Last 3 Encounters:   02/25/20 114.8 kg  02/14/20 (!) 116.6 kg  07/14/19 116.1 kg     Intake/Output Summary (Last 24 hours) at 02/26/2020 1512 Last data filed at 02/26/2020 0909 Gross per 24 hour  Intake 600 ml  Output 1900 ml  Net -1300 ml     Physical Exam Gen Exam:Alert awake-not in any distress HEENT:atraumatic, normocephalic Chest: B/L rales CVS:S1S2 regular Abdomen:soft non tender, non distended Extremities:no edema Neurology: Non focal Skin: no rash   Data Review:    CBC Recent Labs  Lab 02/20/20 2006 02/20/20 2230 02/21/20 0415 02/21/20 0532 02/24/20 0437  WBC 6.5 6.8 5.4  --  8.8  HGB 15.0 15.2 15.3 16.0 16.5  HCT 44.2 44.2 45.2 47.0 49.7  PLT 200 193 192  --  205  MCV 91.9 90.8 91.5  --  96.5  MCH 31.2 31.2 31.0  --  32.0  MCHC 33.9 34.4 33.8  --  33.2  RDW 12.7 12.7 12.9  --  13.0  LYMPHSABS 0.5*  --  0.3*  --  0.7  MONOABS 0.4  --  0.2  --  0.6  EOSABS 0.0  --  0.0  --  0.0  BASOSABS 0.0  --  0.0  --  0.0    Chemistries  Recent Labs  Lab 02/21/20 0415 02/21/20 0532 02/22/20 0500 02/23/20 0455 02/24/20 0437 02/25/20 0450 02/26/20 0656  NA 133*   < > 134* 138 137 135 131*  K 4.0   < > 4.0 4.2 4.6 5.6* 4.9  CL 92*   < > 95* 98 100 101 95*  CO2 30   < > 28 32 26 18* 27  GLUCOSE 139*   < > 142* 120* 96 78 99  BUN 12   < > 20 22 25* 23 20  CREATININE 0.79   < > 0.79 0.90 0.94 0.85 0.86  CALCIUM 7.8*   < > 8.0* 8.0* 8.0* 7.9* 8.1*  MG 2.5*  --   --   --   --   --   --   AST 83*  --  80* 61* 81* 64*  --   ALT 53*  --  67* 65* 95* QUANTITY NOT SUFFICIENT, UNABLE TO PERFORM TEST  --   ALKPHOS 124  --  126 136* 167* 164*  --   BILITOT 0.7  --  0.8 0.7 0.8 QUANTITY NOT SUFFICIENT, UNABLE TO PERFORM TEST  --    < > = values in this interval not displayed.   ------------------------------------------------------------------------------------------------------------------ No results for input(s): CHOL, HDL, LDLCALC, TRIG, CHOLHDL, LDLDIRECT in the last 72 hours.  No  results found for: HGBA1C ------------------------------------------------------------------------------------------------------------------ No results for input(s): TSH, T4TOTAL, T3FREE, THYROIDAB in the last 72 hours.  Invalid input(s): FREET3 ------------------------------------------------------------------------------------------------------------------ Recent Labs    02/25/20 0450 02/26/20 0656  FERRITIN 848* 837*    Coagulation profile No results for input(s): INR, PROTIME in the last 168 hours.  Recent Labs  02/25/20 0450 02/26/20 0656  DDIMER >20.00* 19.30*    Cardiac Enzymes No results for input(s): CKMB, TROPONINI, MYOGLOBIN in the last 168 hours.  Invalid input(s): CK ------------------------------------------------------------------------------------------------------------------ No results found for: BNP  Micro Results Recent Results (from the past 240 hour(s))  Blood Culture (routine x 2)     Status: None   Collection Time: 02/20/20  8:06 PM   Specimen: BLOOD RIGHT HAND  Result Value Ref Range Status   Specimen Description BLOOD RIGHT HAND  Final   Special Requests   Final    BOTTLES DRAWN AEROBIC AND ANAEROBIC Blood Culture adequate volume   Culture   Final    NO GROWTH 5 DAYS Performed at [facility], [address] [place], [place] [address]    Report Status 02/25/2020 FINAL  Final  Blood Culture (routine x 2)     Status: None   Collection Time: 02/20/20  8:07 PM   Specimen: BLOOD LEFT HAND  Result Value Ref Range Status   Specimen Description BLOOD LEFT HAND  Final   Special Requests   Final    BOTTLES DRAWN AEROBIC AND ANAEROBIC Blood Culture results may not be optimal due to an inadequate volume of blood received in culture bottles   Culture   Final    NO GROWTH 5 DAYS Performed at [facility], [place] [address] [place], [place] [address]    Report Status 02/25/2020 FINAL  Final  MRSA PCR Screening     Status: None    Collection Time: 02/23/20  6:46 AM   Specimen: Nasal Mucosa; Nasopharyngeal  Result Value Ref Range Status   MRSA by PCR NEGATIVE NEGATIVE Final    Comment:        The GeneXpert MRSA Assay (FDA approved for NASAL specimens only), is one component of a comprehensive MRSA colonization surveillance program. It is not intended to diagnose MRSA infection nor to guide or monitor treatment for MRSA infections. Performed at [facility], [place] [address], [place], [place] [address]     Radiology Reports DG Chest [place] 1 View  Result Date: 02/20/2020 CLINICAL DATA:  Shortness of breath.  COVID positive 1 week ago. EXAM: PORTABLE CHEST 1 VIEW COMPARISON:  None. FINDINGS: Cardiomegaly with normal mediastinal contours. Moderate to advanced heterogeneous bilateral lung opacities in a mid lower lung zone predominant distribution. No evidence of pneumothorax and pneumomediastinum. No large pleural effusion. No acute osseous abnormalities are seen. IMPRESSION: Moderate to advanced bilateral heterogeneous lung opacities consistent with COVID pneumonia. Electronically Signed   By: Keith Rake M.D.   On: 02/20/2020 21:13   VAS [place] LOWER EXTREMITY VENOUS (DVT)  Result Date: 02/22/2020  Lower Venous DVTStudy Indications: Elevated d-dimer, and Edema.  Comparison Study: Prior study 09-23-2016 LEV RT Performing Technologist: Darlin Coco  Examination Guidelines: A complete evaluation includes B-mode imaging, spectral Doppler, color Doppler, and power Doppler as needed of all accessible portions of each vessel. Bilateral testing is considered an integral part of a complete examination. Limited examinations for reoccurring indications may be performed as noted. The reflux portion of the exam is performed with the patient in reverse Trendelenburg.  +---------+---------------+---------+-----------+----------+--------------+ RIGHT    CompressibilityPhasicitySpontaneityPropertiesThrombus Aging  +---------+---------------+---------+-----------+----------+--------------+ CFV      Full           Yes      Yes                                 +---------+---------------+---------+-----------+----------+--------------+  SFJ      Full                                                        +---------+---------------+---------+-----------+----------+--------------+ FV Prox  Full                                                        +---------+---------------+---------+-----------+----------+--------------+ FV Mid   Full                                                        +---------+---------------+---------+-----------+----------+--------------+ FV DistalFull                                                        +---------+---------------+---------+-----------+----------+--------------+ PFV      Full                                                        +---------+---------------+---------+-----------+----------+--------------+ POP      Full           Yes      Yes                                 +---------+---------------+---------+-----------+----------+--------------+ PTV      Full                                                        +---------+---------------+---------+-----------+----------+--------------+ PERO     Full                                                        +---------+---------------+---------+-----------+----------+--------------+   +---------+---------------+---------+-----------+----------+--------------+ LEFT     CompressibilityPhasicitySpontaneityPropertiesThrombus Aging +---------+---------------+---------+-----------+----------+--------------+ CFV      Full           Yes      Yes                                 +---------+---------------+---------+-----------+----------+--------------+ SFJ      Full                                                         +---------+---------------+---------+-----------+----------+--------------+  FV Prox  Full                                                        +---------+---------------+---------+-----------+----------+--------------+ FV Mid   Full                                                        +---------+---------------+---------+-----------+----------+--------------+ FV DistalFull                                                        +---------+---------------+---------+-----------+----------+--------------+ PFV      Full                                                        +---------+---------------+---------+-----------+----------+--------------+ POP      Full           Yes      Yes                                 +---------+---------------+---------+-----------+----------+--------------+ PTV      Partial                                                     +---------+---------------+---------+-----------+----------+--------------+ PERO     Partial                                                     +---------+---------------+---------+-----------+----------+--------------+     Summary: RIGHT: - There is no evidence of deep vein thrombosis in the lower extremity.  - No cystic structure found in the popliteal fossa.  LEFT: - Findings consistent with acute deep vein thrombosis involving the left posterior tibial veins, and left peroneal veins. - No cystic structure found in the popliteal fossa.  *See table(s) above for measurements and observations. Electronically signed by Deitra Mayo MD on 02/22/2020 at 8:08:47 PM.    Final

## 2020-02-27 LAB — COMPREHENSIVE METABOLIC PANEL
ALT: 82 U/L — ABNORMAL HIGH (ref 0–44)
AST: 50 U/L — ABNORMAL HIGH (ref 15–41)
Albumin: 2.4 g/dL — ABNORMAL LOW (ref 3.5–5.0)
Alkaline Phosphatase: 136 U/L — ABNORMAL HIGH (ref 38–126)
Anion gap: 12 (ref 5–15)
BUN: 23 mg/dL (ref 8–23)
CO2: 25 mmol/L (ref 22–32)
Calcium: 8.6 mg/dL — ABNORMAL LOW (ref 8.9–10.3)
Chloride: 97 mmol/L — ABNORMAL LOW (ref 98–111)
Creatinine, Ser: 0.9 mg/dL (ref 0.61–1.24)
GFR calc Af Amer: >60 mL/min (ref >60)
GFR calc non Af Amer: >60 mL/min (ref >60)
Glucose, Bld: 120 mg/dL — ABNORMAL HIGH (ref 70–99)
Potassium: 4.8 mmol/L (ref 3.5–5.1)
Sodium: 134 mmol/L — ABNORMAL LOW (ref 135–145)
Total Bilirubin: 0.6 mg/dL (ref 0.3–1.2)
Total Protein: 5 g/dL — ABNORMAL LOW (ref 6.5–8.1)

## 2020-02-27 LAB — C-REACTIVE PROTEIN: CRP: 1.1 mg/dL — ABNORMAL HIGH (ref <1.0)

## 2020-02-27 LAB — CBC
HCT: 48.5 % (ref 39.0–52.0)
Hemoglobin: 16.5 g/dL (ref 13.0–17.0)
MCH: 31.8 pg (ref 26.0–34.0)
MCHC: 34 g/dL (ref 30.0–36.0)
MCV: 93.4 fL (ref 80.0–100.0)
Platelets: 145 10*3/uL — ABNORMAL LOW (ref 150–400)
RBC: 5.19 MIL/uL (ref 4.22–5.81)
RDW: 13 % (ref 11.5–15.5)
WBC: 9.1 10*3/uL (ref 4.0–10.5)
nRBC: 0 % (ref 0.0–0.2)

## 2020-02-27 LAB — D-DIMER, QUANTITATIVE: D-Dimer, Quant: 16.05 ug/mL-FEU — ABNORMAL HIGH (ref 0.00–0.50)

## 2020-02-27 LAB — FERRITIN: Ferritin: 905 ng/mL — ABNORMAL HIGH (ref 24–336)

## 2020-02-27 NOTE — Progress Notes (Signed)
PROGRESS NOTE                                                                                                                                                                                                             Patient Demographics:    Ryan Turner, is a 69 y.o. male, DOB - 1950/08/14, ELF:[id number]  Outpatient Primary MD for the patient is [name], MD   Admit date - 02/20/2020   LOS - 7  Chief Complaint  Patient presents with  . Shortness of Breath       Brief Narrative: Patient is a 69 y.o. male with PMHx of HTN, IBS-who was diagnosed with COVID-19 on 7/26-presented to the ED on 8/1 with worsening shortness of breath-he was found to have acute hypoxic respiratory failure secondary to COVID-19 pneumonia.   Significant Events: 8/1>> Admit to [facility] for hypoxia from COVID-19 pneumonia.  Significant studies: 8/1>>Chest x-ray: Moderate to advanced bilateral heterogeneous lung opacities consistent with Covid PNA. 8/4>> left lower extremity Doppler: DVT involving posterior tibial and left peroneal vein.  COVID-19 medications: Steroids: 8/1>> Remdesivir: 8/1>> 8/5 Actemra: 8/2 x 1  Antibiotics: None  Microbiology data: 8/1 >>blood culture: No growth  Procedures: None  Consults: None  DVT prophylaxis: SCDs Start: 02/20/20 2231 Lovenox at therapeutic dosing    Subjective:    Ryan Turner today remains essentially the same-remains on heated high flow.   Assessment  & Plan :   Acute Hypoxic Resp Failure due to Covid 19 Viral pneumonia: Continues to have severe hypoxemia requiring heated high flow-remains essentially the same as yesterday.  Remains on steroids-has completed a course of remdesivir.  No signs of volume overload on exam.  Plans are to continue close monitoring and continue at attempts to titrate down FiO2.    Fever: afebrile  O2 requirements:  SpO2: (!) 89 % O2 Flow Rate  (L/min): 15 L/min FiO2 (%): 100 %   COVID-19 Labs: Recent Labs    02/25/20 0450 02/26/20 0656  DDIMER >20.00* 19.30*  FERRITIN 848* 837*  CRP 1.0* 0.7    No results found for: BNP  Recent Labs  Lab 02/20/20 2230  PROCALCITON 0.37    Lab Results  Component Value Date   SARSCOV2NAA DETECTED (A) 02/14/2020     Prone/Incentive Spirometry: encouraged patient to lie prone for 3-4 hours at  a time for a total of 16 hours a day, and to encourage incentive spirometry use 3-4/hour.  Left lower extremity DVT: Provoked by COVID-19-on full dose Lovenox-we will consider transitioning to novel agents in the next few days.  Transaminitis: Secondary to COVID-19-follow closely.  HTN: BP stable-continue propanolol.  Hyperkalemia: Resolved  Mild hyponatremia: Stable for follow-up-no further work-up required.  Obesity: Estimated body mass index is 33.39 kg/m as calculated from the following:   Height as of this encounter: 6\' 1"  (1.854 m).   Weight as of this encounter: 114.8 kg.   GI prophylaxis: PPI  ABG:    Component Value Date/Time   PHART 7.480 (H) 02/21/2020 0532   PCO2ART 42.3 02/21/2020 0532   PO2ART 46 (L) 02/21/2020 0532   HCO3 31.4 (H) 02/21/2020 0532   TCO2 33 (H) 02/21/2020 0532   O2SAT 84.0 02/21/2020 0532    Vent Settings: FiO2 (%):  [100 %] 100 %  Condition - Extremely Guarded  Family Communication  :  Spouse ([email or web address]-[phone]) updated on 8/8. Will update son Ryan Turner- [phone]) on 8/9  Code Status : DNR-RN at bedside when we discussed CODE STATUS this morning.  Spouse and Son updated about change in [place] on 8/7.  Diet :  Diet Order            Diet Heart Room service appropriate? Yes; Fluid consistency: Thin  Diet effective now                  Disposition Plan  :   Status is: Inpatient  Remains inpatient appropriate because:Inpatient level of care appropriate due to severity of illness   Dispo: The patient is from: Home               Anticipated d/c is to: Home              Anticipated d/c date is: > 3 days              Patient currently is not medically stable to d/c.   Barriers to discharge: Severe hypoxemia secondary to COVID-19 on high flow oxygen.  Antimicorbials  :    Anti-infectives (From admission, onward)   Start     Dose/Rate Route Frequency Ordered Stop   02/21/20 1000  remdesivir 100 mg in sodium chloride 0.9 % 100 mL IVPB       "Followed by" Linked Group Details   100 mg 200 mL/hr over 30 Minutes Intravenous Daily 02/20/20 2006 02/24/20 0932   02/20/20 2015  remdesivir 200 mg in sodium chloride 0.9% 250 mL IVPB       "Followed by" Linked Group Details   200 mg 580 mL/hr over 30 Minutes Intravenous Once 02/20/20 2006 02/20/20 2151      Inpatient Medications  Scheduled Meds: . chlorpheniramine-HYDROcodone  5 mL Oral Q12H  . docusate sodium  200 mg Oral Daily  . enoxaparin (LOVENOX) injection  115 mg Subcutaneous Q12H  . Ipratropium-Albuterol  1 puff Inhalation Q6H  . mouth rinse  15 mL Mouth Rinse BID  . methylPREDNISolone (SOLU-MEDROL) injection  40 mg Intravenous Q12H  . multivitamin with minerals  1 tablet Oral Daily  . pantoprazole  40 mg Oral Q1200  . propranolol ER  80 mg Oral Daily   Continuous Infusions: PRN Meds:.acetaminophen, albuterol, fluticasone, guaiFENesin-dextromethorphan, polyethylene glycol, zolpidem   Time Spent in minutes  25  See all Orders from today for further details   Oren Binet M.D on 02/27/2020 at 11:34 AM  To  page go to [email or web address] - use universal password  [facility] -  Office  [phone]    Objective:   Vitals:   02/27/20 0000 02/27/20 0400 02/27/20 0741 02/27/20 1010  BP: 125/74 126/72 118/78 120/75  Pulse: (!) 57 60  74  Resp: 14 13    Temp: 97.8 F (36.6 C) 97.8 F (36.6 C) 97.9 F (36.6 C)   TempSrc: Oral Oral Oral   SpO2: (!) 87% 90% (!) 89%   Weight:      Height:        Wt Readings from Last 3 Encounters:    02/25/20 114.8 kg  02/14/20 (!) 116.6 kg  07/14/19 116.1 kg     Intake/Output Summary (Last 24 hours) at 02/27/2020 1134 Last data filed at 02/27/2020 0853 Gross per 24 hour  Intake 720 ml  Output 750 ml  Net -30 ml     Physical Exam Gen Exam:Alert awake-not in any distress HEENT:atraumatic, normocephalic Chest: B/L clear to auscultation anteriorly CVS:S1S2 regular Abdomen:soft non tender, non distended Extremities:no edema Neurology: Non focal Skin: no rash   Data Review:    CBC Recent Labs  Lab 02/20/20 2006 02/20/20 2230 02/21/20 0415 02/21/20 0532 02/24/20 0437  WBC 6.5 6.8 5.4  --  8.8  HGB 15.0 15.2 15.3 16.0 16.5  HCT 44.2 44.2 45.2 47.0 49.7  PLT 200 193 192  --  205  MCV 91.9 90.8 91.5  --  96.5  MCH 31.2 31.2 31.0  --  32.0  MCHC 33.9 34.4 33.8  --  33.2  RDW 12.7 12.7 12.9  --  13.0  LYMPHSABS 0.5*  --  0.3*  --  0.7  MONOABS 0.4  --  0.2  --  0.6  EOSABS 0.0  --  0.0  --  0.0  BASOSABS 0.0  --  0.0  --  0.0    Chemistries  Recent Labs  Lab 02/21/20 0415 02/21/20 0532 02/22/20 0500 02/23/20 0455 02/24/20 0437 02/25/20 0450 02/26/20 0656  NA 133*   < > 134* 138 137 135 131*  K 4.0   < > 4.0 4.2 4.6 5.6* 4.9  CL 92*   < > 95* 98 100 101 95*  CO2 30   < > 28 32 26 18* 27  GLUCOSE 139*   < > 142* 120* 96 78 99  BUN 12   < > 20 22 25* 23 20  CREATININE 0.79   < > 0.79 0.90 0.94 0.85 0.86  CALCIUM 7.8*   < > 8.0* 8.0* 8.0* 7.9* 8.1*  MG 2.5*  --   --   --   --   --   --   AST 83*  --  80* 61* 81* 64*  --   ALT 53*  --  67* 65* 95* QUANTITY NOT SUFFICIENT, UNABLE TO PERFORM TEST  --   ALKPHOS 124  --  126 136* 167* 164*  --   BILITOT 0.7  --  0.8 0.7 0.8 QUANTITY NOT SUFFICIENT, UNABLE TO PERFORM TEST  --    < > = values in this interval not displayed.   ------------------------------------------------------------------------------------------------------------------ No results for input(s): CHOL, HDL, LDLCALC, TRIG, CHOLHDL, LDLDIRECT in  the last 72 hours.  No results found for: HGBA1C ------------------------------------------------------------------------------------------------------------------ No results for input(s): TSH, T4TOTAL, T3FREE, THYROIDAB in the last 72 hours.  Invalid input(s): FREET3 ------------------------------------------------------------------------------------------------------------------ Recent Labs    02/25/20 0450 02/26/20 0656  FERRITIN 848* 837*    Coagulation profile No results for  input(s): INR, PROTIME in the last 168 hours.  Recent Labs    02/25/20 0450 02/26/20 0656  DDIMER >20.00* 19.30*    Cardiac Enzymes No results for input(s): CKMB, TROPONINI, MYOGLOBIN in the last 168 hours.  Invalid input(s): CK ------------------------------------------------------------------------------------------------------------------ No results found for: BNP  Micro Results Recent Results (from the past 240 hour(s))  Blood Culture (routine x 2)     Status: None   Collection Time: 02/20/20  8:06 PM   Specimen: BLOOD RIGHT HAND  Result Value Ref Range Status   Specimen Description BLOOD RIGHT HAND  Final   Special Requests   Final    BOTTLES DRAWN AEROBIC AND ANAEROBIC Blood Culture adequate volume   Culture   Final    NO GROWTH 5 DAYS Performed at [facility], [address], [place], [place] [address]    Report Status 02/25/2020 FINAL  Final  Blood Culture (routine x 2)     Status: None   Collection Time: 02/20/20  8:07 PM   Specimen: BLOOD LEFT HAND  Result Value Ref Range Status   Specimen Description BLOOD LEFT HAND  Final   Special Requests   Final    BOTTLES DRAWN AEROBIC AND ANAEROBIC Blood Culture results may not be optimal due to an inadequate volume of blood received in culture bottles   Culture   Final    NO GROWTH 5 DAYS Performed at [facility], [place] [address], [place], [place] [address]    Report Status 02/25/2020 FINAL  Final  MRSA PCR Screening      Status: None   Collection Time: 02/23/20  6:46 AM   Specimen: Nasal Mucosa; Nasopharyngeal  Result Value Ref Range Status   MRSA by PCR NEGATIVE NEGATIVE Final    Comment:        The GeneXpert MRSA Assay (FDA approved for NASAL specimens only), is one component of a comprehensive MRSA colonization surveillance program. It is not intended to diagnose MRSA infection nor to guide or monitor treatment for MRSA infections. Performed at [facility], [place] [address], [place], [place] [address]     Radiology Reports DG Chest [place] 1 View  Result Date: 02/20/2020 CLINICAL DATA:  Shortness of breath.  COVID positive 1 week ago. EXAM: PORTABLE CHEST 1 VIEW COMPARISON:  None. FINDINGS: Cardiomegaly with normal mediastinal contours. Moderate to advanced heterogeneous bilateral lung opacities in a mid lower lung zone predominant distribution. No evidence of pneumothorax and pneumomediastinum. No large pleural effusion. No acute osseous abnormalities are seen. IMPRESSION: Moderate to advanced bilateral heterogeneous lung opacities consistent with COVID pneumonia. Electronically Signed   By: Keith Rake M.D.   On: 02/20/2020 21:13   VAS [place] LOWER EXTREMITY VENOUS (DVT)  Result Date: 02/22/2020  Lower Venous DVTStudy Indications: Elevated d-dimer, and Edema.  Comparison Study: Prior study 09-23-2016 LEV RT Performing Technologist: Darlin Coco  Examination Guidelines: A complete evaluation includes B-mode imaging, spectral Doppler, color Doppler, and power Doppler as needed of all accessible portions of each vessel. Bilateral testing is considered an integral part of a complete examination. Limited examinations for reoccurring indications may be performed as noted. The reflux portion of the exam is performed with the patient in reverse Trendelenburg.  +---------+---------------+---------+-----------+----------+--------------+ RIGHT    CompressibilityPhasicitySpontaneityPropertiesThrombus  Aging +---------+---------------+---------+-----------+----------+--------------+ CFV      Full           Yes      Yes                                 +---------+---------------+---------+-----------+----------+--------------+  SFJ      Full                                                        +---------+---------------+---------+-----------+----------+--------------+ FV Prox  Full                                                        +---------+---------------+---------+-----------+----------+--------------+ FV Mid   Full                                                        +---------+---------------+---------+-----------+----------+--------------+ FV DistalFull                                                        +---------+---------------+---------+-----------+----------+--------------+ PFV      Full                                                        +---------+---------------+---------+-----------+----------+--------------+ POP      Full           Yes      Yes                                 +---------+---------------+---------+-----------+----------+--------------+ PTV      Full                                                        +---------+---------------+---------+-----------+----------+--------------+ PERO     Full                                                        +---------+---------------+---------+-----------+----------+--------------+   +---------+---------------+---------+-----------+----------+--------------+ LEFT     CompressibilityPhasicitySpontaneityPropertiesThrombus Aging +---------+---------------+---------+-----------+----------+--------------+ CFV      Full           Yes      Yes                                 +---------+---------------+---------+-----------+----------+--------------+ SFJ      Full                                                         +---------+---------------+---------+-----------+----------+--------------+  FV Prox  Full                                                        +---------+---------------+---------+-----------+----------+--------------+ FV Mid   Full                                                        +---------+---------------+---------+-----------+----------+--------------+ FV DistalFull                                                        +---------+---------------+---------+-----------+----------+--------------+ PFV      Full                                                        +---------+---------------+---------+-----------+----------+--------------+ POP      Full           Yes      Yes                                 +---------+---------------+---------+-----------+----------+--------------+ PTV      Partial                                                     +---------+---------------+---------+-----------+----------+--------------+ PERO     Partial                                                     +---------+---------------+---------+-----------+----------+--------------+     Summary: RIGHT: - There is no evidence of deep vein thrombosis in the lower extremity.  - No cystic structure found in the popliteal fossa.  LEFT: - Findings consistent with acute deep vein thrombosis involving the left posterior tibial veins, and left peroneal veins. - No cystic structure found in the popliteal fossa.  *See table(s) above for measurements and observations. Electronically signed by Deitra Mayo MD on 02/22/2020 at 8:08:47 PM.    Final

## 2020-02-28 ENCOUNTER — Inpatient Hospital Stay (HOSPITAL_COMMUNITY): Payer: BC Managed Care – PPO

## 2020-02-28 LAB — CBC
HCT: 47.6 % (ref 39.0–52.0)
Hemoglobin: 15.8 g/dL (ref 13.0–17.0)
MCH: 30.8 pg (ref 26.0–34.0)
MCHC: 33.2 g/dL (ref 30.0–36.0)
MCV: 92.8 fL (ref 80.0–100.0)
Platelets: 141 10*3/uL — ABNORMAL LOW (ref 150–400)
RBC: 5.13 MIL/uL (ref 4.22–5.81)
RDW: 12.9 % (ref 11.5–15.5)
WBC: 9.5 10*3/uL (ref 4.0–10.5)
nRBC: 0 % (ref 0.0–0.2)

## 2020-02-28 LAB — COMPREHENSIVE METABOLIC PANEL
ALT: 87 U/L — ABNORMAL HIGH (ref 0–44)
AST: 48 U/L — ABNORMAL HIGH (ref 15–41)
Albumin: 2.4 g/dL — ABNORMAL LOW (ref 3.5–5.0)
Alkaline Phosphatase: 128 U/L — ABNORMAL HIGH (ref 38–126)
Anion gap: 8 (ref 5–15)
BUN: 26 mg/dL — ABNORMAL HIGH (ref 8–23)
CO2: 28 mmol/L (ref 22–32)
Calcium: 8.4 mg/dL — ABNORMAL LOW (ref 8.9–10.3)
Chloride: 97 mmol/L — ABNORMAL LOW (ref 98–111)
Creatinine, Ser: 0.99 mg/dL (ref 0.61–1.24)
GFR calc Af Amer: >60 mL/min (ref >60)
GFR calc non Af Amer: >60 mL/min (ref >60)
Glucose, Bld: 112 mg/dL — ABNORMAL HIGH (ref 70–99)
Potassium: 5.3 mmol/L — ABNORMAL HIGH (ref 3.5–5.1)
Sodium: 133 mmol/L — ABNORMAL LOW (ref 135–145)
Total Bilirubin: 0.8 mg/dL (ref 0.3–1.2)
Total Protein: 4.9 g/dL — ABNORMAL LOW (ref 6.5–8.1)

## 2020-02-28 LAB — D-DIMER, QUANTITATIVE: D-Dimer, Quant: 15.06 ug/mL-FEU — ABNORMAL HIGH (ref 0.00–0.50)

## 2020-02-28 MED ORDER — SODIUM ZIRCONIUM CYCLOSILICATE 10 G PO PACK
10.0000 g | PACK | Freq: Every day | ORAL | Status: DC
  Administered 2020-02-28 – 2020-03-02 (×4): 10 g via ORAL
  Filled 2020-02-28 (×4): qty 1

## 2020-02-28 MED ORDER — OXYMETAZOLINE HCL 0.05 % NA SOLN
3.0000 | Freq: Two times a day (BID) | NASAL | Status: DC | PRN
  Administered 2020-02-28: 3 via NASAL
  Filled 2020-02-28: qty 30

## 2020-02-28 MED ORDER — FUROSEMIDE 10 MG/ML IJ SOLN
40.0000 mg | Freq: Once | INTRAMUSCULAR | Status: AC
  Administered 2020-02-28: 40 mg via INTRAVENOUS
  Filled 2020-02-28: qty 4

## 2020-02-28 MED ORDER — SALINE SPRAY 0.65 % NA SOLN
1.0000 | NASAL | Status: DC | PRN
  Administered 2020-02-28 (×2): 1 via NASAL
  Filled 2020-02-28: qty 44

## 2020-02-28 NOTE — Progress Notes (Signed)
Physical Therapy Treatment Patient Details Name: Ryan Turner MRN: [id number] DOB: 09/06/1950 Today's Date: 02/28/2020    History of Present Illness Pt is 69 yo male with past medical history for hypertension, diverticulosis, and irritable bowel syndrome who presents with worsening dyspnea.  Patient tested positive for COVID-19 on July 26.  His symptoms initially improved but then worsened and now admitted with resp failure due to COVID 19 viral PNE. Additionally with + R LE DVT on anticoagulation.    PT Comments    Pt motivated to improve mobility and to have exercises on his own.  Educated on AROM exercises in chair and bed - sats were stable with these activities.  Pt performed a few steps and marching in place on 10 L HHFNC with sats dropping as low as 83% (during initial recovery period) and taking 2 minutes to recover. This is slightly lower than last visit on the same amount of O2.  Continue to progress as able.   Follow Up Recommendations  Home health PT     Equipment Recommendations  Rolling walker with 5" wheels    Recommendations for Other Services       Precautions / Restrictions Precautions Precautions: Fall Precaution Comments: watch sats Restrictions Weight Bearing Restrictions: No    Mobility  Bed Mobility               General bed mobility comments: in chair  Transfers Overall transfer level: Needs assistance Equipment used: Rolling walker (2 wheeled) Transfers: Sit to/from Stand Sit to Stand: Supervision         General transfer comment: Supervision and assist with all lines; pt reports feels unsteady so utilized RW; sit to stand x 3 during session  Ambulation/Gait Ambulation/Gait assistance: Min guard Gait Distance (Feet): 30 Feet (30' and marched in place 1 minute x 2) Assistive device: Rolling walker (2 wheeled) Gait Pattern/deviations: Trunk flexed Gait velocity: decreased   General Gait Details: Pt ambulated 30' back and forth at  chair and then marched in place for 1 min x 2.  Took seated rest break between each activity.  Min guard for safety and cues for breathing technique. See comments for vitals   Stairs             Wheelchair Mobility    Modified Rankin (Stroke Patients Only)       Balance Overall balance assessment: Needs assistance   Sitting balance-Leahy Scale: Normal     Standing balance support: Bilateral upper extremity supported;No upper extremity supported Standing balance-Leahy Scale: Fair Standing balance comment: Felt better with RW but was stable statically without                            Cognition Arousal/Alertness: Awake/alert Behavior During Therapy: WFL for tasks assessed/performed Overall Cognitive Status: Within Functional Limits for tasks assessed                                 General Comments: motivated      Exercises General Exercises - Lower Extremity Ankle Circles/Pumps: AROM;10 reps;Both Long Arc Quad: AROM;Both;10 reps;Seated Heel Slides: AROM;Both;Other (comment);Seated Hip ABduction/ADduction: AROM;Both;10 reps;Seated Straight Leg Raises: AROM;Both;10 reps;Seated    General Comments General comments (skin integrity, edema, etc.):   Pt on 10 L HHFNC.  Sats were 92% rest, 88% activity but dropped to 83% initial recovery and taking 2 mins to recover to 90%.  Similar results for each standing bout of activity.  With exercises sats did not drop below 90%.    Pt eager to increase activity. Educated on AROM exercises that he could perform.  Also, pt reports buttock get sore in chair - discussed with pt and RN that he could stand statically at edge of chair or bed - discouraged independent transfers due to low sats and lines/leads      Pertinent Vitals/Pain Pain Assessment: No/denies pain    Home Living                      Prior Function            PT Goals (current goals can now be found in the care plan section)  Acute Rehab PT Goals Patient Stated Goal: recover and go home PT Goal Formulation: With patient Time For Goal Achievement: 03/10/20 Potential to Achieve Goals: Good Progress towards PT goals: Progressing toward goals    Frequency    Min 3X/week      PT Plan Current plan remains appropriate    Co-evaluation              AM-PAC PT "6 Clicks" Mobility   Outcome Measure  Help needed turning from your back to your side while in a flat bed without using bedrails?: None Help needed moving from lying on your back to sitting on the side of a flat bed without using bedrails?: None Help needed moving to and from a bed to a chair (including a wheelchair)?: None Help needed standing up from a chair using your arms (e.g., wheelchair or bedside chair)?: None Help needed to walk in hospital room?: A Little Help needed climbing 3-5 steps with a railing? : A Lot 6 Click Score: 21    End of Session Equipment Utilized During Treatment: Oxygen Activity Tolerance: Patient tolerated treatment well Patient left: with call bell/phone within reach;in chair Nurse Communication: Mobility status PT Visit Diagnosis: Other abnormalities of gait and mobility (R26.89)     Time: 8325-4982 PT Time Calculation (min) (ACUTE ONLY): 28 min  Charges:  $Gait Training: 8-22 mins $Therapeutic Exercise: 8-22 mins                     Abran Richard, PT Acute Rehab Services Pager [phone] [facility] [phone]     Karlton Lemon 02/28/2020, 5:04 PM

## 2020-02-28 NOTE — Progress Notes (Signed)
PROGRESS NOTE                                                                                                                                                                                                             Patient Demographics:    Ryan Turner, is a 69 y.o. male, DOB - 1950/11/22, QIH:[id number]  Outpatient Primary MD for the patient is [name], MD   Admit date - 02/20/2020   LOS - 8  Chief Complaint  Patient presents with  . Shortness of Breath       Brief Narrative: Patient is a 69 y.o. male with PMHx of HTN, IBS-who was diagnosed with COVID-19 on 7/26-presented to the ED on 8/1 with worsening shortness of breath-he was found to have acute hypoxic respiratory failure secondary to COVID-19 pneumonia.   Significant Events: 8/1>> Admit to [facility] for hypoxia from COVID-19 pneumonia.  Significant studies: 8/1>>Chest x-ray: Moderate to advanced bilateral heterogeneous lung opacities consistent with Covid PNA. 8/4>> left lower extremity Doppler: DVT involving posterior tibial and left peroneal vein. 8/9>> chest x-ray: Persistent bilateral interstitial/airspace densities in both lungs.  COVID-19 medications: Steroids: 8/1>> Remdesivir: 8/1>> 8/5 Actemra: 8/2 x 1  Antibiotics: None  Microbiology data: 8/1 >>blood culture: No growth  Procedures: None  Consults: None  DVT prophylaxis: SCDs Start: 02/20/20 2231 Lovenox at therapeutic dosing    Subjective:   Sitting at bedside chair-no major complaints-remains unchanged over the past few days.   Assessment  & Plan :   Acute Hypoxic Resp Failure due to Covid 19 Viral pneumonia: Continues to have severe hypoxemia requiring heated high flow-no significant change over the past few days.  Chest x-ray on 8/9 remains essentially the same as previous.  Has completed a course of remdesivir-remains on steroids-some trace lower extremity edema  today-we will give 1 dose of Lasix today.  Continue to monitor closely-continue attempts to slowly titrate down FiO2.   Fever: afebrile  O2 requirements:  SpO2: 90 % O2 Flow Rate (L/min): 15 L/min FiO2 (%): 100 %   COVID-19 Labs: Recent Labs    02/26/20 0656 02/27/20 1628 02/28/20 0218  DDIMER 19.30* 16.05* 15.06*  FERRITIN 837* 905*  --   CRP 0.7 1.1*  --     No results found for: BNP  No results for input(s): PROCALCITON in the last 168  hours.  Lab Results  Component Value Date   SARSCOV2NAA DETECTED (A) 02/14/2020     Prone/Incentive Spirometry: encouraged patient to lie prone for 3-4 hours at a time for a total of 16 hours a day, and to encourage incentive spirometry use 3-4/hour.  Left lower extremity DVT: Provoked by COVID-19-on full dose Lovenox-we will transition to Eliquis in the next few days.  Transaminitis: Secondary to COVID-19-follow closely.  Mild hyperkalemia: Start Lokelma-repeat electrolytes tomorrow.  HTN: BP stable-continue propanolol.  Mild hyponatremia: Stable for follow-up-no further work-up required.  Obesity: Estimated body mass index is 33.39 kg/m as calculated from the following:   Height as of this encounter: 6\' 1"  (1.854 m).   Weight as of this encounter: 114.8 kg.   GI prophylaxis: PPI  ABG:    Component Value Date/Time   PHART 7.480 (H) 02/21/2020 0532   PCO2ART 42.3 02/21/2020 0532   PO2ART 46 (L) 02/21/2020 0532   HCO3 31.4 (H) 02/21/2020 0532   TCO2 33 (H) 02/21/2020 0532   O2SAT 84.0 02/21/2020 0532    Vent Settings: FiO2 (%):  [100 %] 100 %  Condition - Extremely Guarded  Family Communication  :  Spouse ([email or web address]-[phone]) updated on 8/8-subsequently updated son-[name]  ([phone]) on 8/9  Code Status : DNR-RN at bedside when we discussed CODE STATUS this morning.  Spouse and Son updated about change in [place] on 8/7.  Diet :  Diet Order            Diet Heart Room service appropriate? Yes; Fluid  consistency: Thin  Diet effective now                  Disposition Plan  :   Status is: Inpatient  Remains inpatient appropriate because:Inpatient level of care appropriate due to severity of illness   Dispo: The patient is from: Home              Anticipated d/c is to: Home              Anticipated d/c date is: > 3 days              Patient currently is not medically stable to d/c.   Barriers to discharge: Severe hypoxemia secondary to COVID-19 on high flow oxygen.  Antimicorbials  :    Anti-infectives (From admission, onward)   Start     Dose/Rate Route Frequency Ordered Stop   02/21/20 1000  remdesivir 100 mg in sodium chloride 0.9 % 100 mL IVPB       "Followed by" Linked Group Details   100 mg 200 mL/hr over 30 Minutes Intravenous Daily 02/20/20 2006 02/24/20 0932   02/20/20 2015  remdesivir 200 mg in sodium chloride 0.9% 250 mL IVPB       "Followed by" Linked Group Details   200 mg 580 mL/hr over 30 Minutes Intravenous Once 02/20/20 2006 02/20/20 2151      Inpatient Medications  Scheduled Meds: . chlorpheniramine-HYDROcodone  5 mL Oral Q12H  . docusate sodium  200 mg Oral Daily  . enoxaparin (LOVENOX) injection  115 mg Subcutaneous Q12H  . Ipratropium-Albuterol  1 puff Inhalation Q6H  . mouth rinse  15 mL Mouth Rinse BID  . methylPREDNISolone (SOLU-MEDROL) injection  40 mg Intravenous Q12H  . multivitamin with minerals  1 tablet Oral Daily  . pantoprazole  40 mg Oral Q1200  . propranolol ER  80 mg Oral Daily  . sodium zirconium cyclosilicate  10 g Oral Daily  Continuous Infusions: PRN Meds:.acetaminophen, albuterol, fluticasone, guaiFENesin-dextromethorphan, oxymetazoline, polyethylene glycol, sodium chloride, zolpidem   Time Spent in minutes  25  See all Orders from today for further details   Oren Binet M.D on 02/28/2020 at 2:42 PM  To page go to [email or web address] - use universal password  [facility] -  Office  [phone]    Objective:    Vitals:   02/28/20 0000 02/28/20 0400 02/28/20 0840 02/28/20 1200  BP: 124/71 122/76 117/71 122/89  Pulse: 60 68 71 66  Resp: 16 14    Temp: 98.8 F (37.1 C) 98.9 F (37.2 C) 98.9 F (37.2 C) 98.1 F (36.7 C)  TempSrc: Oral Oral Oral Oral  SpO2: 93% 93% 92% 90%  Weight:      Height:        Wt Readings from Last 3 Encounters:  02/25/20 114.8 kg  02/14/20 (!) 116.6 kg  07/14/19 116.1 kg     Intake/Output Summary (Last 24 hours) at 02/28/2020 1442 Last data filed at 02/28/2020 1300 Gross per 24 hour  Intake 720 ml  Output 2475 ml  Net -1755 ml     Physical Exam Gen Exam:Alert awake-not in any distress HEENT:atraumatic, normocephalic Chest: B/L clear to auscultation anteriorly CVS:S1S2 regular Abdomen:soft non tender, non distended Extremities:trace edema Neurology: Non focal Skin: no rash   Data Review:    CBC Recent Labs  Lab 02/24/20 0437 02/27/20 1628 02/28/20 0218  WBC 8.8 9.1 9.5  HGB 16.5 16.5 15.8  HCT 49.7 48.5 47.6  PLT 205 145* 141*  MCV 96.5 93.4 92.8  MCH 32.0 31.8 30.8  MCHC 33.2 34.0 33.2  RDW 13.0 13.0 12.9  LYMPHSABS 0.7  --   --   MONOABS 0.6  --   --   EOSABS 0.0  --   --   BASOSABS 0.0  --   --     Chemistries  Recent Labs  Lab 02/23/20 0455 02/23/20 0455 02/24/20 0437 02/25/20 0450 02/26/20 0656 02/27/20 1628 02/28/20 0218  NA 138   < > 137 135 131* 134* 133*  K 4.2   < > 4.6 5.6* 4.9 4.8 5.3*  CL 98   < > 100 101 95* 97* 97*  CO2 32   < > 26 18* 27 25 28   GLUCOSE 120*   < > 96 78 99 120* 112*  BUN 22   < > 25* 23 20 23  26*  CREATININE 0.90   < > 0.94 0.85 0.86 0.90 0.99  CALCIUM 8.0*   < > 8.0* 7.9* 8.1* 8.6* 8.4*  AST 61*  --  81* 64*  --  50* 48*  ALT 65*  --  95* QUANTITY NOT SUFFICIENT, UNABLE TO PERFORM TEST  --  82* 87*  ALKPHOS 136*  --  167* 164*  --  136* 128*  BILITOT 0.7  --  0.8 QUANTITY NOT SUFFICIENT, UNABLE TO PERFORM TEST  --  0.6 0.8   < > = values in this interval not displayed.    ------------------------------------------------------------------------------------------------------------------ No results for input(s): CHOL, HDL, LDLCALC, TRIG, CHOLHDL, LDLDIRECT in the last 72 hours.  No results found for: HGBA1C ------------------------------------------------------------------------------------------------------------------ No results for input(s): TSH, T4TOTAL, T3FREE, THYROIDAB in the last 72 hours.  Invalid input(s): FREET3 ------------------------------------------------------------------------------------------------------------------ Recent Labs    02/26/20 0656 02/27/20 1628  FERRITIN 837* 905*    Coagulation profile No results for input(s): INR, PROTIME in the last 168 hours.  Recent Labs    02/27/20 1628 02/28/20 0218  DDIMER  16.05* 15.06*    Cardiac Enzymes No results for input(s): CKMB, TROPONINI, MYOGLOBIN in the last 168 hours.  Invalid input(s): CK ------------------------------------------------------------------------------------------------------------------ No results found for: BNP  Micro Results Recent Results (from the past 240 hour(s))  Blood Culture (routine x 2)     Status: None   Collection Time: 02/20/20  8:06 PM   Specimen: BLOOD RIGHT HAND  Result Value Ref Range Status   Specimen Description BLOOD RIGHT HAND  Final   Special Requests   Final    BOTTLES DRAWN AEROBIC AND ANAEROBIC Blood Culture adequate volume   Culture   Final    NO GROWTH 5 DAYS Performed at [facility], [address] [place], [place] [address]    Report Status 02/25/2020 FINAL  Final  Blood Culture (routine x 2)     Status: None   Collection Time: 02/20/20  8:07 PM   Specimen: BLOOD LEFT HAND  Result Value Ref Range Status   Specimen Description BLOOD LEFT HAND  Final   Special Requests   Final    BOTTLES DRAWN AEROBIC AND ANAEROBIC Blood Culture results may not be optimal due to an inadequate volume of blood received in culture  bottles   Culture   Final    NO GROWTH 5 DAYS Performed at [facility], [place] [address] [place], [place] [address]    Report Status 02/25/2020 FINAL  Final  MRSA PCR Screening     Status: None   Collection Time: 02/23/20  6:46 AM   Specimen: Nasal Mucosa; Nasopharyngeal  Result Value Ref Range Status   MRSA by PCR NEGATIVE NEGATIVE Final    Comment:        The GeneXpert MRSA Assay (FDA approved for NASAL specimens only), is one component of a comprehensive MRSA colonization surveillance program. It is not intended to diagnose MRSA infection nor to guide or monitor treatment for MRSA infections. Performed at [facility], [place] [address] [place], [place] [address]     Radiology Reports DG Chest [place] 1 View  Result Date: 02/28/2020 CLINICAL DATA:  Short of breath.  COVID positive 1 week ago. EXAM: PORTABLE CHEST 1 VIEW COMPARISON:  02/20/2020 FINDINGS: Diffuse bilateral interstitial and airspace densities are again identified. When compared with the previous exam the overall aeration of lungs is unchanged. No pneumothorax visualized. IMPRESSION: 1. Persistent bilateral interstitial and airspace densities within the lungs compatible with atypical viral pneumonia. 2. No change in aeration to the lungs. Electronically Signed   By: Kerby Moors M.D.   On: 02/28/2020 07:56   DG Chest [place] 1 View  Result Date: 02/20/2020 CLINICAL DATA:  Shortness of breath.  COVID positive 1 week ago. EXAM: PORTABLE CHEST 1 VIEW COMPARISON:  None. FINDINGS: Cardiomegaly with normal mediastinal contours. Moderate to advanced heterogeneous bilateral lung opacities in a mid lower lung zone predominant distribution. No evidence of pneumothorax and pneumomediastinum. No large pleural effusion. No acute osseous abnormalities are seen. IMPRESSION: Moderate to advanced bilateral heterogeneous lung opacities consistent with COVID pneumonia. Electronically Signed   By: Keith Rake M.D.   On:  02/20/2020 21:13   VAS [place] LOWER EXTREMITY VENOUS (DVT)  Result Date: 02/22/2020  Lower Venous DVTStudy Indications: Elevated d-dimer, and Edema.  Comparison Study: Prior study 09-23-2016 LEV RT Performing Technologist: Darlin Coco  Examination Guidelines: A complete evaluation includes B-mode imaging, spectral Doppler, color Doppler, and power Doppler as needed of all accessible portions of each vessel. Bilateral testing is considered an  integral part of a complete examination. Limited examinations for reoccurring indications may be performed as noted. The reflux portion of the exam is performed with the patient in reverse Trendelenburg.  +---------+---------------+---------+-----------+----------+--------------+ RIGHT    CompressibilityPhasicitySpontaneityPropertiesThrombus Aging +---------+---------------+---------+-----------+----------+--------------+ CFV      Full           Yes      Yes                                 +---------+---------------+---------+-----------+----------+--------------+ SFJ      Full                                                        +---------+---------------+---------+-----------+----------+--------------+ FV Prox  Full                                                        +---------+---------------+---------+-----------+----------+--------------+ FV Mid   Full                                                        +---------+---------------+---------+-----------+----------+--------------+ FV DistalFull                                                        +---------+---------------+---------+-----------+----------+--------------+ PFV      Full                                                        +---------+---------------+---------+-----------+----------+--------------+ POP      Full           Yes      Yes                                 +---------+---------------+---------+-----------+----------+--------------+ PTV       Full                                                        +---------+---------------+---------+-----------+----------+--------------+ PERO     Full                                                        +---------+---------------+---------+-----------+----------+--------------+   +---------+---------------+---------+-----------+----------+--------------+ LEFT     CompressibilityPhasicitySpontaneityPropertiesThrombus Aging +---------+---------------+---------+-----------+----------+--------------+ CFV  Full           Yes      Yes                                 +---------+---------------+---------+-----------+----------+--------------+ SFJ      Full                                                        +---------+---------------+---------+-----------+----------+--------------+ FV Prox  Full                                                        +---------+---------------+---------+-----------+----------+--------------+ FV Mid   Full                                                        +---------+---------------+---------+-----------+----------+--------------+ FV DistalFull                                                        +---------+---------------+---------+-----------+----------+--------------+ PFV      Full                                                        +---------+---------------+---------+-----------+----------+--------------+ POP      Full           Yes      Yes                                 +---------+---------------+---------+-----------+----------+--------------+ PTV      Partial                                                     +---------+---------------+---------+-----------+----------+--------------+ PERO     Partial                                                     +---------+---------------+---------+-----------+----------+--------------+     Summary: RIGHT: - There is no evidence of deep  vein thrombosis in the lower extremity.  - No cystic structure found in the popliteal fossa.  LEFT: - Findings consistent with acute deep vein thrombosis involving the left posterior tibial veins, and left peroneal veins. - No cystic structure found in the popliteal fossa.  *See table(s) above for measurements and observations. Electronically  signed by Deitra Mayo MD on 02/22/2020 at 8:08:47 PM.    Final

## 2020-02-29 LAB — COMPREHENSIVE METABOLIC PANEL
ALT: 72 U/L — ABNORMAL HIGH (ref 0–44)
AST: 33 U/L (ref 15–41)
Albumin: 2.6 g/dL — ABNORMAL LOW (ref 3.5–5.0)
Alkaline Phosphatase: 115 U/L (ref 38–126)
Anion gap: 11 (ref 5–15)
BUN: 25 mg/dL — ABNORMAL HIGH (ref 8–23)
CO2: 30 mmol/L (ref 22–32)
Calcium: 8.7 mg/dL — ABNORMAL LOW (ref 8.9–10.3)
Chloride: 93 mmol/L — ABNORMAL LOW (ref 98–111)
Creatinine, Ser: 0.86 mg/dL (ref 0.61–1.24)
GFR calc Af Amer: >60 mL/min (ref >60)
GFR calc non Af Amer: >60 mL/min (ref >60)
Glucose, Bld: 95 mg/dL (ref 70–99)
Potassium: 4.7 mmol/L (ref 3.5–5.1)
Sodium: 134 mmol/L — ABNORMAL LOW (ref 135–145)
Total Bilirubin: 1.1 mg/dL (ref 0.3–1.2)
Total Protein: 5.4 g/dL — ABNORMAL LOW (ref 6.5–8.1)

## 2020-02-29 LAB — CBC
HCT: 50.2 % (ref 39.0–52.0)
Hemoglobin: 16.8 g/dL (ref 13.0–17.0)
MCH: 31 pg (ref 26.0–34.0)
MCHC: 33.5 g/dL (ref 30.0–36.0)
MCV: 92.6 fL (ref 80.0–100.0)
Platelets: 133 10*3/uL — ABNORMAL LOW (ref 150–400)
RBC: 5.42 MIL/uL (ref 4.22–5.81)
RDW: 13 % (ref 11.5–15.5)
WBC: 8.2 10*3/uL (ref 4.0–10.5)
nRBC: 0 % (ref 0.0–0.2)

## 2020-02-29 LAB — D-DIMER, QUANTITATIVE: D-Dimer, Quant: 13.78 ug/mL-FEU — ABNORMAL HIGH (ref 0.00–0.50)

## 2020-02-29 LAB — C-REACTIVE PROTEIN: CRP: <0.5 mg/dL (ref <1.0)

## 2020-02-29 LAB — BRAIN NATRIURETIC PEPTIDE: B Natriuretic Peptide: 31.7 pg/mL (ref 0.0–100.0)

## 2020-02-29 LAB — FERRITIN: Ferritin: 1300 ng/mL — ABNORMAL HIGH (ref 24–336)

## 2020-02-29 MED ORDER — PREDNISONE 20 MG PO TABS
40.0000 mg | ORAL_TABLET | Freq: Every day | ORAL | Status: DC
  Administered 2020-03-01 – 2020-03-03 (×3): 40 mg via ORAL
  Filled 2020-02-29 (×3): qty 2

## 2020-02-29 MED ORDER — APIXABAN 5 MG PO TABS: 5.0000 mg | ORAL_TABLET | Freq: Two times a day (BID) | ORAL | Status: DC

## 2020-02-29 MED ORDER — APIXABAN 5 MG PO TABS
10.0000 mg | ORAL_TABLET | Freq: Two times a day (BID) | ORAL | Status: DC
  Administered 2020-03-01 – 2020-03-03 (×5): 10 mg via ORAL
  Filled 2020-02-29 (×5): qty 2

## 2020-02-29 NOTE — Progress Notes (Signed)
ANTICOAGULATION CONSULT NOTE - Initial Consult  Pharmacy Consult for apixaban Indication: DVT  Allergies  Allergen Reactions  . Morphine     Other reaction(s): VOMITING    Patient Measurements: Height: 6\' 1"  (185.4 cm) Weight: 114.8 kg (253 lb 1.4 oz) IBW/kg (Calculated) : 79.9  Vital Signs: Temp: 98.9 F (37.2 C) (08/10 1205) Temp Source: Oral (08/10 1205) BP: 110/73 (08/10 0324) Pulse Rate: 60 (08/10 0742)  Labs: Recent Labs    02/27/20 1628 02/27/20 1628 02/28/20 0218 02/29/20 0710  HGB 16.5   < > 15.8 16.8  HCT 48.5  --  47.6 50.2  PLT 145*  --  141* 133*  CREATININE 0.90  --  0.99 0.86   < > = values in this interval not displayed.    Estimated Creatinine Clearance: 104.6 mL/min (by C-G formula based on SCr of 0.86 mg/dL).   Medical History: Past Medical History:  Diagnosis Date  . Cataract 2014   removed  . DDD (degenerative disc disease), lumbar   . Diverticulosis   . Headache   . Hypertension   . IBS (irritable bowel syndrome)     Assessment: Pharmacy consulted to transition patient from treatment dose enoxaparin to apixaban for left lower extremity DVT.  Goal of Therapy:  DVT treatment Monitor platelets by anticoagulation protocol: Yes   Plan:  Discontinue enoxaparin after tonight's dose Tomorrow morning, begin apixaban 10 mg twice daily for 7 days followed by 5 mg twice daily Monitor CBC, s/s bleeding   Shaolin Armas P. Legrand Como, PharmD, [place] Please utilize Amion for appropriate phone number to reach the unit pharmacist ([place]) 02/29/2020 2:27 PM

## 2020-02-29 NOTE — Progress Notes (Signed)
Occupational Therapy Treatment Patient Details Name: Ryan Turner MRN: [id number] DOB: July 29, 1950 Today's Date: 02/29/2020    History of present illness Pt is 69 yo male with past medical history for hypertension, diverticulosis, and irritable bowel syndrome who presents with worsening dyspnea.  Patient tested positive for COVID-19 on July 26.  His symptoms initially improved but then worsened and now admitted with resp failure due to COVID 19 viral PNE. Additionally with + R LE DVT on anticoagulation.   OT comments  Making steady progress. Educated pt on pursed lip breathing and use of relaxation strategies during session. O2 probe moved ot R earlobe with good pleth and O2 sats @ 97 on 15 L. Pt able to ambulate 40 ft @ RW level with desat to 86 with 2/4 DOE. Continued educaiton on energy conservation strategies. Pt issued theraband to complete in room and encouraged to mobilize with nursing staff. Pt left on 12L HFNC with SpO2 97. Nsg made aware. Pt very appreciative. Will continue to follow acutely.   Follow Up Recommendations  No OT follow up;Supervision - Intermittent    Equipment Recommendations  3 in 1 bedside commode    Recommendations for Other Services      Precautions / Restrictions Precautions Precaution Comments: watch sats       Mobility Bed Mobility               General bed mobility comments: OOB in chair  Transfers Overall transfer level: Needs assistance   Transfers: Sit to/from Stand Sit to Stand: Supervision              Balance Overall balance assessment: Needs assistance   Sitting balance-Leahy Scale: Good       Standing balance-Leahy Scale: Fair                             ADL either performed or assessed with clinical judgement   ADL Overall ADL's : Needs assistance/impaired                                     Functional mobility during ADLs: Supervision/safety General ADL Comments: Completing bathing  with set up/supervision. Educated pt on need to complete most of bathing from seated position to conserve energy  - pt verbalized understanding.      Vision       Perception     Praxis      Cognition Arousal/Alertness: Awake/alert Behavior During Therapy: WFL for tasks assessed/performed Overall Cognitive Status: Within Functional Limits for tasks assessed                                          Exercises Exercises: Other exercises Other Exercises Other Exercises: incentive spirometer - able to pull @ 1100 Other Exercises: educated on theraband exercises adn seated chari exercises Other Exercises: encouraged standing exercises - will need to give standing HEP   Shoulder Instructions       General Comments      Pertinent Vitals/ Pain       Pain Assessment: No/denies pain  Home Living  Prior Functioning/Environment              Frequency  Min 3X/week        Progress Toward Goals  OT Goals(current goals can now be found in the care plan section)  Progress towards OT goals: Progressing toward goals  Acute Rehab OT Goals Patient Stated Goal: recover and go home OT Goal Formulation: With patient Time For Goal Achievement: 03/09/20 Potential to Achieve Goals: Good ADL Goals Pt Will Perform Grooming: Independently;standing Pt Will Transfer to Toilet: Independently;ambulating;regular height toilet Pt Will Perform Toileting - Clothing Manipulation and hygiene: Independently;sitting/lateral leans;sit to/from stand Additional ADL Goal #1: Patient will maintain SpO2 >90 during ADLs. Additional ADL Goal #2: Patient will implement energy conservation strategies independently as needed. Additional ADL Goal #3: Patient will increase activity tolerance to 5 min of standing ADLs  Plan Discharge plan remains appropriate    Co-evaluation                 AM-PAC OT "6 Clicks" Daily  Activity     Outcome Measure   Help from another person eating meals?: None Help from another person taking care of personal grooming?: A Little Help from another person toileting, which includes using toliet, bedpan, or urinal?: A Little Help from another person bathing (including washing, rinsing, drying)?: A Little Help from another person to put on and taking off regular upper body clothing?: A Little Help from another person to put on and taking off regular lower body clothing?: A Little 6 Click Score: 19    End of Session Equipment Utilized During Treatment: Rolling walker;Oxygen (15L)  OT Visit Diagnosis: Other (comment)   Activity Tolerance Patient tolerated treatment well   Patient Left in chair;with call bell/phone within reach   Nurse Communication Mobility status;Other (comment) (encourage ambulation. Pt left on 12L)        Time: 1411-1440 OT Time Calculation (min): 29 min  Charges: OT General Charges $OT Visit: 1 Visit OT Treatments $Therapeutic Activity: 23-37 mins  Maurie Boettcher, OT/L   Acute OT Clinical Specialist [place] Pager [phone] Office [phone]    [facility] 02/29/2020, 4:57 PM

## 2020-02-29 NOTE — Discharge Instructions (Addendum)
Date of Positive COVID Test: 02/14/20  Date Isolation Ends: 03/03/20    COVID-19 COVID-19 is a respiratory infection that is caused by a virus called severe acute respiratory syndrome coronavirus 2 (SARS-CoV-2). The disease is also known as coronavirus disease or novel coronavirus. In some people, the virus may not cause any symptoms. In others, it may cause a serious infection. The infection can get worse quickly and can lead to complications, such as:  Pneumonia, or infection of the lungs.  Acute respiratory distress syndrome or ARDS. This is a condition in which fluid build-up in the lungs prevents the lungs from filling with air and passing oxygen into the blood.  Acute respiratory failure. This is a condition in which there is not enough oxygen passing from the lungs to the body or when carbon dioxide is not passing from the lungs out of the body.  Sepsis or septic shock. This is a serious bodily reaction to an infection.  Blood clotting problems.  Secondary infections due to bacteria or fungus.  Organ failure. This is when your body's organs stop working. The virus that causes COVID-19 is contagious. This means that it can spread from person to person through droplets from coughs and sneezes (respiratory secretions). What are the causes? This illness is caused by a virus. You may catch the virus by:  Breathing in droplets from an infected person. Droplets can be spread by a person breathing, speaking, singing, coughing, or sneezing.  Touching something, like a table or a doorknob, that was exposed to the virus (contaminated) and then touching your mouth, nose, or eyes. What increases the risk? Risk for infection You are more likely to be infected with this virus if you:  Are within 6 feet (2 meters) of a person with COVID-19.  Provide care for or live with a person who is infected with COVID-19.  Spend time in crowded indoor spaces or live in shared housing. Risk for  serious illness You are more likely to become seriously ill from the virus if you:  Are 61 years of age or older. The higher your age, the more you are at risk for serious illness.  Live in a nursing home or long-term care facility.  Have cancer.  Have a long-term (chronic) disease such as: ? Chronic lung disease, including chronic obstructive pulmonary disease or asthma. ? A long-term disease that lowers your body's ability to fight infection (immunocompromised). ? Heart disease, including heart failure, a condition in which the arteries that lead to the heart become narrow or blocked (coronary artery disease), a disease which makes the heart muscle thick, weak, or stiff (cardiomyopathy). ? Diabetes. ? Chronic kidney disease. ? Sickle cell disease, a condition in which red blood cells have an abnormal "sickle" shape. ? Liver disease.  Are obese. What are the signs or symptoms? Symptoms of this condition can range from mild to severe. Symptoms may appear any time from 2 to 14 days after being exposed to the virus. They include:  A fever or chills.  A cough.  Difficulty breathing.  Headaches, body aches, or muscle aches.  Runny or stuffy (congested) nose.  A sore throat.  New loss of taste or smell. Some people may also have stomach problems, such as nausea, vomiting, or diarrhea. Other people may not have any symptoms of COVID-19. How is this diagnosed? This condition may be diagnosed based on:  Your signs and symptoms, especially if: ? You live in an area with a COVID-19 outbreak. ? You  recently traveled to or from an area where the virus is common. ? You provide care for or live with a person who was diagnosed with COVID-19. ? You were exposed to a person who was diagnosed with COVID-19.  A physical exam.  Lab tests, which may include: ? Taking a sample of fluid from the back of your nose and throat (nasopharyngeal fluid), your nose, or your throat using a  swab. ? A sample of mucus from your lungs (sputum). ? Blood tests.  Imaging tests, which may include, X-rays, CT scan, or ultrasound. How is this treated? At present, there is no medicine to treat COVID-19. Medicines that treat other diseases are being used on a trial basis to see if they are effective against COVID-19. Your health care provider will talk with you about ways to treat your symptoms. For most people, the infection is mild and can be managed at home with rest, fluids, and over-the-counter medicines. Treatment for a serious infection usually takes places in a hospital intensive care unit (ICU). It may include one or more of the following treatments. These treatments are given until your symptoms improve.  Receiving fluids and medicines through an IV.  Supplemental oxygen. Extra oxygen is given through a tube in the nose, a face mask, or a hood.  Positioning you to lie on your stomach (prone position). This makes it easier for oxygen to get into the lungs.  Continuous positive airway pressure (CPAP) or bi-level positive airway pressure (BPAP) machine. This treatment uses mild air pressure to keep the airways open. A tube that is connected to a motor delivers oxygen to the body.  Ventilator. This treatment moves air into and out of the lungs by using a tube that is placed in your windpipe.  Tracheostomy. This is a procedure to create a hole in the neck so that a breathing tube can be inserted.  Extracorporeal membrane oxygenation (ECMO). This procedure gives the lungs a chance to recover by taking over the functions of the heart and lungs. It supplies oxygen to the body and removes carbon dioxide. Follow these instructions at home: Lifestyle  If you are sick, stay home except to get medical care. Your health care provider will tell you how long to stay home. Call your health care provider before you go for medical care.  Rest at home as told by your health care provider.  Do  not use any products that contain nicotine or tobacco, such as cigarettes, e-cigarettes, and chewing tobacco. If you need help quitting, ask your health care provider.  Return to your normal activities as told by your health care provider. Ask your health care provider what activities are safe for you. General instructions  Take over-the-counter and prescription medicines only as told by your health care provider.  Drink enough fluid to keep your urine pale yellow.  Keep all follow-up visits as told by your health care provider. This is important. How is this prevented?  There is no vaccine to help prevent COVID-19 infection. However, there are steps you can take to protect yourself and others from this virus. To protect yourself:   Do not travel to areas where COVID-19 is a risk. The areas where COVID-19 is reported change often. To identify high-risk areas and travel restrictions, check the CDC travel website: FatFares.com.br  If you live in, or must travel to, an area where COVID-19 is a risk, take precautions to avoid infection. ? Stay away from people who are sick. ? Wash  your hands often with soap and water for 20 seconds. If soap and water are not available, use an alcohol-based hand sanitizer. ? Avoid touching your mouth, face, eyes, or nose. ? Avoid going out in public, follow guidance from your state and local health authorities. ? If you must go out in public, wear a cloth face covering or face mask. Make sure your mask covers your nose and mouth. ? Avoid crowded indoor spaces. Stay at least 6 feet (2 meters) away from others. ? Disinfect objects and surfaces that are frequently touched every day. This may include:  Counters and tables.  Doorknobs and light switches.  Sinks and faucets.  Electronics, such as phones, remote controls, keyboards, computers, and tablets. To protect others: If you have symptoms of COVID-19, take steps to prevent the virus from  spreading to others.  If you think you have a COVID-19 infection, contact your health care provider right away. Tell your health care team that you think you may have a COVID-19 infection.  Stay home. Leave your house only to seek medical care. Do not use public transport.  Do not travel while you are sick.  Wash your hands often with soap and water for 20 seconds. If soap and water are not available, use alcohol-based hand sanitizer.  Stay away from other members of your household. Let healthy household members care for children and pets, if possible. If you have to care for children or pets, wash your hands often and wear a mask. If possible, stay in your own room, separate from others. Use a different bathroom.  Make sure that all people in your household wash their hands well and often.  Cough or sneeze into a tissue or your sleeve or elbow. Do not cough or sneeze into your hand or into the air.  Wear a cloth face covering or face mask. Make sure your mask covers your nose and mouth. Where to find more information  Centers for Disease Control and Prevention: PurpleGadgets.be  World Health Organization: https://www.castaneda.info/ Contact a health care provider if:  You live in or have traveled to an area where COVID-19 is a risk and you have symptoms of the infection.  You have had contact with someone who has COVID-19 and you have symptoms of the infection. Get help right away if:  You have trouble breathing.  You have pain or pressure in your chest.  You have confusion.  You have bluish lips and fingernails.  You have difficulty waking from sleep.  You have symptoms that get worse. These symptoms may represent a serious problem that is an emergency. Do not wait to see if the symptoms will go away. Get medical help right away. Call your local emergency services (911 in the [place] Do not drive yourself to the hospital. Let the  emergency medical personnel know if you think you have COVID-19. Summary  COVID-19 is a respiratory infection that is caused by a virus. It is also known as coronavirus disease or novel coronavirus. It can cause serious infections, such as pneumonia, acute respiratory distress syndrome, acute respiratory failure, or sepsis.  The virus that causes COVID-19 is contagious. This means that it can spread from person to person through droplets from breathing, speaking, singing, coughing, or sneezing.  You are more likely to develop a serious illness if you are 25 years of age or older, have a weak immune system, live in a nursing home, or have chronic disease.  There is no medicine to treat COVID-19.  Your health care provider will talk with you about ways to treat your symptoms.  Take steps to protect yourself and others from infection. Wash your hands often and disinfect objects and surfaces that are frequently touched every day. Stay away from people who are sick and wear a mask if you are sick. This information is not intended to replace advice given to you by your health care provider. Make sure you discuss any questions you have with your health care provider. Document Revised: 05/07/2019 Document Reviewed: 08/13/2018 Elsevier Patient Education  [place] Can Do to Manage Your COVID-19 Symptoms at Home If you have possible or confirmed COVID-19: 1. Stay home from work and school. And stay away from other public places. If you must go out, avoid using any kind of public transportation, ridesharing, or taxis. 2. Monitor your symptoms carefully. If your symptoms get worse, call your healthcare provider immediately. 3. Get rest and stay hydrated. 4. If you have a medical appointment, call the healthcare provider ahead of time and tell them that you have or may have COVID-19. 5. For medical emergencies, call 911 and notify the dispatch personnel that you have or may have  COVID-19. 6. Cover your cough and sneezes with a tissue or use the inside of your elbow. 7. Wash your hands often with soap and water for at least 20 seconds or clean your hands with an alcohol-based hand sanitizer that contains at least 60% alcohol. 8. As much as possible, stay in a specific room and away from other people in your home. Also, you should use a separate bathroom, if available. If you need to be around other people in or outside of the home, wear a mask. 9. Avoid sharing personal items with other people in your household, like dishes, towels, and bedding. 10. Clean all surfaces that are touched often, like counters, tabletops, and doorknobs. Use household cleaning sprays or wipes according to the label instructions. [email or web address] 01/20/2019 This information is not intended to replace advice given to you by your health care provider. Make sure you discuss any questions you have with your health care provider. Document Revised: 06/24/2019 Document Reviewed: 06/24/2019 Elsevier Patient Education  [employer] [facility] - ELIQUIS (apixaban)  Why was Eliquis prescribed for you? Eliquis was prescribed to treat blood clots that may have been found in the veins of your legs (deep vein thrombosis) or in your lungs (pulmonary embolism) and to reduce the risk of them occurring again.  What do You need to know about Eliquis ? The starting dose is 10 mg (two 5 mg tablets) taken TWICE daily for the FIRST SEVEN (7) DAYS, then on 03/08/2020  the dose is reduced to ONE 5 mg tablet taken TWICE daily.  Eliquis may be taken with or without food.   Try to take the dose about the same time in the morning and in the evening. If you have difficulty swallowing the tablet whole please discuss with your pharmacist how to take the medication safely.  Take Eliquis exactly as prescribed and DO NOT stop taking Eliquis without talking to the doctor who prescribed the  medication.  Stopping may increase your risk of developing a new blood clot.  Refill your prescription before you run out.  After discharge, you should have regular check-up appointments with your healthcare provider that is prescribing your Eliquis.    What do you do if you miss a dose?  If a dose of ELIQUIS is not taken at the scheduled time, take it as soon as possible on the same day and twice-daily administration should be resumed. The dose should not be doubled to make up for a missed dose.  Important Safety Information A possible side effect of Eliquis is bleeding. You should call your healthcare provider right away if you experience any of the following: ? Bleeding from an injury or your nose that does not stop. ? Unusual colored urine (red or dark brown) or unusual colored stools (red or black). ? Unusual bruising for unknown reasons. ? A serious fall or if you hit your head (even if there is no bleeding).  Some medicines may interact with Eliquis and might increase your risk of bleeding or clotting while on Eliquis. To help avoid this, consult your healthcare provider or pharmacist prior to using any new prescription or non-prescription medications, including herbals, vitamins, non-steroidal anti-inflammatory drugs (NSAIDs) and supplements.  This website has more information on Eliquis (apixaban): http://www.eliquis.com/eliquis/home

## 2020-02-29 NOTE — Progress Notes (Addendum)
PROGRESS NOTE                                                                                                                                                                                                             Patient Demographics:    Ryan Turner, is a 69 y.o. male, DOB - Dec 21, 1950, GYI:[id number]  Outpatient Primary MD for the patient is Ryan Frizzle, MD   Admit date - 02/20/2020   LOS - 9  Chief Complaint  Patient presents with  . Shortness of Breath       Brief Narrative: Patient is a 69 y.o. male with PMHx of HTN, IBS-who was diagnosed with COVID-19 on 7/26-presented to the ED on 8/1 with worsening shortness of breath-he was found to have acute hypoxic respiratory failure secondary to COVID-19 pneumonia.  Hospital course complicated by severe persistent hypoxemia requiring heated high flow with no signs of improvement so far-and left lower extremity DVT.  Significant Events: 8/1>> Admit to [facility] for hypoxia from COVID-19 pneumonia.  Significant studies: 8/1>>Chest x-ray: Moderate to advanced bilateral heterogeneous lung opacities consistent with Covid PNA. 8/4>> left lower extremity Doppler: DVT involving posterior tibial and left peroneal vein. 8/9>> chest x-ray: Persistent bilateral interstitial/airspace densities in both lungs.  COVID-19 medications: Steroids: 8/1>> Remdesivir: 8/1>> 8/5 Actemra: 8/2 x 1  Antibiotics: None  Microbiology data: 8/1 >>blood culture: No growth  Procedures: None  Consults: None  DVT prophylaxis: SCDs Start: 02/20/20 2231 Lovenox at therapeutic dosing    Subjective:   Unchanged overnight-remains on heated high flow.  Comfortable-no major complaints.  In better spirits this morning-was talking to his son via FaceTime when I walked in.   Assessment  & Plan :   Acute Hypoxic Resp Failure due to Covid 19 Viral pneumonia: Continues to have severe  hypoxemia requiring heated high flow-no significant change over the past few days.  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  Chest x-ray on 8/9 remains essentially the same as previous.  Has completed a course of remdesivir-volume status is stable-no evidence of volume overload (8.9 L negative balance).  Start tapering steroids-to discontinue over the next few days. Continue to monitor closely-continue attempts to slowly titrate down FiO2.   Fever: afebrile  O2 requirements:  SpO2: 90 % O2 Flow Rate (L/min): 15 L/min FiO2 (%): 100 %  COVID-19 Labs: Recent Labs    02/27/20 1628 02/28/20 0218 02/29/20 0710  DDIMER 16.05* 15.06* 13.78*  FERRITIN 905*  --  1,300*  CRP 1.1*  --  <0.5       Component Value Date/Time   BNP 31.7 02/29/2020 0709    No results for input(s): PROCALCITON in the last 168 hours.  Lab Results  Component Value Date   SARSCOV2NAA DETECTED (A) 02/14/2020     Prone/Incentive Spirometry: encouraged patient to lie prone for 3-4 hours at a time for a total of 16 hours a day, and to encourage incentive spirometry use 3-4/hour.  Left lower extremity DVT: Provoked by COVID-19-on full dose Lovenox-we will transition to Eliquis  Thrombocytopenia: Mild-watch-follow closely.  Transaminitis: Secondary to COVID-19-follow closely.  Mild hyperkalemia: Improved-continue Lokelma for another day or so.  Repeat electrolytes tomorrow.  HTN: BP stable-continue propanolol.  Mild hyponatremia: Stable for follow-up-no further work-up required.  Obesity: Estimated body mass index is 33.39 kg/m as calculated from the following:   Height as of this encounter: 6\' 1"  (1.854 m).   Weight as of this encounter: 114.8 kg.   GI prophylaxis: PPI  ABG:    Component Value Date/Time   PHART 7.480 (H) 02/21/2020 0532   PCO2ART 42.3 02/21/2020 0532   PO2ART 46 (L) 02/21/2020 0532   HCO3 31.4 (H) 02/21/2020 0532   TCO2 33 (H) 02/21/2020 0532   O2SAT 84.0 02/21/2020  0532    Vent Settings: FiO2 (%):  [100 %] 100 %  Condition - Extremely Guarded  Family Communication  : Alternating daily between calling spouse and son.  Spouse [phone]) updated on 8/10. Son-Ryan Turner  [phone]) updated on 8/9-please update Ryan Turner on 8/11  Code Status : DNR.  Spouse and Son updated about change in [place] on 8/7.  Diet :  Diet Order            Diet Heart Room service appropriate? Yes; Fluid consistency: Thin  Diet effective now                  Disposition Plan  :   Status is: Inpatient  Remains inpatient appropriate because:Inpatient level of care appropriate due to severity of illness   Dispo: The patient is from: Home              Anticipated d/c is to: Home              Anticipated d/c date is: > 3 days              Patient currently is not medically stable to d/c.   Barriers to discharge: Severe hypoxemia secondary to COVID-19 on high flow oxygen.  Antimicorbials  :    Anti-infectives (From admission, onward)   Start     Dose/Rate Route Frequency Ordered Stop   02/21/20 1000  remdesivir 100 mg in sodium chloride 0.9 % 100 mL IVPB       "Followed by" Linked Group Details   100 mg 200 mL/hr over 30 Minutes Intravenous Daily 02/20/20 2006 02/24/20 0932   02/20/20 2015  remdesivir 200 mg in sodium chloride 0.9% 250 mL IVPB       "Followed by" Linked Group Details   200 mg 580 mL/hr over 30 Minutes Intravenous Once 02/20/20 2006 02/20/20 2151      Inpatient Medications  Scheduled Meds: . chlorpheniramine-HYDROcodone  5 mL Oral Q12H  . docusate sodium  200 mg Oral Daily  . enoxaparin (LOVENOX) injection  115 mg Subcutaneous  Q12H  . Ipratropium-Albuterol  1 puff Inhalation Q6H  . mouth rinse  15 mL Mouth Rinse BID  . methylPREDNISolone (SOLU-MEDROL) injection  40 mg Intravenous Q12H  . multivitamin with minerals  1 tablet Oral Daily  . pantoprazole  40 mg Oral Q1200  . propranolol ER  80 mg Oral Daily  . sodium  zirconium cyclosilicate  10 g Oral Daily   Continuous Infusions: PRN Meds:.acetaminophen, albuterol, fluticasone, guaiFENesin-dextromethorphan, oxymetazoline, polyethylene glycol, sodium chloride, zolpidem   Time Spent in minutes  25  See all Orders from today for further details   Oren Binet M.D on 02/29/2020 at 1:48 PM  To page go to [email or web address] - use universal password  [facility] -  Office  [phone]    Objective:   Vitals:   02/29/20 0742 02/29/20 0755 02/29/20 1057 02/29/20 1205  BP:      Pulse: 60     Resp: (!) 22   18  Temp:  98.2 F (36.8 C)  98.9 F (37.2 C)  TempSrc:  Oral  Oral  SpO2: 90%  90%   Weight:      Height:        Wt Readings from Last 3 Encounters:  02/25/20 114.8 kg  02/14/20 (!) 116.6 kg  07/14/19 116.1 kg     Intake/Output Summary (Last 24 hours) at 02/29/2020 1348 Last data filed at 02/29/2020 0845 Gross per 24 hour  Intake 720 ml  Output 2750 ml  Net -2030 ml     Physical Exam Gen Exam:Alert awake-not in any distress HEENT:atraumatic, normocephalic Chest: B/L clear to auscultation anteriorly CVS:S1S2 regular Abdomen:soft non tender, non distended Extremities:no edema Neurology: Non focal Skin: no rash   Data Review:    CBC Recent Labs  Lab 02/24/20 0437 02/27/20 1628 02/28/20 0218 02/29/20 0710  WBC 8.8 9.1 9.5 8.2  HGB 16.5 16.5 15.8 16.8  HCT 49.7 48.5 47.6 50.2  PLT 205 145* 141* 133*  MCV 96.5 93.4 92.8 92.6  MCH 32.0 31.8 30.8 31.0  MCHC 33.2 34.0 33.2 33.5  RDW 13.0 13.0 12.9 13.0  LYMPHSABS 0.7  --   --   --   MONOABS 0.6  --   --   --   EOSABS 0.0  --   --   --   BASOSABS 0.0  --   --   --     Chemistries  Recent Labs  Lab 02/24/20 0437 02/24/20 0437 02/25/20 0450 02/26/20 0656 02/27/20 1628 02/28/20 0218 02/29/20 0710  NA 137   < > 135 131* 134* 133* 134*  K 4.6   < > 5.6* 4.9 4.8 5.3* 4.7  CL 100   < > 101 95* 97* 97* 93*  CO2 26   < > 18* 27 25 28 30   GLUCOSE 96   < >  78 99 120* 112* 95  BUN 25*   < > 23 20 23  26* 25*  CREATININE 0.94   < > 0.85 0.86 0.90 0.99 0.86  CALCIUM 8.0*   < > 7.9* 8.1* 8.6* 8.4* 8.7*  AST 81*  --  64*  --  50* 48* 33  ALT 95*  --  QUANTITY NOT SUFFICIENT, UNABLE TO PERFORM TEST  --  82* 87* 72*  ALKPHOS 167*  --  164*  --  136* 128* 115  BILITOT 0.8  --  QUANTITY NOT SUFFICIENT, UNABLE TO PERFORM TEST  --  0.6 0.8 1.1   < > = values in this interval not  displayed.   ------------------------------------------------------------------------------------------------------------------ No results for input(s): CHOL, HDL, LDLCALC, TRIG, CHOLHDL, LDLDIRECT in the last 72 hours.  No results found for: HGBA1C ------------------------------------------------------------------------------------------------------------------ No results for input(s): TSH, T4TOTAL, T3FREE, THYROIDAB in the last 72 hours.  Invalid input(s): FREET3 ------------------------------------------------------------------------------------------------------------------ Recent Labs    02/27/20 1628 02/29/20 0710  FERRITIN 905* 1,300*    Coagulation profile No results for input(s): INR, PROTIME in the last 168 hours.  Recent Labs    02/28/20 0218 02/29/20 0710  DDIMER 15.06* 13.78*    Cardiac Enzymes No results for input(s): CKMB, TROPONINI, MYOGLOBIN in the last 168 hours.  Invalid input(s): CK ------------------------------------------------------------------------------------------------------------------    Component Value Date/Time   BNP 31.7 02/29/2020 0709    Micro Results Recent Results (from the past 240 hour(s))  Blood Culture (routine x 2)     Status: None   Collection Time: 02/20/20  8:06 PM   Specimen: BLOOD RIGHT HAND  Result Value Ref Range Status   Specimen Description BLOOD RIGHT HAND  Final   Special Requests   Final    BOTTLES DRAWN AEROBIC AND ANAEROBIC Blood Culture adequate volume   Culture   Final    NO GROWTH 5  DAYS Performed at [facility], [address] [place], [place] [address]    Report Status 02/25/2020 FINAL  Final  Blood Culture (routine x 2)     Status: None   Collection Time: 02/20/20  8:07 PM   Specimen: BLOOD LEFT HAND  Result Value Ref Range Status   Specimen Description BLOOD LEFT HAND  Final   Special Requests   Final    BOTTLES DRAWN AEROBIC AND ANAEROBIC Blood Culture results may not be optimal due to an inadequate volume of blood received in culture bottles   Culture   Final    NO GROWTH 5 DAYS Performed at [facility], [place] [address] [place], [place] [address]    Report Status 02/25/2020 FINAL  Final  MRSA PCR Screening     Status: None   Collection Time: 02/23/20  6:46 AM   Specimen: Nasal Mucosa; Nasopharyngeal  Result Value Ref Range Status   MRSA by PCR NEGATIVE NEGATIVE Final    Comment:        The GeneXpert MRSA Assay (FDA approved for NASAL specimens only), is one component of a comprehensive MRSA colonization surveillance program. It is not intended to diagnose MRSA infection nor to guide or monitor treatment for MRSA infections. Performed at [facility], [place] [address] [place],  [address]     Radiology Reports DG Chest [place] 1 View  Result Date: 02/28/2020 CLINICAL DATA:  Short of breath.  COVID positive 1 week ago. EXAM: PORTABLE CHEST 1 VIEW COMPARISON:  02/20/2020 FINDINGS: Diffuse bilateral interstitial and airspace densities are again identified. When compared with the previous exam the overall aeration of lungs is unchanged. No pneumothorax visualized. IMPRESSION: 1. Persistent bilateral interstitial and airspace densities within the lungs compatible with atypical viral pneumonia. 2. No change in aeration to the lungs. Electronically Signed   By: Kerby Moors M.D.   On: 02/28/2020 07:56   DG Chest [place] 1 View  Result Date: 02/20/2020 CLINICAL DATA:  Shortness of breath.  COVID positive 1 week ago. EXAM: PORTABLE CHEST  1 VIEW COMPARISON:  None. FINDINGS: Cardiomegaly with normal mediastinal contours. Moderate to advanced heterogeneous bilateral lung opacities in a mid lower lung zone predominant distribution. No evidence of pneumothorax and pneumomediastinum. No large pleural effusion. No  acute osseous abnormalities are seen. IMPRESSION: Moderate to advanced bilateral heterogeneous lung opacities consistent with COVID pneumonia. Electronically Signed   By: Keith Rake M.D.   On: 02/20/2020 21:13   VAS [place] LOWER EXTREMITY VENOUS (DVT)  Result Date: 02/22/2020  Lower Venous DVTStudy Indications: Elevated d-dimer, and Edema.  Comparison Study: Prior study 09-23-2016 LEV RT Performing Technologist: Darlin Coco  Examination Guidelines: A complete evaluation includes B-mode imaging, spectral Doppler, color Doppler, and power Doppler as needed of all accessible portions of each vessel. Bilateral testing is considered an integral part of a complete examination. Limited examinations for reoccurring indications may be performed as noted. The reflux portion of the exam is performed with the patient in reverse Trendelenburg.  +---------+---------------+---------+-----------+----------+--------------+ RIGHT    CompressibilityPhasicitySpontaneityPropertiesThrombus Aging +---------+---------------+---------+-----------+----------+--------------+ CFV      Full           Yes      Yes                                 +---------+---------------+---------+-----------+----------+--------------+ SFJ      Full                                                        +---------+---------------+---------+-----------+----------+--------------+ FV Prox  Full                                                        +---------+---------------+---------+-----------+----------+--------------+ FV Mid   Full                                                         +---------+---------------+---------+-----------+----------+--------------+ FV DistalFull                                                        +---------+---------------+---------+-----------+----------+--------------+ PFV      Full                                                        +---------+---------------+---------+-----------+----------+--------------+ POP      Full           Yes      Yes                                 +---------+---------------+---------+-----------+----------+--------------+ PTV      Full                                                        +---------+---------------+---------+-----------+----------+--------------+  PERO     Full                                                        +---------+---------------+---------+-----------+----------+--------------+   +---------+---------------+---------+-----------+----------+--------------+ LEFT     CompressibilityPhasicitySpontaneityPropertiesThrombus Aging +---------+---------------+---------+-----------+----------+--------------+ CFV      Full           Yes      Yes                                 +---------+---------------+---------+-----------+----------+--------------+ SFJ      Full                                                        +---------+---------------+---------+-----------+----------+--------------+ FV Prox  Full                                                        +---------+---------------+---------+-----------+----------+--------------+ FV Mid   Full                                                        +---------+---------------+---------+-----------+----------+--------------+ FV DistalFull                                                        +---------+---------------+---------+-----------+----------+--------------+ PFV      Full                                                         +---------+---------------+---------+-----------+----------+--------------+ POP      Full           Yes      Yes                                 +---------+---------------+---------+-----------+----------+--------------+ PTV      Partial                                                     +---------+---------------+---------+-----------+----------+--------------+ PERO     Partial                                                     +---------+---------------+---------+-----------+----------+--------------+  Summary: RIGHT: - There is no evidence of deep vein thrombosis in the lower extremity.  - No cystic structure found in the popliteal fossa.  LEFT: - Findings consistent with acute deep vein thrombosis involving the left posterior tibial veins, and left peroneal veins. - No cystic structure found in the popliteal fossa.  *See table(s) above for measurements and observations. Electronically signed by Deitra Mayo MD on 02/22/2020 at 8:08:47 PM.    Final

## 2020-03-01 LAB — CBC
HCT: 49.3 % (ref 39.0–52.0)
Hemoglobin: 16.3 g/dL (ref 13.0–17.0)
MCH: 30.8 pg (ref 26.0–34.0)
MCHC: 33.1 g/dL (ref 30.0–36.0)
MCV: 93.2 fL (ref 80.0–100.0)
Platelets: 121 10*3/uL — ABNORMAL LOW (ref 150–400)
RBC: 5.29 MIL/uL (ref 4.22–5.81)
RDW: 12.8 % (ref 11.5–15.5)
WBC: 8.9 10*3/uL (ref 4.0–10.5)
nRBC: 0 % (ref 0.0–0.2)

## 2020-03-01 LAB — COMPREHENSIVE METABOLIC PANEL
ALT: 60 U/L — ABNORMAL HIGH (ref 0–44)
AST: 29 U/L (ref 15–41)
Albumin: 2.4 g/dL — ABNORMAL LOW (ref 3.5–5.0)
Alkaline Phosphatase: 105 U/L (ref 38–126)
Anion gap: 9 (ref 5–15)
BUN: 31 mg/dL — ABNORMAL HIGH (ref 8–23)
CO2: 30 mmol/L (ref 22–32)
Calcium: 8.6 mg/dL — ABNORMAL LOW (ref 8.9–10.3)
Chloride: 94 mmol/L — ABNORMAL LOW (ref 98–111)
Creatinine, Ser: 0.98 mg/dL (ref 0.61–1.24)
GFR calc Af Amer: >60 mL/min (ref >60)
GFR calc non Af Amer: >60 mL/min (ref >60)
Glucose, Bld: 108 mg/dL — ABNORMAL HIGH (ref 70–99)
Potassium: 4.3 mmol/L (ref 3.5–5.1)
Sodium: 133 mmol/L — ABNORMAL LOW (ref 135–145)
Total Bilirubin: 0.7 mg/dL (ref 0.3–1.2)
Total Protein: 4.8 g/dL — ABNORMAL LOW (ref 6.5–8.1)

## 2020-03-01 LAB — D-DIMER, QUANTITATIVE: D-Dimer, Quant: 11.87 ug/mL-FEU — ABNORMAL HIGH (ref 0.00–0.50)

## 2020-03-01 LAB — C-REACTIVE PROTEIN: CRP: 0.8 mg/dL (ref <1.0)

## 2020-03-01 LAB — FERRITIN: Ferritin: 1121 ng/mL — ABNORMAL HIGH (ref 24–336)

## 2020-03-01 NOTE — Progress Notes (Signed)
Ryan Turner  ERX:[id number] DOB: 05/12/1951 DOA: 02/20/2020 PCP: [name], MD    Brief Narrative:  69 year old with a history of HTN and IBS who was diagnosed with Covid 7/26 and presented to the ED 8/1 with worsening shortness of breath.  He was found to be acutely hypoxic with Covid pneumonia.  His hospital course has been complicated by severe persistent hypoxia requiring ongoing heated high flow nasal cannula support.  He has also been confirmed to have a left lower extremity DVT.  Significant Events:  8/1 admit via [place]  Date of Positive COVID Test:  7/26  Vaccination Status: Unvaccinated  COVID-19 specific Treatment: Steroid 8/1 > Remdesivir 8/1 > 8/5 Actemra 8/2  Antimicrobials:  None  DVT prophylaxis: Lovenox  Subjective: Making progress with weaning of oxygen.  Continue to work with ambulation and mobility.  Denies chest pain nausea vomiting abdominal pain.  Reports that appetite is improving.  Assessment & Plan:  COVID Pneumonia -acute hypoxic respiratory failure Continues to require significant oxygen support-suspect now in fibrotic stage of ARDS -serial chest x-rays without change -completed course of remdesivir -no evidence of volume overload with significant negative balance -steroids being tapered -titrate oxygen as able  Recent Labs  Lab 02/25/20 0450 02/25/20 0450 02/26/20 0656 02/27/20 1628 02/28/20 0218 02/29/20 0710 03/01/20 0147  DDIMER >20.00*   < > 19.30* 16.05* 15.06* 13.78* 11.87*  FERRITIN 848*  --  837* 905*  --  1,300* 1,121*  CRP 1.0*  --  0.7 1.1*  --  <0.5 0.8  ALT QUANTITY NOT SUFFICIENT, UNABLE TO PERFORM TEST  --   --  82* 87* 72* 60*   < > = values in this interval not displayed.    Left lower extremity DVT Felt to be provoked by Covid - Lovenox transitioned to Eliquis  Thrombocytopenia Mild and stable for now  Transaminitis Due to Covid and treatment of same -stable  Mild hyperkalemia Continue Lokelma  -improving  HTN  Mild hyponatremia  Obesity - Body mass index is 33.39 kg/m.    Code Status: NO CODE BLUE Family Communication:  Status is: Inpatient  Remains inpatient appropriate because:Inpatient level of care appropriate due to severity of illness   Dispo: The patient is from: Home              Anticipated d/c is to: Home              Anticipated d/c date is: 3 days              Patient currently is not medically stable to d/c.  Consultants:  none  Objective: Blood pressure 103/72, pulse 68, temperature 98.3 F (36.8 C), temperature source Oral, resp. rate 19, height 6\' 1"  (1.854 m), weight 114.8 kg, SpO2 90 %.  Intake/Output Summary (Last 24 hours) at 03/01/2020 1045 Last data filed at 03/01/2020 0746 Gross per 24 hour  Intake 480 ml  Output 800 ml  Net -320 ml   Filed Weights   02/21/20 1700 02/25/20 0400  Weight: 115.7 kg 114.8 kg    Examination: General: No acute respiratory distress Lungs: Diffuse fine crackles bilaterally with no wheezing Cardiovascular: Regular rate and rhythm without murmur gallop or rub normal S1 and S2 Abdomen: Nontender, nondistended, soft, bowel sounds positive, no rebound, no ascites, no appreciable mass Extremities: No significant cyanosis, clubbing, or edema bilateral lower extremities  CBC: Recent Labs  Lab 02/24/20 0437 02/27/20 1628 02/28/20 0218 02/29/20 0710 03/01/20 0147  WBC 8.8   < >  9.5 8.2 8.9  NEUTROABS 7.3  --   --   --   --   HGB 16.5   < > 15.8 16.8 16.3  HCT 49.7   < > 47.6 50.2 49.3  MCV 96.5   < > 92.8 92.6 93.2  PLT 205   < > 141* 133* 121*   < > = values in this interval not displayed.   Basic Metabolic Panel: Recent Labs  Lab 02/28/20 0218 02/29/20 0710 03/01/20 0147  NA 133* 134* 133*  K 5.3* 4.7 4.3  CL 97* 93* 94*  CO2 28 30 30   GLUCOSE 112* 95 108*  BUN 26* 25* 31*  CREATININE 0.99 0.86 0.98  CALCIUM 8.4* 8.7* 8.6*   GFR: Estimated Creatinine Clearance: 91.8 mL/min (by C-G  formula based on SCr of 0.98 mg/dL).  Liver Function Tests: Recent Labs  Lab 02/27/20 1628 02/28/20 0218 02/29/20 0710 03/01/20 0147  AST 50* 48* 33 29  ALT 82* 87* 72* 60*  ALKPHOS 136* 128* 115 105  BILITOT 0.6 0.8 1.1 0.7  PROT 5.0* 4.9* 5.4* 4.8*  ALBUMIN 2.4* 2.4* 2.6* 2.4*     Recent Results (from the past 240 hour(s))  Blood Culture (routine x 2)     Status: None   Collection Time: 02/20/20  8:06 PM   Specimen: BLOOD RIGHT HAND  Result Value Ref Range Status   Specimen Description BLOOD RIGHT HAND  Final   Special Requests   Final    BOTTLES DRAWN AEROBIC AND ANAEROBIC Blood Culture adequate volume   Culture   Final    NO GROWTH 5 DAYS Performed at [facility], [address], [place], [place] [address]    Report Status 02/25/2020 FINAL  Final  Blood Culture (routine x 2)     Status: None   Collection Time: 02/20/20  8:07 PM   Specimen: BLOOD LEFT HAND  Result Value Ref Range Status   Specimen Description BLOOD LEFT HAND  Final   Special Requests   Final    BOTTLES DRAWN AEROBIC AND ANAEROBIC Blood Culture results may not be optimal due to an inadequate volume of blood received in culture bottles   Culture   Final    NO GROWTH 5 DAYS Performed at [facility], [place] [address], [place], [place] [address]    Report Status 02/25/2020 FINAL  Final  MRSA PCR Screening     Status: None   Collection Time: 02/23/20  6:46 AM   Specimen: Nasal Mucosa; Nasopharyngeal  Result Value Ref Range Status   MRSA by PCR NEGATIVE NEGATIVE Final    Comment:        The GeneXpert MRSA Assay (FDA approved for NASAL specimens only), is one component of a comprehensive MRSA colonization surveillance program. It is not intended to diagnose MRSA infection nor to guide or monitor treatment for MRSA infections. Performed at [facility], [place] [address] [place], [place] [address]      Scheduled Meds:  apixaban  10 mg Oral BID   Followed by   Derrill Memo ON  03/08/2020] apixaban  5 mg Oral BID   chlorpheniramine-HYDROcodone  5 mL Oral Q12H   docusate sodium  200 mg Oral Daily   Ipratropium-Albuterol  1 puff Inhalation Q6H   mouth rinse  15 mL Mouth Rinse BID   multivitamin with minerals  1 tablet Oral Daily   pantoprazole  40 mg Oral Q1200   predniSONE  40 mg Oral Q breakfast  propranolol ER  80 mg Oral Daily   sodium zirconium cyclosilicate  10 g Oral Daily     LOS: 10 days   Cherene Altes, MD [facility] Office  [phone] Pager - Text Page per Amion  If 7PM-7AM, please contact night-coverage per Amion 03/01/2020, 10:45 AM

## 2020-03-01 NOTE — Progress Notes (Signed)
Physical Therapy Treatment Patient Details Name: Ryan Turner MRN: [id number] DOB: 02/24/1951 Today's Date: 03/01/2020    History of Present Illness Pt is 69 yo male with past medical history for hypertension, diverticulosis, and irritable bowel syndrome who presents with worsening dyspnea.  Patient tested positive for COVID-19 on July 26.  His symptoms initially improved but then worsened and now admitted 02/20/20 with resp failure due to COVID 19 viral PNA. Additionally with + R LE DVT on anticoagulation.   PT Comments    Pt progressing well with mobility. Today's session focused on gait training and standing activity, pt's stability and energy conservation improved with RW; reviewed seated therex. SpO2 down to 83% on 6L O2 Ryan Turner, ultimately returning to 94% on 5L with prolonged seated rest and deep breathing. BP down to 88/78, difficulty getting additional read with cuff; HR 65. Pt motivated to participate and regain PLOF.   Follow Up Recommendations  Home health PT     Equipment Recommendations  Rolling walker with 5" wheels (pt to check if he owns one)    Recommendations for Other Services       Precautions / Restrictions Precautions Precautions: Fall;Other (comment) Precaution Comments: Watch SpO2 and BP Restrictions Weight Bearing Restrictions: No    Mobility  Bed Mobility               General bed mobility comments: OOB in chair  Transfers Overall transfer level: Needs assistance Equipment used: None;Rolling walker (2 wheeled) Transfers: Sit to/from Stand Sit to Stand: Supervision            Ambulation/Gait Ambulation/Gait assistance: Min guard Gait Distance (Feet): 120 Feet Assistive device: None;1 person hand held assist;Rolling walker (2 wheeled) Gait Pattern/deviations: Step-through pattern;Decreased stride length;Drifts right/left Gait velocity: Decreased   General Gait Details: Initial unsteadiness ambulating 30' to/from bathroom without DME, pt  reaching to furniture or HHA for balance, c/o dizziness with BP 88/78 after voiding standing at toilet. Additional gait trial with RW 90', stability improved   Stairs             Wheelchair Mobility    Modified Rankin (Stroke Patients Only)       Balance Overall balance assessment: Needs assistance   Sitting balance-Leahy Scale: Good       Standing balance-Leahy Scale: Fair Standing balance comment: Stability improved with RW                            Cognition Arousal/Alertness: Awake/alert Behavior During Therapy: WFL for tasks assessed/performed Overall Cognitive Status: Within Functional Limits for tasks assessed                                        Exercises      General Comments General comments (skin integrity, edema, etc.): "Standing at that toilet to pee was the highlight of my day." SpO2 down to 83% on 6L O2 with ambulation; returning to 94% on 5L O2 Ryan Turner with seated rest      Pertinent Vitals/Pain Pain Assessment: Faces Faces Pain Scale: Hurts a little bit Pain Location: L side chest "pulled muscle" (coughing) Pain Descriptors / Indicators: Guarding Pain Intervention(s): Monitored during session    Home Living                      Prior Function  PT Goals (current goals can now be found in the care plan section) Progress towards PT goals: Progressing toward goals    Frequency    Min 3X/week      PT Plan Current plan remains appropriate    Co-evaluation              AM-PAC PT "6 Clicks" Mobility   Outcome Measure  Help needed turning from your back to your side while in a flat bed without using bedrails?: None Help needed moving from lying on your back to sitting on the side of a flat bed without using bedrails?: None Help needed moving to and from a bed to a chair (including a wheelchair)?: None Help needed standing up from a chair using your arms (e.g., wheelchair or bedside  chair)?: None Help needed to walk in hospital room?: A Little Help needed climbing 3-5 steps with a railing? : A Little 6 Click Score: 22    End of Session Equipment Utilized During Treatment: Oxygen Activity Tolerance: Patient tolerated treatment well Patient left: in chair;with call bell/phone within reach Nurse Communication: Mobility status PT Visit Diagnosis: Other abnormalities of gait and mobility (R26.89)     Time: 9470-9628 PT Time Calculation (min) (ACUTE ONLY): 27 min  Charges:  $Gait Training: 8-22 mins $Therapeutic Exercise: 8-22 mins                     Ryan Turner, PT, DPT Acute Rehabilitation Services  Pager [phone] Office [phone]  Ryan Turner 03/01/2020, 4:53 PM

## 2020-03-02 LAB — CBC
HCT: 46.1 % (ref 39.0–52.0)
Hemoglobin: 15.7 g/dL (ref 13.0–17.0)
MCH: 31.8 pg (ref 26.0–34.0)
MCHC: 34.1 g/dL (ref 30.0–36.0)
MCV: 93.5 fL (ref 80.0–100.0)
Platelets: 107 10*3/uL — ABNORMAL LOW (ref 150–400)
RBC: 4.93 MIL/uL (ref 4.22–5.81)
RDW: 12.9 % (ref 11.5–15.5)
WBC: 9.7 10*3/uL (ref 4.0–10.5)
nRBC: 0 % (ref 0.0–0.2)

## 2020-03-02 LAB — COMPREHENSIVE METABOLIC PANEL
ALT: 42 U/L (ref 0–44)
AST: 20 U/L (ref 15–41)
Albumin: 2.3 g/dL — ABNORMAL LOW (ref 3.5–5.0)
Alkaline Phosphatase: 77 U/L (ref 38–126)
Anion gap: 8 (ref 5–15)
BUN: 15 mg/dL (ref 8–23)
CO2: 30 mmol/L (ref 22–32)
Calcium: 8 mg/dL — ABNORMAL LOW (ref 8.9–10.3)
Chloride: 94 mmol/L — ABNORMAL LOW (ref 98–111)
Creatinine, Ser: 0.87 mg/dL (ref 0.61–1.24)
GFR calc Af Amer: >60 mL/min (ref >60)
GFR calc non Af Amer: >60 mL/min (ref >60)
Glucose, Bld: 133 mg/dL — ABNORMAL HIGH (ref 70–99)
Potassium: 3.9 mmol/L (ref 3.5–5.1)
Sodium: 132 mmol/L — ABNORMAL LOW (ref 135–145)
Total Bilirubin: 0.8 mg/dL (ref 0.3–1.2)
Total Protein: 4.6 g/dL — ABNORMAL LOW (ref 6.5–8.1)

## 2020-03-02 LAB — D-DIMER, QUANTITATIVE: D-Dimer, Quant: 7.2 ug/mL-FEU — ABNORMAL HIGH (ref 0.00–0.50)

## 2020-03-02 LAB — FERRITIN: Ferritin: 971 ng/mL — ABNORMAL HIGH (ref 24–336)

## 2020-03-02 LAB — C-REACTIVE PROTEIN: CRP: 1.1 mg/dL — ABNORMAL HIGH (ref <1.0)

## 2020-03-02 MED ORDER — SODIUM CHLORIDE 0.9 % IV SOLN: INTRAVENOUS | Status: AC

## 2020-03-02 MED ORDER — SODIUM CHLORIDE 0.9 % IV SOLN: INTRAVENOUS | Status: DC

## 2020-03-02 NOTE — Progress Notes (Addendum)
Ryan Turner  SVX:[id number] DOB: 03-27-1951 DOA: 02/20/2020 PCP: [name], MD    Brief Narrative:  69 year old with a history of HTN and IBS who was diagnosed with Covid 7/26 and presented to the ED 8/1 with worsening shortness of breath.  He was found to be acutely hypoxic with Covid pneumonia.  His hospital course has been complicated by severe persistent hypoxia requiring ongoing heated high flow nasal cannula support.  He has also been confirmed to have a left lower extremity DVT.  Significant Events:  8/1 admit via [facility]  Date of Positive COVID Test:  7/26  Vaccination Status: Unvaccinated  COVID-19 specific Treatment: Steroid 8/1 > Remdesivir 8/1 > 8/5 Actemra 8/2  Antimicrobials:  None  DVT prophylaxis: Lovenox  Subjective: PT suggest patient is appropriate for HH PT.  No OT follow-up suggested.  Patient has been weaned to 3 L high flow nasal cannula and therefore can likely be transitioned to conventional nasal cannula.  He is in good spirits and interactive.  He has no new complaints.  I have answered many questions for him regarding his ultimate disposition.  It is likely he will be ready for discharge within 24 hours.  Assessment & Plan:  COVID Pneumonia -acute hypoxic respiratory failure suspect now in fibrotic stage of ARDS -serial chest x-rays without change -completed course of remdesivir -no evidence of volume overload with significant negative balance -steroids being tapered -titrate oxygen as able  Recent Labs  Lab 02/26/20 0656 02/26/20 0656 02/27/20 1628 02/28/20 0218 02/29/20 0710 03/01/20 0147 03/02/20 1135  DDIMER 19.30*   < > 16.05* 15.06* 13.78* 11.87* 7.20*  FERRITIN 837*  --  905*  --  1,300* 1,121* 971*  CRP 0.7  --  1.1*  --  <0.5 0.8 1.1*  ALT  --   --  82* 87* 72* 60* 42   < > = values in this interval not displayed.    Left lower extremity DVT Felt to be provoked by Covid - Lovenox transitioned to Eliquis -we will plan  for 3 months of Eliquis  Thrombocytopenia Mild and stable for now  Transaminitis Due to Covid and treatment of same -essentially resolved at this time  Mild hyperkalemia Corrected with use of Lokelma which we will stop today  HTN Borderline hypotensive at present -patient states he does not take beta-blocker at home therefore I am stopping his propranolol -gently hydrate today and follow  Mild hyponatremia Likely due to hypovolemia -gently hydrate today and follow-up in a.m.  Obesity - Body mass index is 33.39 kg/m.    Code Status: NO CODE BLUE Family Communication: Spoke with son via phone at length Status is: Inpatient  Remains inpatient appropriate because:Inpatient level of care appropriate due to severity of illness   Dispo: The patient is from: Home              Anticipated d/c is to: Home              Anticipated d/c date is: 3 days              Patient currently is not medically stable to d/c.  Consultants:  none  Objective: Blood pressure 100/71, pulse 73, temperature 98.7 F (37.1 C), temperature source Oral, resp. rate 20, height 6\' 1"  (1.854 m), weight 114.8 kg, SpO2 99 %.  Intake/Output Summary (Last 24 hours) at 03/02/2020 1804 Last data filed at 03/02/2020 1354 Gross per 24 hour  Intake 530 ml  Output 900 ml  Net -370 ml   Filed Weights   02/21/20 1700 02/25/20 0400  Weight: 115.7 kg 114.8 kg    Examination: General: No acute respiratory distress Lungs: Diffuse fine crackles bilaterally -no wheezing Cardiovascular: RRR without murmur or rub Abdomen: NT/ND, soft, BS positive Extremities: No significant cyanosis, clubbing, or edema bilateral lower extremities  CBC: Recent Labs  Lab 02/29/20 0710 03/01/20 0147 03/02/20 1135  WBC 8.2 8.9 9.7  HGB 16.8 16.3 15.7  HCT 50.2 49.3 46.1  MCV 92.6 93.2 93.5  PLT 133* 121* 683*   Basic Metabolic Panel: Recent Labs  Lab 02/29/20 0710 03/01/20 0147 03/02/20 1135  NA 134* 133* 132*  K 4.7  4.3 3.9  CL 93* 94* 94*  CO2 30 30 30   GLUCOSE 95 108* 133*  BUN 25* 31* 15  CREATININE 0.86 0.98 0.87  CALCIUM 8.7* 8.6* 8.0*   GFR: Estimated Creatinine Clearance: 103.4 mL/min (by C-G formula based on SCr of 0.87 mg/dL).  Liver Function Tests: Recent Labs  Lab 02/28/20 0218 02/29/20 0710 03/01/20 0147 03/02/20 1135  AST 48* 33 29 20  ALT 87* 72* 60* 42  ALKPHOS 128* 115 105 77  BILITOT 0.8 1.1 0.7 0.8  PROT 4.9* 5.4* 4.8* 4.6*  ALBUMIN 2.4* 2.6* 2.4* 2.3*     Recent Results (from the past 240 hour(s))  MRSA PCR Screening     Status: None   Collection Time: 02/23/20  6:46 AM   Specimen: Nasal Mucosa; Nasopharyngeal  Result Value Ref Range Status   MRSA by PCR NEGATIVE NEGATIVE Final    Comment:        The GeneXpert MRSA Assay (FDA approved for NASAL specimens only), is one component of a comprehensive MRSA colonization surveillance program. It is not intended to diagnose MRSA infection nor to guide or monitor treatment for MRSA infections. Performed at [facility], [place] [address], [place], [place] [address]      Scheduled Meds: . apixaban  10 mg Oral BID   Followed by  . [START ON 03/08/2020] apixaban  5 mg Oral BID  . chlorpheniramine-HYDROcodone  5 mL Oral Q12H  . docusate sodium  200 mg Oral Daily  . Ipratropium-Albuterol  1 puff Inhalation Q6H  . mouth rinse  15 mL Mouth Rinse BID  . multivitamin with minerals  1 tablet Oral Daily  . pantoprazole  40 mg Oral Q1200  . predniSONE  40 mg Oral Q breakfast     LOS: 11 days   Cherene Altes, MD [facility] Office  [phone] Pager - Text Page per Amion  If 7PM-7AM, please contact night-coverage per Amion 03/02/2020, 6:04 PM

## 2020-03-02 NOTE — Progress Notes (Signed)
Occupational Therapy Treatment Patient Details Name: Ryan Turner MRN: [id number] DOB: Sep 09, 1950 Today's Date: 03/02/2020    History of present illness Pt is 69 yo male with past medical history for hypertension, diverticulosis, and irritable bowel syndrome who presents with worsening dyspnea.  Patient tested positive for COVID-19 on July 26.  His symptoms initially improved but then worsened and now admitted 02/20/20 with resp failure due to COVID 19 viral PNA. Additionally with + R LE DVT on anticoagulation.   OT comments  Pt progressing well toward stated goals, despite episodes of hypotension this date. Session focused on BADL mobility progression with ECS implementation. BP 108/71 at start of session and remained the same throughout session. Pt endorses signs of dizziness that begins to slightly worsen with mobility. Pt was able to complete toilet transfer at start of session with supervision assist for lines. He then completed functional mobility a houeshold distance with RW and supervision assist. At this time, pt reports slight increased dizziness so mobility was ceased. Reviewed ECS strategies for pursed lip breathing, self managing SpO2, and pacing self. Pt completed IS and flutter training at end of session. D/c recs remain appropriate, will continue to follow.   Follow Up Recommendations  No OT follow up;Supervision - Intermittent    Equipment Recommendations  3 in 1 bedside commode    Recommendations for Other Services      Precautions / Restrictions Precautions Precautions: Fall;Other (comment) Precaution Comments: Watch SpO2 and BP Restrictions Weight Bearing Restrictions: No       Mobility Bed Mobility               General bed mobility comments: up in chair  Transfers Overall transfer level: Needs assistance Equipment used: Rolling walker (2 wheeled) Transfers: Sit to/from Stand Sit to Stand: Supervision         General transfer comment: supervision  for line management; pt prefer RW usage due to feeling weak and unsteady/dizzy from bouts of hypotension today    Balance Overall balance assessment: Needs assistance   Sitting balance-Leahy Scale: Good     Standing balance support: Bilateral upper extremity supported;No upper extremity supported Standing balance-Leahy Scale: Fair Standing balance comment: Stability improved with RW                           ADL either performed or assessed with clinical judgement   ADL Overall ADL's : Needs assistance/impaired     Grooming: Supervision/safety;Standing               Lower Body Dressing: Set up;Sitting/lateral leans;Sit to/from stand Lower Body Dressing Details (indicate cue type and reason): donning slippers Toilet Transfer: Supervision/safety;Regular Toilet;RW;Ambulation Toilet Transfer Details (indicate cue type and reason): to toilet in room for transfer; supervision for line management Toileting- Clothing Manipulation and Hygiene: Independent Toileting - Clothing Manipulation Details (indicate cue type and reason): posterior peri care     Functional mobility during ADLs: Supervision/safety General ADL Comments: pt able to progress mobility despite episodes of hypotension today     Vision Patient Visual Report: No change from baseline     Perception     Praxis      Cognition Arousal/Alertness: Awake/alert Behavior During Therapy: WFL for tasks assessed/performed Overall Cognitive Status: Within Functional Limits for tasks assessed  Exercises     Shoulder Instructions       General Comments      Pertinent Vitals/ Pain       Pain Assessment: No/denies pain  Home Living                                          Prior Functioning/Environment              Frequency  Min 3X/week        Progress Toward Goals  OT Goals(current goals can now be found in the  care plan section)  Progress towards OT goals: Progressing toward goals  Acute Rehab OT Goals Patient Stated Goal: recover and go home OT Goal Formulation: With patient Time For Goal Achievement: 03/09/20 Potential to Achieve Goals: Good  Plan Discharge plan remains appropriate    Co-evaluation                 AM-PAC OT "6 Clicks" Daily Activity     Outcome Measure   Help from another person eating meals?: None Help from another person taking care of personal grooming?: A Little Help from another person toileting, which includes using toliet, bedpan, or urinal?: A Little Help from another person bathing (including washing, rinsing, drying)?: A Little Help from another person to put on and taking off regular upper body clothing?: A Little Help from another person to put on and taking off regular lower body clothing?: A Little 6 Click Score: 19    End of Session Equipment Utilized During Treatment: Rolling walker;Oxygen  OT Visit Diagnosis: Other (comment) (decreased cardiopulmonary tolerance)   Activity Tolerance Patient tolerated treatment well   Patient Left in chair;with call bell/phone within reach   Nurse Communication Mobility status        Time: 7903-8333 OT Time Calculation (min): 26 min  Charges: OT General Charges $OT Visit: 1 Visit OT Treatments $Self Care/Home Management : 8-22 mins $Therapeutic Activity: 8-22 mins  Zenovia Jarred, MSOT, OTR/L Acute Rehabilitation Services [facility] Office Number: [phone] Pager: [phone]  Zenovia Jarred 03/02/2020, 6:03 PM

## 2020-03-03 LAB — COMPREHENSIVE METABOLIC PANEL
ALT: 43 U/L (ref 0–44)
AST: 24 U/L (ref 15–41)
Albumin: 2.2 g/dL — ABNORMAL LOW (ref 3.5–5.0)
Alkaline Phosphatase: 70 U/L (ref 38–126)
Anion gap: 9 (ref 5–15)
BUN: 23 mg/dL (ref 8–23)
CO2: 30 mmol/L (ref 22–32)
Calcium: 8.2 mg/dL — ABNORMAL LOW (ref 8.9–10.3)
Chloride: 97 mmol/L — ABNORMAL LOW (ref 98–111)
Creatinine, Ser: 1.08 mg/dL (ref 0.61–1.24)
GFR calc Af Amer: >60 mL/min (ref >60)
GFR calc non Af Amer: >60 mL/min (ref >60)
Glucose, Bld: 93 mg/dL (ref 70–99)
Potassium: 4.3 mmol/L (ref 3.5–5.1)
Sodium: 136 mmol/L (ref 135–145)
Total Bilirubin: 0.8 mg/dL (ref 0.3–1.2)
Total Protein: 4.6 g/dL — ABNORMAL LOW (ref 6.5–8.1)

## 2020-03-03 LAB — MAGNESIUM: Magnesium: 2 mg/dL (ref 1.7–2.4)

## 2020-03-03 LAB — CBC
HCT: 43.8 % (ref 39.0–52.0)
Hemoglobin: 14.4 g/dL (ref 13.0–17.0)
MCH: 31.4 pg (ref 26.0–34.0)
MCHC: 32.9 g/dL (ref 30.0–36.0)
MCV: 95.4 fL (ref 80.0–100.0)
Platelets: 141 10*3/uL — ABNORMAL LOW (ref 150–400)
RBC: 4.59 MIL/uL (ref 4.22–5.81)
RDW: 13.1 % (ref 11.5–15.5)
WBC: 7.6 10*3/uL (ref 4.0–10.5)
nRBC: 0 % (ref 0.0–0.2)

## 2020-03-03 MED ORDER — PREDNISONE 20 MG PO TABS: 40.0000 mg | ORAL_TABLET | Freq: Every day | ORAL | Status: DC

## 2020-03-03 MED ORDER — PREDNISONE 10 MG PO TABS: 10.0000 mg | ORAL_TABLET | Freq: Every day | ORAL | 0 refills | Status: DC

## 2020-03-03 MED ORDER — PREDNISONE 20 MG PO TABS: 40.0000 mg | ORAL_TABLET | Freq: Every day | ORAL | 0 refills | Status: AC

## 2020-03-03 MED ORDER — ACETAMINOPHEN 325 MG PO TABS: 650.0000 mg | ORAL_TABLET | Freq: Four times a day (QID) | ORAL | Status: DC | PRN

## 2020-03-03 MED ORDER — PREDNISONE 10 MG PO TABS: 10.0000 mg | ORAL_TABLET | Freq: Every day | ORAL | Status: DC

## 2020-03-03 MED ORDER — APIXABAN 5 MG PO TABS
5.0000 mg | ORAL_TABLET | Freq: Two times a day (BID) | ORAL | 1 refills | Status: DC
Start: 2020-04-08 — End: 2020-03-22

## 2020-03-03 MED ORDER — HYDROCOD POLST-CPM POLST ER 10-8 MG/5ML PO SUER: 5.0000 mL | Freq: Two times a day (BID) | ORAL | Status: DC | PRN

## 2020-03-03 MED ORDER — APIXABAN (ELIQUIS) VTE STARTER PACK (10MG AND 5MG)
ORAL_TABLET | ORAL | 0 refills | Status: DC
Start: 2020-03-03 — End: 2020-03-09

## 2020-03-03 MED ORDER — PREDNISONE 20 MG PO TABS: 30.0000 mg | ORAL_TABLET | Freq: Every day | ORAL | Status: DC

## 2020-03-03 MED ORDER — PREDNISONE 20 MG PO TABS: 20.0000 mg | ORAL_TABLET | Freq: Every day | ORAL | Status: DC

## 2020-03-03 MED ORDER — PREDNISONE 10 MG PO TABS: 30.0000 mg | ORAL_TABLET | Freq: Every day | ORAL | 0 refills | Status: DC

## 2020-03-03 MED ORDER — PREDNISONE 20 MG PO TABS: 20.0000 mg | ORAL_TABLET | Freq: Every day | ORAL | 0 refills | Status: AC

## 2020-03-03 MED FILL — ELIQUIS STARTER PACK 5 MG T: 5 | 30 days supply | Qty: 74 | Fill #0

## 2020-03-03 NOTE — Discharge Summary (Signed)
DISCHARGE SUMMARY  Ryan Turner  MR#: [id number]  DOB:Dec 15, 1950  Date of Admission: 02/20/2020 Date of Discharge: 03/03/2020  Attending Physician:Ifeoma Vallin Hennie Duos, MD  Patient's JXB:[id number], [name], MD  Consults: none   Disposition: D/C home   Date of Positive COVID Test: 02/14/20  Date Isolation Ends: 03/03/20  COVID-19 specific Treatment: Steroid 8/1 > slow taper over 3+ weeks post discharge  Remdesivir 8/1 > 8/5 Actemra 8/2  Follow-up Appts:  Follow-up Information    [name], MD. Schedule an appointment as soon as possible for a visit in 5 day(s).   Specialty: Family Medicine Contact information: [address] [place] [address] [phone]        [facility] Follow up.   Why: The office should call you within 2-3 days to establish a follow-up appointment.               Tests Needing Follow-up: -pt will need refill for Eliquis to complete full 3 months of tx for his acute DVT -assess O2 sats at rest and w/ ambulation to determine if weaning of O2 possible -f/u BMET to assess K+ and renal fxn   Discharge Diagnoses: COVID Pneumonia Acute hypoxic respiratory failure Acute Left lower extremity DVT Thrombocytopenia Transaminitis Mild hyperkalemia HTN Mild hyponatremia Obesity - Body mass index is 33.39 kg/m.  Initial presentation: 69 year old with a history of HTN and IBS who was diagnosed with Covid 7/26 and presented to the ED 8/1 with worsening shortness of breath.  He was found to be acutely profoundly hypoxic with Covid pneumonia.  His hospital course was complicated by severe persistent hypoxia requiring prolonged heated high flow nasal cannula support.  He was also confirmed to have a left lower extremity DVT.  Hospital Course:  COVID Pneumonia -acute hypoxic respiratory failure suspect now in fibrotic stage of ARDS -serial chest x-rays without change -completed course of remdesivir -no evidence of volume  overload with significant negative balance at time of d/c -steroids being tapered very slowly given persistent hypoxia -titrate oxygen as able - home O2 arranged (3L at rest - up to 6L w/ exertion)  Acute Left lower extremity DVT Felt to be provoked by Covid - Lovenox transitioned to Eliquis -we will plan for 3 months of Eliquis  Thrombocytopenia Mild and stable at time of d/c   Transaminitis Due to Covid and treatment of same -essentially resolved at time of d/c   Mild hyperkalemia Corrected with use of Lokelma which was stopped prior to d/c   HTN patient states he does not take beta-blocker at home therefore I am stopped propranolol which was initiated as an inpatient - gently hydrated prior to d/c - BP stable   Mild hyponatremia Likely due to hypovolemia - corrected w/ hydration   Obesity - Body mass index is 33.39 kg/m.   Allergies as of 03/03/2020      Reactions   Morphine    Other reaction(s): VOMITING      Medication List    TAKE these medications   acetaminophen 325 MG tablet Commonly known as: TYLENOL Take 2 tablets (650 mg total) by mouth every 6 (six) hours as needed for mild pain or headache (fever >/= 101).   Apixaban Starter Pack (10mg  and 5mg ) Commonly known as: ELIQUIS STARTER PACK Take as directed on package: start with two-5mg  tablets twice daily for 7 days. On day 8, switch to one-5mg  tablet twice daily.   apixaban 5 MG Tabs tablet Commonly known as: ELIQUIS Take 1  tablet (5 mg total) by mouth 2 (two) times daily. Start taking on: April 08, 2020   celecoxib 200 MG capsule Commonly known as: CELEBREX TAKE 1 CAPSULE (200 MG TOTAL) BY MOUTH AS NEEDED. What changed:   when to take this  reasons to take this   docusate sodium 100 MG capsule Commonly known as: COLACE Take 200 mg by mouth daily as needed for mild constipation or moderate constipation.   Fish Oil 1200 MG Caps Take 2 capsules by mouth daily.   fluticasone 50 MCG/ACT  nasal spray Commonly known as: FLONASE Place into both nostrils as needed for allergies or rhinitis.   Glucosamine Chondroitin Complx Caps Take 2 capsules by mouth daily. 1800 mg   OCUVITE PRESERVISION PO Take 2 tablets by mouth daily.   predniSONE 20 MG tablet Commonly known as: DELTASONE Take 2 tablets (40 mg total) by mouth daily with breakfast for 4 days. Start taking on: March 04, 2020   predniSONE 10 MG tablet Commonly known as: DELTASONE Take 3 tablets (30 mg total) by mouth daily with breakfast for 7 days. Start taking on: March 08, 2020   predniSONE 20 MG tablet Commonly known as: DELTASONE Take 1 tablet (20 mg total) by mouth daily with breakfast for 7 days. Start taking on: March 15, 2020   predniSONE 10 MG tablet Commonly known as: DELTASONE Take 1 tablet (10 mg total) by mouth daily with breakfast for 7 days. Start taking on: March 22, 2020   sildenafil 100 MG tablet Commonly known as: Viagra Take 0.5-1 tablets (50-100 mg total) by mouth daily as needed for erectile dysfunction.   zolpidem 10 MG tablet Commonly known as: AMBIEN Take 0.5-1 tablets (5-10 mg total) by mouth at bedtime as needed. for sleep            Durable Medical Equipment  (From admission, onward)         Start     Ordered   03/03/20 1049  For home use only DME oxygen  Once       Question Answer Comment  Length of Need 6 Months   Mode or (Route) Nasal cannula   Liters per Minute 6   Frequency Continuous (stationary and portable oxygen unit needed)   Oxygen conserving device Yes   Oxygen delivery system Gas      03/03/20 1049   03/03/20 1033  For home use only DME 3 n 1  Once        03/03/20 1032   03/03/20 1033  For home use only DME Walker rolling  Once       Question Answer Comment  Walker: With [place]   Patient needs a walker to treat with the following condition Pneumonia due to COVID-19 virus   Patient needs a walker to treat with the following condition  Physical deconditioning      03/03/20 1033          Day of Discharge BP 104/67 (BP Location: Left Arm)   Pulse 66   Temp 98.4 F (36.9 C) (Oral)   Resp 18   Ht 6\' 1"  (1.854 m)   Wt 114.8 kg   SpO2 (!) 80%   BMI 33.39 kg/m   Physical Exam: General: No acute respiratory distress Lungs: fine diffuse crackles - no wheezing  Cardiovascular: Regular rate and rhythm without murmur gallop or rub normal S1 and S2 Abdomen: Nontender, nondistended, soft, bowel sounds positive, no rebound, no ascites, no appreciable mass Extremities: No significant  cyanosis, clubbing, or edema bilateral lower extremities  Basic Metabolic Panel: Recent Labs  Lab 02/28/20 0218 02/29/20 0710 03/01/20 0147 03/02/20 1135 03/03/20 0541  NA 133* 134* 133* 132* 136  K 5.3* 4.7 4.3 3.9 4.3  CL 97* 93* 94* 94* 97*  CO2 28 30 30 30 30   GLUCOSE 112* 95 108* 133* 93  BUN 26* 25* 31* 15 23  CREATININE 0.99 0.86 0.98 0.87 1.08  CALCIUM 8.4* 8.7* 8.6* 8.0* 8.2*  MG  --   --   --   --  2.0    Liver Function Tests: Recent Labs  Lab 02/28/20 0218 02/29/20 0710 03/01/20 0147 03/02/20 1135 03/03/20 0541  AST 48* 33 29 20 24   ALT 87* 72* 60* 42 43  ALKPHOS 128* 115 105 77 70  BILITOT 0.8 1.1 0.7 0.8 0.8  PROT 4.9* 5.4* 4.8* 4.6* 4.6*  ALBUMIN 2.4* 2.6* 2.4* 2.3* 2.2*   CBC: Recent Labs  Lab 02/28/20 0218 02/29/20 0710 03/01/20 0147 03/02/20 1135 03/03/20 0541  WBC 9.5 8.2 8.9 9.7 7.6  HGB 15.8 16.8 16.3 15.7 14.4  HCT 47.6 50.2 49.3 46.1 43.8  MCV 92.8 92.6 93.2 93.5 95.4  PLT 141* 133* 121* 107* 141*    Recent Results (from the past 240 hour(s))  MRSA PCR Screening     Status: None   Collection Time: 02/23/20  6:46 AM   Specimen: Nasal Mucosa; Nasopharyngeal  Result Value Ref Range Status   MRSA by PCR NEGATIVE NEGATIVE Final    Comment:        The GeneXpert MRSA Assay (FDA approved for NASAL specimens only), is one component of a comprehensive MRSA colonization surveillance  program. It is not intended to diagnose MRSA infection nor to guide or monitor treatment for MRSA infections. Performed at [facility], [place] [address] [place], [place] [address]       Time spent in discharge (includes decision making & examination of pt): 35 minutes  03/03/2020, 3:42 PM   Cherene Altes, MD [facility] Office  [phone]

## 2020-03-03 NOTE — Progress Notes (Signed)
SATURATION QUALIFICATIONS: (This note is used to comply with regulatory documentation for home oxygen)  Patient Saturations on Room Air at Rest = 97 %  Patient Saturations on Room Air while Ambulating = 82%  Patient Saturations on 6 Liters of oxygen while Ambulating = 92%  Please briefly explain why patient needs home oxygen: Patient ambulated down hallway and his oxygen saturations dropped to 82 % and he became short of breath on room air and when increased oxygen to 6 LPM via nasal cannula it came back up to 92%. Patient at rest is oxygen saturation is 97%

## 2020-03-03 NOTE — TOC Transition Note (Signed)
Transition of Care [facility]) - CM/SW Discharge Note   Patient Details  Name: Ryan Turner MRN: [id number] Date of Birth: May 20, 1951  Transition of Care [facility]) CM/SW Contact:  Bartholomew Crews, RN Phone Number: [phone] 03/03/2020, 5:28 PM   Clinical Narrative:     Notified of patient's need for DME oxygen. DME order placed. Pulm note placed. Referral to [employer] for delivery to the room.   Noted HH PT order. Referral accepted by [facility].   Spoke with patient on his mobile phone. Advised of delivery of oxygen to the room, and that [facility] would follow up for [facility] PT. Patient expressed appreciation.   No further TOC needs identified.    Final next level of care: [place] Barriers to Discharge: No Barriers Identified   Patient Goals and CMS Choice        Discharge Placement                       Discharge Plan and Services                DME Arranged: Oxygen DME Agency: [employer] Date DME Agency Contacted: 03/03/20 Time DME Agency Contacted: [id number] Representative spoke with at DME Agency: [place]: PT [place]: [place] Date [place]: 03/03/20 Time [place]: [id number] Representative spoke with at [place]: [place] ([place]) Interventions     Readmission Risk Interventions No flowsheet data found.

## 2020-03-03 NOTE — Progress Notes (Signed)
Physical Therapy Treatment Patient Details Name: Ryan Turner MRN: [id number] DOB: 04-Jan-1951 Today's Date: 03/03/2020    History of Present Illness Pt is 69 yo male with PMH of HTN, diverticulosis, and IBS, who tested (+) for COVID-19 on July 26.  His symptoms initially improved but then worsened and now admitted 02/20/20 with resp failure due to COVID 19 viral PNA. Additionally with + R LE DVT on anticoagulation.   PT Comments    Pt progressing well with mobility; ambulatory with RW at supervision-[place] preparing to d/c home this afternoon. Asymptomatic with BP stable this session. Pt requires 4L O2 [place] at rest and 6L O2 [place] to maintain SpO2 with short ambulation distance. Reviewed educ re: activity recommendations, O2 needs, pulse ox monitoring, energy conservation strategies, fall risk reduction (including RW use). Continue to recommend follow-up with HHPT services to maximize functional mobility and independence. Will have necessary assist from family upon return home.   Follow Up Recommendations  Home health PT     Equipment Recommendations  Rolling walker with 5" wheels    Recommendations for Other Services       Precautions / Restrictions Precautions Precautions: Fall;Other (comment) Precaution Comments: Watch SpO2 Restrictions Weight Bearing Restrictions: No    Mobility  Bed Mobility               General bed mobility comments: up in chair  Transfers Overall transfer level: Modified independent Equipment used: Rolling walker (2 wheeled) Transfers: Sit to/from Stand              Ambulation/Gait Ambulation/Gait assistance: Supervision Gait Distance (Feet): 300 Feet Assistive device: Rolling walker (2 wheeled) Gait Pattern/deviations: Step-through pattern;Decreased stride length   Gait velocity interpretation: 1.31 - 2.62 ft/sec, indicative of limited community ambulator General Gait Details: Slow, steady gait with RW and supervision to monitor SpO2.  Pt with SpO2 down to 80s requiring 6L O2 [place]   Stairs             Wheelchair Mobility    Modified Rankin (Stroke Patients Only)       Balance Overall balance assessment: Needs assistance   Sitting balance-Leahy Scale: Good     Standing balance support: Bilateral upper extremity supported;No upper extremity supported Standing balance-Leahy Scale: Fair Standing balance comment: Stability improved with RW                            Cognition Arousal/Alertness: Awake/alert Behavior During Therapy: WFL for tasks assessed/performed Overall Cognitive Status: Within Functional Limits for tasks assessed                                        Exercises      General Comments General comments (skin integrity, edema, etc.): Resting BP 117/67, post-session BP 131/75; pt asymptomatic. SpO2 maintaining 92-94% on 4L O2 [place] at rest; required 6L O2 [place] to maintain >/88% with short ambulation distance. Educ on current O2 needs, recommend for shorter ambulation distance and activity recs for return home; pt with therex handout and instructions written down, including taking home IS/FV to continue using at least the next couple weeks. Energy conservation handout provided and discussed, specifically regarding ADL tasks      Pertinent Vitals/Pain Pain Assessment: Faces Faces Pain Scale: Hurts a little bit Pain Location: R-side chest from coughing Pain Descriptors / Indicators: Guarding Pain Intervention(s): Monitored during  session    Home Living                      Prior Function            PT Goals (current goals can now be found in the care plan section) Progress towards PT goals: Progressing toward goals    Frequency    Min 3X/week      PT Plan Current plan remains appropriate    Co-evaluation              AM-PAC PT "6 Clicks" Mobility   Outcome Measure  Help needed turning from your back to your side while in a flat bed  without using bedrails?: None Help needed moving from lying on your back to sitting on the side of a flat bed without using bedrails?: None Help needed moving to and from a bed to a chair (including a wheelchair)?: None Help needed standing up from a chair using your arms (e.g., wheelchair or bedside chair)?: None Help needed to walk in hospital room?: None Help needed climbing 3-5 steps with a railing? : A Little 6 Click Score: 23    End of Session Equipment Utilized During Treatment: Oxygen Activity Tolerance: Patient tolerated treatment well Patient left: in chair;with call bell/phone within reach Nurse Communication: Mobility status PT Visit Diagnosis: Other abnormalities of gait and mobility (R26.89)     Time: 4982-6415 PT Time Calculation (min) (ACUTE ONLY): 27 min  Charges:  $Therapeutic Exercise: 8-22 mins $Self Care/Home Management: 8-22                    Mabeline Caras, PT, DPT Acute Rehabilitation Services  Pager [phone] Office [place] 03/03/2020, 3:41 PM

## 2020-03-08 DIAGNOSIS — M519 Unspecified thoracic, thoracolumbar and lumbosacral intervertebral disc disorder: Secondary | ICD-10-CM | POA: Diagnosis not present

## 2020-03-08 DIAGNOSIS — J1282 Pneumonia due to coronavirus disease 2019: Secondary | ICD-10-CM | POA: Diagnosis not present

## 2020-03-08 DIAGNOSIS — Z6831 Body mass index (BMI) 31.0-31.9, adult: Secondary | ICD-10-CM | POA: Diagnosis not present

## 2020-03-08 DIAGNOSIS — K589 Irritable bowel syndrome without diarrhea: Secondary | ICD-10-CM | POA: Diagnosis not present

## 2020-03-08 DIAGNOSIS — K5790 Diverticulosis of intestine, part unspecified, without perforation or abscess without bleeding: Secondary | ICD-10-CM | POA: Diagnosis not present

## 2020-03-08 DIAGNOSIS — U071 COVID-19: Secondary | ICD-10-CM | POA: Diagnosis not present

## 2020-03-08 DIAGNOSIS — D696 Thrombocytopenia, unspecified: Secondary | ICD-10-CM | POA: Diagnosis not present

## 2020-03-08 DIAGNOSIS — E669 Obesity, unspecified: Secondary | ICD-10-CM | POA: Diagnosis not present

## 2020-03-08 DIAGNOSIS — F17210 Nicotine dependence, cigarettes, uncomplicated: Secondary | ICD-10-CM | POA: Diagnosis not present

## 2020-03-08 DIAGNOSIS — I82402 Acute embolism and thrombosis of unspecified deep veins of left lower extremity: Secondary | ICD-10-CM | POA: Diagnosis not present

## 2020-03-08 DIAGNOSIS — J9601 Acute respiratory failure with hypoxia: Secondary | ICD-10-CM | POA: Diagnosis not present

## 2020-03-08 DIAGNOSIS — Z7901 Long term (current) use of anticoagulants: Secondary | ICD-10-CM | POA: Diagnosis not present

## 2020-03-08 DIAGNOSIS — Z791 Long term (current) use of non-steroidal anti-inflammatories (NSAID): Secondary | ICD-10-CM | POA: Diagnosis not present

## 2020-03-08 DIAGNOSIS — I10 Essential (primary) hypertension: Secondary | ICD-10-CM | POA: Diagnosis not present

## 2020-03-08 DIAGNOSIS — Z9181 History of falling: Secondary | ICD-10-CM | POA: Diagnosis not present

## 2020-03-09 ENCOUNTER — Other Ambulatory Visit: Payer: Self-pay

## 2020-03-09 ENCOUNTER — Encounter: Payer: Self-pay | Admitting: Family Medicine

## 2020-03-09 ENCOUNTER — Ambulatory Visit (INDEPENDENT_AMBULATORY_CARE_PROVIDER_SITE_OTHER): Payer: BC Managed Care – PPO | Admitting: Family Medicine

## 2020-03-09 VITALS — BP 128/76 | HR 100 | Temp 98.4°F | Resp 22 | Ht 73.5 in | Wt 244.0 lb

## 2020-03-09 DIAGNOSIS — J1282 Pneumonia due to coronavirus disease 2019: Secondary | ICD-10-CM

## 2020-03-09 DIAGNOSIS — U071 COVID-19: Secondary | ICD-10-CM

## 2020-03-09 DIAGNOSIS — R7401 Elevation of levels of liver transaminase levels: Secondary | ICD-10-CM | POA: Diagnosis not present

## 2020-03-09 DIAGNOSIS — Z09 Encounter for follow-up examination after completed treatment for conditions other than malignant neoplasm: Secondary | ICD-10-CM

## 2020-03-09 DIAGNOSIS — Z8616 Personal history of COVID-19: Secondary | ICD-10-CM

## 2020-03-09 DIAGNOSIS — E876 Hypokalemia: Secondary | ICD-10-CM | POA: Diagnosis not present

## 2020-03-09 DIAGNOSIS — I8290 Acute embolism and thrombosis of unspecified vein: Secondary | ICD-10-CM

## 2020-03-09 MED ORDER — CLOTRIMAZOLE-BETAMETHASONE 1-0.05 % EX CREA: 1.0000 "application " | TOPICAL_CREAM | Freq: Two times a day (BID) | CUTANEOUS | 0 refills | Status: DC

## 2020-03-09 NOTE — Progress Notes (Signed)
Subjective:    Patient ID: Ryan Turner, male    DOB: 1951-03-25, 69 y.o.   MRN: [id number]  HPI  Patient is a very pleasant 69 year old Caucasian male here today for a follow-up from his hospital discharge.  Unfortunately the patient was admitted August 1 with shortness of breath, cough, and hypoxia associated with Covid pneumonia.  I have copied relevant portions of the discharge summary below for my record: Date of Admission: 02/20/2020 Date of Discharge: 03/03/2020  Attending Physician:Jeffrey Hennie Duos, MD  Patient's VOJ:[id number], [name], MD  Consults: none   Disposition: D/C home   Date of Positive COVID Test: 02/14/20  Date Isolation Ends: 03/03/20  COVID-19 specific Treatment: Steroid 8/1 > slow taper over 3+ weeks post discharge  Remdesivir 8/1 > 8/5 Actemra 8/2  Follow-up Appts:      Follow-up Information    [name], MD. Schedule an appointment as soon as possible for a visit in 5 day(s).   Specialty: Family Medicine Contact information: [address] [place] [address] [phone]             [facility] Follow up.   Why: The office should call you within 2-3 days to establish a follow-up appointment.                Tests Needing Follow-up: -pt will need refill for Eliquis to complete full 3 months of tx for his acute DVT -assess O2 sats at rest and w/ ambulation to determine if weaning of O2 possible -f/u BMET to assess K+ and renal fxn   Discharge Diagnoses: COVID Pneumonia Acute hypoxic respiratory failure Acute Left lower extremity DVT Thrombocytopenia Transaminitis Mild hyperkalemia HTN Mild hyponatremia Obesity -Body mass index is 33.39 kg/m.  Initial presentation: 69 year old with a history of HTN and IBS who was diagnosed with Covid 7/26 and presented to the ED 8/1 with worsening shortness of breath. He was found to be acutely profoundly hypoxic with Covid pneumonia. His  hospital course was complicated by severe persistent hypoxia requiring prolonged heated high flow nasal cannula support. He was also confirmed to have a left lower extremity DVT.  Hospital Course:  COVID Pneumonia -acute hypoxic respiratory failure suspect now in fibrotic stage of ARDS -serial chest x-rays without change -completed course of remdesivir -no evidence of volume overload with significant negative balance at time of d/c -steroids being tapered very slowly given persistent hypoxia -titrate oxygen as able - home O2 arranged (3L at rest - up to 6L w/ exertion)  Acute Left lower extremity DVT Felt to be provoked by Covid - Lovenox transitioned to Eliquis-we will plan for 3 months of Eliquis  Thrombocytopenia Mild and stable at time of d/c   Transaminitis Due to Covid and treatment of same -essentially resolved at time of d/c   Mild hyperkalemia Corrected with use of Lokelma which was stopped prior to d/c   HTN patient states he does not take beta-blocker at home therefore I am stopped propranololwhich was initiated as an inpatient - gently hydrated prior to d/c - BP stable   Mild hyponatremia Likely due to hypovolemia- corrected w/ hydration  Patient is here today for follow-up.  He is on 2 L of oxygen.  Patient states that at rest he feels fine however he becomes very short of breath with minimal activity.  He states that his oxygen was dropped to 88% with minimal activity however it would quickly rise back to 95% on 2 L at rest.  He does have some right-sided anterior chest wall pain.  He has been taking Celebrex to help relieve that and the Celebrex is helping.  However he is on Eliquis due to a left lower extremity DVT that was diagnosed during his Covid pneumonia.  I have told the patient he should not take any NSAID along with Eliquis due to the risk of bleeding.  He believes the Tylenol will control the pain.  He denies any angina.  He denies any orthopnea.  He  denies any leg swelling.  He denies any hemoptysis.  He denies any fevers or chills.  He denies any purulent sputum.  On examination today he does have anterior crackles in the right lung but no crackles posteriorly and good air exchange.  He denies any abdominal pain, nausea, vomiting, diarrhea.  He denies any dysuria.  There is no evidence of a urinary tract infection complicating his hospitalization.  He is unable to return to work at this time due to the shortness of breath and hypoxia.  Hospitalization was complicated also by hypokalemia, hyponatremia, and transaminitis most likely due to to his severe infection.  Past Medical History:  Diagnosis Date   Cataract 2014   removed   DDD (degenerative disc disease), lumbar    Diverticulosis    Headache    Hypertension    IBS (irritable bowel syndrome)    Past Surgical History:  Procedure Laterality Date   cataracts     COLON SURGERY  1995   fistula   COLONOSCOPY     EYE SURGERY  2014   catarats/lens replaced   mohs  2015   skin cancer from nose   POLYPECTOMY     VASECTOMY  1985   Current Outpatient Medications on File Prior to Visit  Medication Sig Dispense Refill   acetaminophen (TYLENOL) 325 MG tablet Take 2 tablets (650 mg total) by mouth every 6 (six) hours as needed for mild pain or headache (fever >/= 101).     [START ON 04/08/2020] apixaban (ELIQUIS) 5 MG TABS tablet Take 1 tablet (5 mg total) by mouth 2 (two) times daily. 60 tablet 1   APIXABAN (ELIQUIS) VTE STARTER PACK (10MG  AND 5MG ) Take as directed on package: start with two-5mg  tablets twice daily for 7 days. On day 8, switch to one-5mg  tablet twice daily. 1 each 0   celecoxib (CELEBREX) 200 MG capsule TAKE 1 CAPSULE (200 MG TOTAL) BY MOUTH AS NEEDED. (Patient taking differently: Take 200 mg by mouth daily as needed for mild pain. ) 90 capsule 2   docusate sodium (COLACE) 100 MG capsule Take 200 mg by mouth daily as needed for mild constipation or moderate  constipation.      fluticasone (FLONASE) 50 MCG/ACT nasal spray Place into both nostrils as needed for allergies or rhinitis.     Glucosamine-Chondroit-Vit C-Mn (GLUCOSAMINE CHONDROITIN COMPLX) CAPS Take 2 capsules by mouth daily. 1800 mg     Multiple Vitamins-Minerals (OCUVITE PRESERVISION PO) Take 2 tablets by mouth daily.      Omega-3 Fatty Acids (FISH OIL) 1200 MG CAPS Take 2 capsules by mouth daily.     predniSONE (DELTASONE) 10 MG tablet Take 3 tablets (30 mg total) by mouth daily with breakfast for 7 days. 21 tablet 0   [START ON 03/22/2020] predniSONE (DELTASONE) 10 MG tablet Take 1 tablet (10 mg total) by mouth daily with breakfast for 7 days. 7 tablet 0   [START ON 03/15/2020] predniSONE (DELTASONE) 20 MG tablet Take 1 tablet (20  mg total) by mouth daily with breakfast for 7 days. 7 tablet 0   sildenafil (VIAGRA) 100 MG tablet Take 0.5-1 tablets (50-100 mg total) by mouth daily as needed for erectile dysfunction. 5 tablet 11   zolpidem (AMBIEN) 10 MG tablet Take 0.5-1 tablets (5-10 mg total) by mouth at bedtime as needed. for sleep 30 tablet 3   No current facility-administered medications on file prior to visit.   Allergies  Allergen Reactions   Morphine     Other reaction(s): VOMITING   Social History   Socioeconomic History   Marital status: Married    Spouse name: Not on file   Number of children: Not on file   Years of education: College   Highest education level: Not on file  Occupational History   Occupation: Broker  Tobacco Use   Smoking status: Current Some Day Smoker    Types: Cigars   Smokeless tobacco: Never Used   Tobacco comment: quit cigarettes in 1973  Vaping Use   Vaping Use: Never used  Substance and Sexual Activity   Alcohol use: Yes    Alcohol/week: 6.0 standard drinks    Types: 2 Cans of beer, 4 Shots of liquor per week   Drug use: No   Sexual activity: Yes    Birth control/protection: None  Other Topics Concern   Not on  file  Social History Narrative   Lives at home with wife.   Social Determinants of Health   Financial Resource Strain:    Difficulty of Paying Living Expenses: Not on file  Food Insecurity:    Worried About Charity fundraiser in the Last Year: Not on file   [employer] of Food in the Last Year: Not on file  Transportation Needs:    Lack of Transportation (Medical): Not on file   Lack of Transportation (Non-Medical): Not on file  Physical Activity:    Days of Exercise per Week: Not on file   Minutes of Exercise per Session: Not on file  Stress:    Feeling of Stress : Not on file  Social Connections:    Frequency of Communication with Friends and Family: Not on file   Frequency of Social Gatherings with Friends and Family: Not on file   Attends Religious Services: Not on file   Active Member of Clubs or Organizations: Not on file   Attends Archivist Meetings: Not on file   Marital Status: Not on file  Intimate Partner Violence:    Fear of Current or Ex-Partner: Not on file   Emotionally Abused: Not on file   Physically Abused: Not on file   Sexually Abused: Not on file      Review of Systems  All other systems reviewed and are negative.      Objective:   Physical Exam Vitals reviewed.  Constitutional:      General: He is not in acute distress.    Appearance: Normal appearance. He is normal weight. He is not ill-appearing or toxic-appearing.  HENT:     Right Ear: Tympanic membrane and ear canal normal.     Left Ear: Tympanic membrane and ear canal normal.     Nose: Nose normal. No congestion or rhinorrhea.     Mouth/Throat:     Pharynx: No oropharyngeal exudate or posterior oropharyngeal erythema.  Eyes:     General:        Right eye: No discharge.        Left eye: No discharge.  Conjunctiva/sclera: Conjunctivae normal.  Cardiovascular:     Rate and Rhythm: Normal rate and regular rhythm.     Pulses: Normal pulses.     Heart  sounds: Normal heart sounds. No murmur heard.  No friction rub.  Pulmonary:     Effort: No respiratory distress.     Breath sounds: Examination of the right-middle field reveals rales. Rales present. No wheezing or rhonchi.  Abdominal:     General: There is no distension.     Palpations: Abdomen is soft.     Tenderness: There is no abdominal tenderness. There is no guarding.  Musculoskeletal:     Cervical back: Neck supple.  Lymphadenopathy:     Cervical: No cervical adenopathy.  Skin:    Findings: No rash.  Neurological:     General: No focal deficit present.     Mental Status: He is alert and oriented to person, place, and time.     Cranial Nerves: No cranial nerve deficit.           Assessment & Plan:  Hospital discharge follow-up - Plan: CBC with Differential/Platelet, COMPLETE METABOLIC PANEL WITH GFR  Pneumonia due to COVID-19 virus  Deep vein thrombosis (DVT) associated with COVID-19  Hypokalemia  Transaminitis  Patient has an appointment to see pulmonology in September approximately 4 weeks from today.  That is when I would repeat his chest x-ray assuming he does not suffer any complications.  I suspect that the crackles I am hearing anteriorly are likely due to fibrosis.  I recommended the patient discontinue Celebrex and use Tylenol for pain.  He states that that will be sufficient.  He does not feel that he needs anything stronger.  He is appropriately anticoagulated on Eliquis and we plan to complete 3 months of anticoagulation.  He will be able to discontinue the Eliquis in mid November.  I will check a CMP to monitor his hypokalemia and transaminitis.  I will also check a CBC to monitor his thrombocytopenia and to evaluate for any evidence of anemia on his anticoagulant.  Recommended he receive the Covid vaccination 90 days after diagnosis.  I will complete a disability form.  I anticipate that the patient will be unable to work for the next 4 weeks.

## 2020-03-10 ENCOUNTER — Telehealth: Payer: Self-pay

## 2020-03-10 ENCOUNTER — Other Ambulatory Visit: Payer: Self-pay | Admitting: Family Medicine

## 2020-03-10 ENCOUNTER — Ambulatory Visit (HOSPITAL_COMMUNITY)
Admission: RE | Admit: 2020-03-10 | Discharge: 2020-03-10 | Disposition: A | Discharge status: Home / Self Care | Payer: BC Managed Care – PPO | Source: Ambulatory Visit | Attending: Family Medicine | Admitting: Family Medicine

## 2020-03-10 DIAGNOSIS — R0600 Dyspnea, unspecified: Secondary | ICD-10-CM | POA: Diagnosis not present

## 2020-03-10 DIAGNOSIS — J90 Pleural effusion, not elsewhere classified: Secondary | ICD-10-CM | POA: Diagnosis not present

## 2020-03-10 LAB — COMPLETE METABOLIC PANEL WITH GFR
AG Ratio: 1.4 (calc) (ref 1.0–2.5)
ALT: 55 U/L — ABNORMAL HIGH (ref 9–46)
AST: 25 U/L (ref 10–35)
Albumin: 3.3 g/dL — ABNORMAL LOW (ref 3.6–5.1)
Alkaline phosphatase (APISO): 75 U/L (ref 35–144)
BUN/Creatinine Ratio: 28 (calc) — ABNORMAL HIGH (ref 6–22)
BUN: 18 mg/dL (ref 7–25)
CO2: 26 mmol/L (ref 20–32)
Calcium: 9 mg/dL (ref 8.6–10.3)
Chloride: 102 mmol/L (ref 98–110)
Creat: 0.64 mg/dL — ABNORMAL LOW (ref 0.70–1.18)
GFR, Est African American: 114 mL/min/{1.73_m2} (ref >=60)
GFR, Est Non African American: 99 mL/min/{1.73_m2} (ref >=60)
Globulin: 2.4 g/dL (calc) (ref 1.9–3.7)
Glucose, Bld: 135 mg/dL — ABNORMAL HIGH (ref 65–99)
Potassium: 5.6 mmol/L — ABNORMAL HIGH (ref 3.5–5.3)
Sodium: 139 mmol/L (ref 135–146)
Total Bilirubin: 0.5 mg/dL (ref 0.2–1.2)
Total Protein: 5.7 g/dL — ABNORMAL LOW (ref 6.1–8.1)

## 2020-03-10 LAB — CBC WITH DIFFERENTIAL/PLATELET
Absolute Monocytes: 475 cells/uL (ref 200–950)
Basophils Absolute: 11 cells/uL (ref 0–200)
Basophils Relative: 0.1 %
Eosinophils Absolute: 22 cells/uL (ref 15–500)
Eosinophils Relative: 0.2 %
HCT: 45 % (ref 38.5–50.0)
Hemoglobin: 15.5 g/dL (ref 13.2–17.1)
Lymphs Abs: 670 cells/uL — ABNORMAL LOW (ref 850–3900)
MCH: 32 pg (ref 27.0–33.0)
MCHC: 34.4 g/dL (ref 32.0–36.0)
MCV: 93 fL (ref 80.0–100.0)
MPV: 9.8 fL (ref 7.5–12.5)
Monocytes Relative: 4.4 %
Neutro Abs: 9623 cells/uL — ABNORMAL HIGH (ref 1500–7800)
Neutrophils Relative %: 89.1 %
Platelets: 174 10*3/uL (ref 140–400)
RBC: 4.84 10*6/uL (ref 4.20–5.80)
RDW: 13.1 % (ref 11.0–15.0)
Total Lymphocyte: 6.2 %
WBC: 10.8 10*3/uL (ref 3.8–10.8)

## 2020-03-10 NOTE — Telephone Encounter (Signed)
Pt's wife called to report that pt is having chest discomfort, sob, and a lot of pain and she would like an Rx for pain that is not a narcotic. I advised pt's wife to take him to ER for evaluation due to his symptoms. She states she will monitor him a few more hours and if sxs worsen then she will take him. Please advise.

## 2020-03-10 NOTE — Telephone Encounter (Signed)
Pt's wife call back wanting a chest xray for pt and possibly prescribing an antibiotic.Marland Kitchen Please advise.

## 2020-03-10 NOTE — Telephone Encounter (Signed)
I ordered cxr.  If getting more sob with worsening pain he needs to go to er.

## 2020-03-10 NOTE — Telephone Encounter (Signed)
Pt notified and states he will go to [facility] this afternoon to get it.

## 2020-03-13 ENCOUNTER — Other Ambulatory Visit: Payer: Self-pay | Admitting: Family Medicine

## 2020-03-13 ENCOUNTER — Encounter: Payer: Self-pay | Admitting: Family Medicine

## 2020-03-13 ENCOUNTER — Telehealth: Payer: Self-pay | Admitting: Family Medicine

## 2020-03-13 DIAGNOSIS — J189 Pneumonia, unspecified organism: Secondary | ICD-10-CM

## 2020-03-13 MED ORDER — TRAMADOL HCL 50 MG PO TABS: 50.0000 mg | ORAL_TABLET | Freq: Three times a day (TID) | ORAL | 0 refills | Status: AC | PRN

## 2020-03-13 NOTE — Telephone Encounter (Signed)
CB# [phone] Laurey Arrow wife call about appt. to be schdule at [facility] office no appt.has been schedule

## 2020-03-14 ENCOUNTER — Telehealth: Payer: Self-pay | Admitting: Family Medicine

## 2020-03-14 ENCOUNTER — Encounter: Payer: Self-pay | Admitting: Family Medicine

## 2020-03-14 ENCOUNTER — Encounter (HOSPITAL_COMMUNITY): Payer: Self-pay | Admitting: Emergency Medicine

## 2020-03-14 ENCOUNTER — Emergency Department (HOSPITAL_COMMUNITY): Payer: BC Managed Care – PPO

## 2020-03-14 ENCOUNTER — Telehealth: Payer: Self-pay | Admitting: Pulmonary Disease

## 2020-03-14 ENCOUNTER — Inpatient Hospital Stay (HOSPITAL_COMMUNITY)
Admission: EM | Admit: 2020-03-14 | Discharge: 2020-03-22 | DRG: 177 | Disposition: A | Discharge status: Home Health | Payer: BC Managed Care – PPO | Attending: Internal Medicine | Admitting: Internal Medicine

## 2020-03-14 DIAGNOSIS — J9601 Acute respiratory failure with hypoxia: Secondary | ICD-10-CM | POA: Diagnosis not present

## 2020-03-14 DIAGNOSIS — Z9889 Other specified postprocedural states: Secondary | ICD-10-CM

## 2020-03-14 DIAGNOSIS — Z6833 Body mass index (BMI) 33.0-33.9, adult: Secondary | ICD-10-CM

## 2020-03-14 DIAGNOSIS — J942 Hemothorax: Secondary | ICD-10-CM | POA: Diagnosis not present

## 2020-03-14 DIAGNOSIS — J1282 Pneumonia due to coronavirus disease 2019: Secondary | ICD-10-CM | POA: Diagnosis present

## 2020-03-14 DIAGNOSIS — Z978 Presence of other specified devices: Secondary | ICD-10-CM | POA: Diagnosis not present

## 2020-03-14 DIAGNOSIS — J90 Pleural effusion, not elsewhere classified: Secondary | ICD-10-CM | POA: Diagnosis not present

## 2020-03-14 DIAGNOSIS — I10 Essential (primary) hypertension: Secondary | ICD-10-CM | POA: Diagnosis present

## 2020-03-14 DIAGNOSIS — J9690 Respiratory failure, unspecified, unspecified whether with hypoxia or hypercapnia: Secondary | ICD-10-CM

## 2020-03-14 DIAGNOSIS — J869 Pyothorax without fistula: Secondary | ICD-10-CM | POA: Diagnosis not present

## 2020-03-14 DIAGNOSIS — J470 Bronchiectasis with acute lower respiratory infection: Secondary | ICD-10-CM | POA: Diagnosis not present

## 2020-03-14 DIAGNOSIS — Z85828 Personal history of other malignant neoplasm of skin: Secondary | ICD-10-CM | POA: Diagnosis not present

## 2020-03-14 DIAGNOSIS — Z7901 Long term (current) use of anticoagulants: Secondary | ICD-10-CM | POA: Diagnosis not present

## 2020-03-14 DIAGNOSIS — E861 Hypovolemia: Secondary | ICD-10-CM | POA: Diagnosis present

## 2020-03-14 DIAGNOSIS — E669 Obesity, unspecified: Secondary | ICD-10-CM | POA: Diagnosis present

## 2020-03-14 DIAGNOSIS — J9691 Respiratory failure, unspecified with hypoxia: Secondary | ICD-10-CM

## 2020-03-14 DIAGNOSIS — F17290 Nicotine dependence, other tobacco product, uncomplicated: Secondary | ICD-10-CM | POA: Diagnosis not present

## 2020-03-14 DIAGNOSIS — I2510 Atherosclerotic heart disease of native coronary artery without angina pectoris: Secondary | ICD-10-CM | POA: Diagnosis not present

## 2020-03-14 DIAGNOSIS — R7401 Elevation of levels of liver transaminase levels: Secondary | ICD-10-CM | POA: Diagnosis present

## 2020-03-14 DIAGNOSIS — Y95 Nosocomial condition: Secondary | ICD-10-CM | POA: Diagnosis present

## 2020-03-14 DIAGNOSIS — J15211 Pneumonia due to Methicillin susceptible Staphylococcus aureus: Secondary | ICD-10-CM | POA: Diagnosis not present

## 2020-03-14 DIAGNOSIS — Z9689 Presence of other specified functional implants: Secondary | ICD-10-CM

## 2020-03-14 DIAGNOSIS — J9811 Atelectasis: Secondary | ICD-10-CM | POA: Diagnosis not present

## 2020-03-14 DIAGNOSIS — Z79899 Other long term (current) drug therapy: Secondary | ICD-10-CM

## 2020-03-14 DIAGNOSIS — R0602 Shortness of breath: Secondary | ICD-10-CM | POA: Diagnosis not present

## 2020-03-14 DIAGNOSIS — Z86718 Personal history of other venous thrombosis and embolism: Secondary | ICD-10-CM

## 2020-03-14 DIAGNOSIS — E871 Hypo-osmolality and hyponatremia: Secondary | ICD-10-CM | POA: Diagnosis not present

## 2020-03-14 DIAGNOSIS — R091 Pleurisy: Secondary | ICD-10-CM | POA: Diagnosis not present

## 2020-03-14 DIAGNOSIS — L8990 Pressure ulcer of unspecified site, unspecified stage: Secondary | ICD-10-CM | POA: Insufficient documentation

## 2020-03-14 DIAGNOSIS — Z20822 Contact with and (suspected) exposure to covid-19: Secondary | ICD-10-CM | POA: Diagnosis not present

## 2020-03-14 DIAGNOSIS — R918 Other nonspecific abnormal finding of lung field: Secondary | ICD-10-CM | POA: Diagnosis not present

## 2020-03-14 DIAGNOSIS — K589 Irritable bowel syndrome without diarrhea: Secondary | ICD-10-CM | POA: Diagnosis present

## 2020-03-14 DIAGNOSIS — Z4682 Encounter for fitting and adjustment of non-vascular catheter: Secondary | ICD-10-CM | POA: Diagnosis not present

## 2020-03-14 DIAGNOSIS — J439 Emphysema, unspecified: Secondary | ICD-10-CM | POA: Diagnosis not present

## 2020-03-14 DIAGNOSIS — J479 Bronchiectasis, uncomplicated: Secondary | ICD-10-CM | POA: Diagnosis not present

## 2020-03-14 DIAGNOSIS — J9621 Acute and chronic respiratory failure with hypoxia: Secondary | ICD-10-CM

## 2020-03-14 DIAGNOSIS — B948 Sequelae of other specified infectious and parasitic diseases: Secondary | ICD-10-CM | POA: Diagnosis not present

## 2020-03-14 DIAGNOSIS — I82409 Acute embolism and thrombosis of unspecified deep veins of unspecified lower extremity: Secondary | ICD-10-CM

## 2020-03-14 DIAGNOSIS — D696 Thrombocytopenia, unspecified: Secondary | ICD-10-CM | POA: Diagnosis present

## 2020-03-14 DIAGNOSIS — J189 Pneumonia, unspecified organism: Secondary | ICD-10-CM | POA: Diagnosis not present

## 2020-03-14 DIAGNOSIS — Z885 Allergy status to narcotic agent status: Secondary | ICD-10-CM

## 2020-03-14 DIAGNOSIS — Z66 Do not resuscitate: Secondary | ICD-10-CM | POA: Diagnosis present

## 2020-03-14 DIAGNOSIS — L89302 Pressure ulcer of unspecified buttock, stage 2: Secondary | ICD-10-CM | POA: Diagnosis not present

## 2020-03-14 DIAGNOSIS — E875 Hyperkalemia: Secondary | ICD-10-CM | POA: Diagnosis present

## 2020-03-14 DIAGNOSIS — I82451 Acute embolism and thrombosis of right peroneal vein: Secondary | ICD-10-CM | POA: Diagnosis not present

## 2020-03-14 DIAGNOSIS — U071 COVID-19: Secondary | ICD-10-CM | POA: Diagnosis present

## 2020-03-14 DIAGNOSIS — R05 Cough: Secondary | ICD-10-CM | POA: Diagnosis not present

## 2020-03-14 DIAGNOSIS — Z8616 Personal history of COVID-19: Secondary | ICD-10-CM | POA: Diagnosis not present

## 2020-03-14 DIAGNOSIS — I517 Cardiomegaly: Secondary | ICD-10-CM | POA: Diagnosis not present

## 2020-03-14 DIAGNOSIS — J939 Pneumothorax, unspecified: Secondary | ICD-10-CM | POA: Diagnosis not present

## 2020-03-14 HISTORY — DX: COVID-19: U07.1

## 2020-03-14 LAB — COMPREHENSIVE METABOLIC PANEL
ALT: 84 U/L — ABNORMAL HIGH (ref 0–44)
AST: 66 U/L — ABNORMAL HIGH (ref 15–41)
Albumin: 2.7 g/dL — ABNORMAL LOW (ref 3.5–5.0)
Alkaline Phosphatase: 123 U/L (ref 38–126)
Anion gap: 9 (ref 5–15)
BUN: 11 mg/dL (ref 8–23)
CO2: 27 mmol/L (ref 22–32)
Calcium: 8.5 mg/dL — ABNORMAL LOW (ref 8.9–10.3)
Chloride: 97 mmol/L — ABNORMAL LOW (ref 98–111)
Creatinine, Ser: 0.59 mg/dL — ABNORMAL LOW (ref 0.61–1.24)
GFR calc Af Amer: >60 mL/min (ref >60)
GFR calc non Af Amer: >60 mL/min (ref >60)
Glucose, Bld: 143 mg/dL — ABNORMAL HIGH (ref 70–99)
Potassium: 4.2 mmol/L (ref 3.5–5.1)
Sodium: 133 mmol/L — ABNORMAL LOW (ref 135–145)
Total Bilirubin: 0.7 mg/dL (ref 0.3–1.2)
Total Protein: 6.5 g/dL (ref 6.5–8.1)

## 2020-03-14 LAB — CBC WITH DIFFERENTIAL/PLATELET
Abs Immature Granulocytes: 0.12 10*3/uL — ABNORMAL HIGH (ref 0.00–0.07)
Basophils Absolute: 0 10*3/uL (ref 0.0–0.1)
Basophils Relative: 0 %
Eosinophils Absolute: 0 10*3/uL (ref 0.0–0.5)
Eosinophils Relative: 0 %
HCT: 42 % (ref 39.0–52.0)
Hemoglobin: 13.9 g/dL (ref 13.0–17.0)
Immature Granulocytes: 1 %
Lymphocytes Relative: 4 %
Lymphs Abs: 0.5 10*3/uL — ABNORMAL LOW (ref 0.7–4.0)
MCH: 31.9 pg (ref 26.0–34.0)
MCHC: 33.1 g/dL (ref 30.0–36.0)
MCV: 96.3 fL (ref 80.0–100.0)
Monocytes Absolute: 0.7 10*3/uL (ref 0.1–1.0)
Monocytes Relative: 5 %
Neutro Abs: 11.4 10*3/uL — ABNORMAL HIGH (ref 1.7–7.7)
Neutrophils Relative %: 90 %
Platelets: 225 10*3/uL (ref 150–400)
RBC: 4.36 MIL/uL (ref 4.22–5.81)
RDW: 14.5 % (ref 11.5–15.5)
WBC: 12.7 10*3/uL — ABNORMAL HIGH (ref 4.0–10.5)
nRBC: 0 % (ref 0.0–0.2)

## 2020-03-14 LAB — BODY FLUID CELL COUNT WITH DIFFERENTIAL
Eos, Fluid: 0 %
Lymphs, Fluid: 6 %
Monocyte-Macrophage-Serous Fluid: 10 % — ABNORMAL LOW (ref 50–90)
Neutrophil Count, Fluid: 84 % — ABNORMAL HIGH (ref 0–25)
Total Nucleated Cell Count, Fluid: >10000 cu mm — ABNORMAL HIGH (ref 0–1000)

## 2020-03-14 LAB — SARS CORONAVIRUS 2 BY RT PCR (HOSPITAL ORDER, PERFORMED IN ~~LOC~~ HOSPITAL LAB): SARS Coronavirus 2: NEGATIVE

## 2020-03-14 LAB — GRAM STAIN

## 2020-03-14 LAB — PROTEIN, PLEURAL OR PERITONEAL FLUID: Total protein, fluid: 3.9 g/dL

## 2020-03-14 LAB — LACTATE DEHYDROGENASE, PLEURAL OR PERITONEAL FLUID: LD, Fluid: 2777 U/L — ABNORMAL HIGH (ref 3–23)

## 2020-03-14 LAB — BRAIN NATRIURETIC PEPTIDE: B Natriuretic Peptide: 160 pg/mL — ABNORMAL HIGH (ref 0.0–100.0)

## 2020-03-14 LAB — GLUCOSE, PLEURAL OR PERITONEAL FLUID: Glucose, Fluid: <20 mg/dL

## 2020-03-14 MED ORDER — VANCOMYCIN HCL 2000 MG/400ML IV SOLN
2000.0000 mg | Freq: Once | INTRAVENOUS | Status: DC
  Filled 2020-03-14: qty 400

## 2020-03-14 MED ORDER — SODIUM CHLORIDE 0.9 % IV SOLN
2.0000 g | Freq: Three times a day (TID) | INTRAVENOUS | Status: DC
  Administered 2020-03-14 – 2020-03-17 (×8): 2 g via INTRAVENOUS
  Filled 2020-03-14 (×11): qty 2

## 2020-03-14 MED ORDER — HYDROMORPHONE HCL 1 MG/ML IJ SOLN
0.5000 mg | INTRAMUSCULAR | Status: DC | PRN
  Administered 2020-03-14: 0.5 mg via INTRAVENOUS
  Filled 2020-03-14: qty 1

## 2020-03-14 MED ORDER — ACETAMINOPHEN 650 MG RE SUPP: 650.0000 mg | Freq: Four times a day (QID) | RECTAL | Status: DC | PRN

## 2020-03-14 MED ORDER — VANCOMYCIN HCL 1750 MG/350ML IV SOLN
1750.0000 mg | INTRAVENOUS | Status: DC
  Administered 2020-03-15 – 2020-03-17 (×3): 1750 mg via INTRAVENOUS
  Filled 2020-03-14 (×6): qty 350

## 2020-03-14 MED ORDER — ACETAMINOPHEN 325 MG PO TABS
650.0000 mg | ORAL_TABLET | Freq: Four times a day (QID) | ORAL | Status: DC | PRN
  Administered 2020-03-15 – 2020-03-18 (×5): 650 mg via ORAL
  Filled 2020-03-14 (×5): qty 2

## 2020-03-14 MED ORDER — HYDROMORPHONE HCL 1 MG/ML IJ SOLN
0.5000 mg | Freq: Once | INTRAMUSCULAR | Status: AC
  Administered 2020-03-14: 0.5 mg via INTRAVENOUS
  Filled 2020-03-14: qty 1

## 2020-03-14 MED ORDER — ONDANSETRON HCL 4 MG/2ML IJ SOLN
4.0000 mg | Freq: Once | INTRAMUSCULAR | Status: AC
  Administered 2020-03-14: 4 mg via INTRAVENOUS
  Filled 2020-03-14: qty 2

## 2020-03-14 MED ORDER — IOHEXOL 350 MG/ML SOLN
100.0000 mL | Freq: Once | INTRAVENOUS | Status: AC | PRN
  Administered 2020-03-14: 100 mL via INTRAVENOUS

## 2020-03-14 MED ORDER — LIDOCAINE 5 % EX OINT
TOPICAL_OINTMENT | Freq: Four times a day (QID) | CUTANEOUS | Status: DC | PRN
  Filled 2020-03-14: qty 35.44

## 2020-03-14 MED ORDER — PREDNISONE 20 MG PO TABS
20.0000 mg | ORAL_TABLET | Freq: Every day | ORAL | Status: DC
  Administered 2020-03-15 – 2020-03-17 (×3): 20 mg via ORAL
  Filled 2020-03-14 (×3): qty 1

## 2020-03-14 MED ORDER — POLYETHYLENE GLYCOL 3350 17 G PO PACK
17.0000 g | PACK | Freq: Every day | ORAL | Status: DC | PRN
  Administered 2020-03-16: 17 g via ORAL
  Filled 2020-03-14 (×2): qty 1

## 2020-03-14 MED ORDER — DIBUCAINE (PERIANAL) 1 % EX OINT: TOPICAL_OINTMENT | CUTANEOUS | Status: DC | PRN

## 2020-03-14 MED ORDER — ONDANSETRON HCL 4 MG/2ML IJ SOLN: 4.0000 mg | Freq: Four times a day (QID) | INTRAMUSCULAR | Status: DC | PRN

## 2020-03-14 MED ORDER — HYDROMORPHONE HCL 1 MG/ML IJ SOLN
0.5000 mg | INTRAMUSCULAR | Status: DC | PRN
  Administered 2020-03-16 – 2020-03-21 (×9): 0.5 mg via INTRAVENOUS
  Filled 2020-03-14 (×9): qty 1

## 2020-03-14 MED ORDER — VANCOMYCIN HCL 2000 MG/400ML IV SOLN
2000.0000 mg | Freq: Once | INTRAVENOUS | Status: AC
  Administered 2020-03-14: 2000 mg via INTRAVENOUS
  Filled 2020-03-14: qty 400

## 2020-03-14 MED ORDER — ONDANSETRON HCL 4 MG PO TABS: 4.0000 mg | ORAL_TABLET | Freq: Four times a day (QID) | ORAL | Status: DC | PRN

## 2020-03-14 MED ORDER — HEPARIN (PORCINE) 25000 UT/250ML-% IV SOLN
2050.0000 [IU]/h | INTRAVENOUS | Status: DC
  Administered 2020-03-14: 1600 [IU]/h via INTRAVENOUS
  Administered 2020-03-15 – 2020-03-18 (×4): 1800 [IU]/h via INTRAVENOUS
  Administered 2020-03-19 – 2020-03-20 (×2): 1850 [IU]/h via INTRAVENOUS
  Administered 2020-03-20: 1950 [IU]/h via INTRAVENOUS
  Administered 2020-03-21 – 2020-03-22 (×2): 2050 [IU]/h via INTRAVENOUS
  Filled 2020-03-14 (×13): qty 250

## 2020-03-14 NOTE — Progress Notes (Addendum)
ANTICOAGULATION CONSULT NOTE - Initial Consult  Pharmacy Consult for Heparin (Eliquis on hold) Indication: recent DVT  Allergies  Allergen Reactions  . Morphine     Other reaction(s): VOMITING    Patient Measurements: Height: 6\' 1"  (185.4 cm) Weight: 111.1 kg (245 lb) IBW/kg (Calculated) : 79.9 Heparin Dosing Weight: 105 kg  Vital Signs: Temp: 98.6 F (37 C) (08/24 1225) Temp Source: Oral (08/24 1225) BP: 130/76 (08/24 1456) Pulse Rate: 89 (08/24 2045)  Labs: Recent Labs    03/14/20 1247  HGB 13.9  HCT 42.0  PLT 225  CREATININE 0.59*    Estimated Creatinine Clearance: 110.7 mL/min (A) (by C-G formula based on SCr of 0.59 mg/dL (L)).   Medical History: Past Medical History:  Diagnosis Date  . Cataract 2014   removed  . COVID-19   . DDD (degenerative disc disease), lumbar   . Diverticulosis   . Headache   . Hypertension   . IBS (irritable bowel syndrome)     Medications:  See electronic med rec  Assessment: 69 y.o. M presents with SOB, CP. Pt on apixaban PTA for recent DVT. Last dose 8/24 0830. To transition to heparin gtt for possible procedures for empyema - plan to transfer to [facility] for evaluation by CVTS. Apixaban likely affecting heparin level so will utilize PTT for monitoring until levels correlate. CBC ok on admission.  Baseline heparin level 0.88 (apixaban affecting), baseline PTT 42 sec  Goal of Therapy:  Heparin level 0.3-0.7 units/ml; PTT 66-102 sec Monitor platelets by anticoagulation protocol: Yes   Plan:  Baseline PTT and heparin level Heparin gtt at 1600 units/hr Will f/u PTT in 8 hours Daily heparin level, PTT, and CBC  Sherlon Handing, PharmD, BCPS Please see amion for complete clinical pharmacist phone list 03/14/2020,11:21 PM

## 2020-03-14 NOTE — Progress Notes (Signed)
Pharmacy Antibiotic Note  Ryan Turner is a 69 y.o. male admitted on 03/14/2020 with pneumonia.  Pharmacy has been consulted for Vancomycin and Cefepime dosing.  Plan: Vancomycin 2000 mg IV x 1 dose. Vancomycin 1750 mg IV every 24 hours. Expected AUC 420. Cefepime 2000 mg IV every 8 hours. Monitor labs, c/s, and vanco level as indicated.  Height: 6\' 1"  (185.4 cm) Weight: 111.1 kg (245 lb) IBW/kg (Calculated) : 79.9  Temp (24hrs), Avg:98.6 F (37 C), Min:98.6 F (37 C), Max:98.6 F (37 C)  Recent Labs  Lab 03/09/20 1659 03/14/20 1247  WBC 10.8 12.7*  CREATININE 0.64* 0.59*    Estimated Creatinine Clearance: 110.7 mL/min (A) (by C-G formula based on SCr of 0.59 mg/dL (L)).    Allergies  Allergen Reactions  . Morphine     Other reaction(s): VOMITING    Antimicrobials this admission: Vanco 8/24 >>  Cefepime 8/24 >>    Microbiology results: 8/24 MRSA PCR: pending  Thank you for allowing pharmacy to be a part of this patient's care.  Ramond Craver 03/14/2020 4:55 PM

## 2020-03-14 NOTE — Consult Note (Addendum)
NAME:  Ryan Turner, MRN:  [id number], DOB:  11-16-1950, LOS: 0 ADMISSION DATE:  03/14/2020, CONSULTATION DATE:  03/14/20  REFERRING MD:  EDP, CHIEF COMPLAINT:  Sob/L hemothorax   Brief History   69 yowm active cigar smoker from [place] s/p covid 19 pna and steady improvement in activity tol p d/c on 2lpm rest/ 4lpm with activity and steroid taper then 3 d PTA with new sob/ R pleuritic cp and orthopnea > ED am 8/24 [facility] with ? Tension hydrothorax > emergent T centesis by IR yielded only 400 cc blood and pccm asked to evaluate with ddx = staph empyema.  History of present illness   Date of Admission: 02/20/2020 Date of Discharge: 03/03/2020  Attending Physician:Jeffrey Hennie Duos, MD  Patient's ENI:[id number], [name], MD  Consults: none   Disposition: D/C home   Date of Positive COVID Test: 02/14/20  Date Isolation Ends: 03/03/20  COVID-19 specific Treatment: Steroid 8/1 > slow taper over 3+ weeks post discharge  Remdesivir 8/1 > 8/5 Actemra 8/2  Tests Needing Follow-up: -pt will need refill for Eliquis to complete full 3 months of tx for his acute L DVT -assess O2 sats at rest and w/ ambulation to determine if weaning of O2 possible -f/u BMET to assess K+ and renal fxn   Discharge Diagnoses: COVID Pneumonia Acute hypoxic respiratory failure Acute Left lower extremity DVT Thrombocytopenia Transaminitis Mild hyperkalemia HTN Mild hyponatremia Obesity -Body mass index is 33.39 kg/m.  Initial presentation: 69 year old with a history of HTN and IBS who was diagnosed with Covid 7/26 and presented to the ED 8/1 with worsening shortness of breath. He was found to be acutely profoundly hypoxic with Covid pneumonia. His hospital course was complicated by severe persistent hypoxia requiring prolonged heated high flow nasal cannula support. He was also confirmed to have a left lower extremity DVT.  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 -serial chest x-rays without change -completed course of remdesivir -no evidence of volume overload with significant negative balance at time of d/c -steroids being tapered very slowly given persistent hypoxia -titrate oxygen as able - home O2 arranged (3L at rest - up to 6L w/ exertion)  Acute Left lower extremity DVT Felt to be provoked by Covid - Lovenox transitioned to Eliquis-we will plan for 3 months of Eliquis  Thrombocytopenia Mild and stable at time of d/c   Transaminitis Due to Covid and treatment of same -essentially resolved at time of d/c   Mild hyperkalemia Corrected with use of Lokelma which was stopped prior to d/c   HTN patient states he does not take beta-blocker at home therefore I am stopped propranololwhich was initiated as an inpatient - gently hydrated prior to d/c - BP stable   Mild hyponatremia Likely due to hypovolemia- corrected w/ hydration   Obesity -Body mass index is 33.39 kg/m.   Past Medical History   HBP  Significant Hospital Events     Consults:  PCCM 8/24   Procedures:  R Thoracentesis 8/24  = 400 cc blood with glucose < 20 and LDH 2,777 and wbc > 10k with mostly polys  Significant Diagnostic Tests:  Venous dopplers 02/22/20 Findings consistent with acute deep vein thrombosis involving the left posterior tibial veins, and left peroneal veins  Venous doppler L side 8/24 >>>  Micro Data:  Covid 19  8/24  Neg  Pleural fluid 8/24 >>> gpc's >>>   Antimicrobials:  maxepime 8/24 >>>  vanc  8/24 >>>   Scheduled Meds: Continuous Infusions: PRN Meds:.HYDROmorphone (DILAUDID) injection, lidocaine   Interim history/subjective:  Bette p 400 cc thick blood removed R chest thispm   Objective   Blood pressure 130/76, pulse (!) 106, temperature 98.6 F (37 C), temperature source Oral, resp. rate (!) 27, height 6\' 1"  (1.854 m), weight 111.1 kg, SpO2 93 %.       No intake or output  data in the 24 hours ending 03/14/20 1639 Filed Weights   03/14/20 1225  Weight: 111.1 kg    Examination: Tmax 98.6  General: pleasant wm comfortable on 5lpm with sats mid 90s HENT: teetch ok  Lungs: decreased bs R base/ ?pleural rub Cardiovascular: RRR no s3  Abdomen: soft/benign Extremities: warrm,no calf tenderness Neuro: intact sensorium/ no def   Resolved Hospital Problem list     Assessment & Plan:   1)  R hemothorax while on eliquis for L DVT with very low glucose suggesting likely     Empyema is the underlying problem with lots of loculations post covid 19 pna ? S/p  Superinfection with MRSA?  - repeat venous dopplers pending - CTa pending > discussed with Abigail PA > contact t surgery for curbside once CTa available and hold eliquis in meantime  - abx per edp approp for now   2) Acute on chronic hypoxemic resp failure seconary to #1 and FP phase of ALI from covid Pna  - rx  IS / 02 to sats > 90%   Best practice:  Diet: rgular  Pain/Anxiety/Delirium protocol (if indicated): n/a VAP protocol (if indicated): n/a DVT prophylaxis: sub q hep  GI prophylaxis: n/a Glucose control: n/a Mobility: up as tol Code Status: full code Family Communication: wife on speaker phone pm 8/24/ medical technologist Disposition: needs to be admitted but  ? Transfer directly to [facility] from ER or amit to [facility]  for T surgery vs Pluerex/ lytics   Labs   CBC: Recent Labs  Lab 03/09/20 1659 03/14/20 1247  WBC 10.8 12.7*  NEUTROABS 9,623* 11.4*  HGB 15.5 13.9  HCT 45.0 42.0  MCV 93.0 96.3  PLT 174 175    Basic Metabolic Panel: Recent Labs  Lab 03/09/20 1659 03/14/20 1247  NA 139 133*  K 5.6* 4.2  CL 102 97*  CO2 26 27  GLUCOSE 135* 143*  BUN 18 11  CREATININE 0.64* 0.59*  CALCIUM 9.0 8.5*   GFR: Estimated Creatinine Clearance: 110.7 mL/min (A) (by C-G formula based on SCr of 0.59 mg/dL (L)). Recent Labs  Lab 03/09/20 1659 03/14/20 1247  WBC 10.8 12.7*    Liver  Function Tests: Recent Labs  Lab 03/09/20 1659 03/14/20 1247  AST 25 66*  ALT 55* 84*  ALKPHOS  --  123  BILITOT 0.5 0.7  PROT 5.7* 6.5  ALBUMIN  --  2.7*   No results for input(s): LIPASE, AMYLASE in the last 168 hours. No results for input(s): AMMONIA in the last 168 hours.  ABG    Component Value Date/Time   PHART 7.480 (H) 02/21/2020 0532   PCO2ART 42.3 02/21/2020 0532   PO2ART 46 (L) 02/21/2020 0532   HCO3 31.4 (H) 02/21/2020 0532   TCO2 33 (H) 02/21/2020 0532   O2SAT 84.0 02/21/2020 0532     Coagulation Profile: No results for input(s): INR, PROTIME in the last 168 hours.  Cardiac Enzymes: No results for input(s): CKTOTAL, CKMB, CKMBINDEX, TROPONINI in the last 168 hours.  HbA1C: No results found for: HGBA1C  CBG: No results for input(s): GLUCAP in the last 168 hours.     Past Medical History  He,  has a past medical history of Cataract (2014), COVID-19, DDD (degenerative disc disease), lumbar, Diverticulosis, Headache, Hypertension, and IBS (irritable bowel syndrome).   Surgical History    Past Surgical History:  Procedure Laterality Date   cataracts     COLON SURGERY  1995   fistula   COLONOSCOPY     EYE SURGERY  2014   catarats/lens replaced   mohs  2015   skin cancer from nose   [place] History   reports that he has been smoking cigars. He has never used smokeless tobacco. He reports current alcohol use of about 6.0 standard drinks of alcohol per week. He reports that he does not use drugs.   Family History   His family history includes Cancer in his brother, father, and mother; Leukemia in his maternal grandfather; Stomach cancer in his paternal grandmother. There is no history of Colon cancer, Colon polyps, Diabetes, Esophageal cancer, or Rectal cancer.   Allergies Allergies  Allergen Reactions   Morphine     Other reaction(s): VOMITING     Home Medications  Prior to Admission medications     Medication Sig Start Date End Date Taking? Authorizing Provider  acetaminophen (TYLENOL) 325 MG tablet Take 2 tablets (650 mg total) by mouth every 6 (six) hours as needed for mild pain or headache (fever >/= 101). 03/03/20  Yes Cherene Altes, MD  apixaban (ELIQUIS) 5 MG TABS tablet Take 1 tablet (5 mg total) by mouth 2 (two) times daily. 04/08/20  Yes Cherene Altes, MD  celecoxib (CELEBREX) 200 MG capsule TAKE 1 CAPSULE (200 MG TOTAL) BY MOUTH AS NEEDED. Patient taking differently: Take 200 mg by mouth daily as needed for mild pain.  01/04/19  Yes [name], MD  docusate sodium (COLACE) 100 MG capsule Take 200 mg by mouth daily as needed for mild constipation or moderate constipation.    Yes [provider]  Glucosamine-Chondroit-Vit C-Mn (GLUCOSAMINE CHONDROITIN COMPLX) CAPS Take 2 capsules by mouth daily. 1800 mg   Yes [provider]  Multiple Vitamins-Minerals (OCUVITE PRESERVISION PO) Take 2 tablets by mouth daily.    Yes [provider]  Omega-3 Fatty Acids (FISH OIL) 1200 MG CAPS Take 2 capsules by mouth daily.   Yes [provider]  predniSONE (DELTASONE) 20 MG tablet Take 1 tablet (20 mg total) by mouth daily with breakfast for 7 days. 03/15/20 03/22/20 Yes Cherene Altes, MD  traMADol (ULTRAM) 50 MG tablet Take 1 tablet (50 mg total) by mouth every 8 (eight) hours as needed for up to 10 days. 03/13/20 03/23/20 Yes [name], MD  zolpidem (AMBIEN) 10 MG tablet Take 0.5-1 tablets (5-10 mg total) by mouth at bedtime as needed. for sleep 11/04/19  Yes [name], MD  clotrimazole-betamethasone (LOTRISONE) cream Apply 1 application topically 2 (two) times daily. Patient not taking: Reported on 03/14/2020 03/09/20   [name], MD  fluticasone [facility]) 50 MCG/ACT nasal spray Place into both nostrils as needed for allergies or rhinitis. Patient not taking: Reported on 03/09/2020    [provider]  predniSONE  (DELTASONE) 10 MG tablet Take 3 tablets (30 mg total) by mouth daily with breakfast for 7 days. Patient not taking: Reported on 03/14/2020 03/08/20 03/15/20  Cherene Altes, MD  predniSONE (DELTASONE) 10  MG tablet Take 1 tablet (10 mg total) by mouth daily with breakfast for 7 days. Patient not taking: Reported on 03/09/2020 03/22/20 03/29/20  Cherene Altes, MD  sildenafil (VIAGRA) 100 MG tablet Take 0.5-1 tablets (50-100 mg total) by mouth daily as needed for erectile dysfunction. Patient not taking: Reported on 03/09/2020 07/14/19   [name], MD         The patient is critically ill with multiple organ systems failure and requires high complexity decision making for assessment and support, frequent evaluation and titration of therapies, application of advanced monitoring technologies and extensive interpretation of multiple databases. Critical Care Time devoted to patient care services described in this note is 45 minutes.    Christinia Gully, MD Pulmonary and [place] [phone]   After 7:00 pm call Elink  [phone]

## 2020-03-14 NOTE — ED Provider Notes (Addendum)
[facility] Provider Note   CSN: [id number] Arrival date & time: 03/14/20  1216     History Chief Complaint  Patient presents with  . Shortness of Breath    Ryan Turner is a 69 y.o. male who presents to the emergency department with chief complaint of shortness of breath.  The patient was recently discharged from prolonged hospital admission for Covid pneumonia with hypoxic respiratory failure with suspicion for fibrotic stages of ARDS.  Patient was diagnosed with Covid 19 on 726 and presented to the emergency department on 8 1 with worsening shortness of breath.  He was profoundly hypoxic requiring high flow nasal cannula and also confirmed to have a DVT of the left lower extremity.  Since that time the patient has had persistent hypoxia requiring oxygen via nasal cannula.  He is able to maintain fairly normal oxygen saturations on 3 L at rest but requires up to 3 L with any exertion.  The patient has been compliant with his Eliquis and is also taking medication for pain at this time.  The patient states that about 3 days ago he began having pleuritic right sided chest wall pain which she describes as "feeling like I may have broken my rib."  He is unable to take any deep breaths.  Yesterday he states that his breathing became far worse.  This morning he had physical therapy coming by to see him and had to walk to go open his garage door.  He had his oxygen turned up appropriately however by the time he got to the door his oxygen had desaturated to 68%.  He states that it "took everything in me to get back into the house."  He does have a known large right-sided pleural effusion.  He denies any fevers or chills.  He endorses severe fatigue and malaise which have been fairly persistent since he was hospitalized.  He denies abdominal pain, nausea, vomiting, urinary symptoms.  HPI     Past Medical History:  Diagnosis Date  . Cataract 2014   removed  . COVID-19   . DDD  (degenerative disc disease), lumbar   . Diverticulosis   . Headache   . Hypertension   . IBS (irritable bowel syndrome)     Patient Active Problem List   Diagnosis Date Noted  . Irritable bowel syndrome 02/21/2020  . Acute respiratory failure with hypoxia ([place]) 02/21/2020  . Pneumonia due to COVID-19 virus 02/20/2020  . Persistent headaches 05/15/2017  . DDD (degenerative disc disease), lumbar   . Diverticulosis   . Cataract   . Hypertension     Past Surgical History:  Procedure Laterality Date  . cataracts    . COLON SURGERY  1995   fistula  . COLONOSCOPY    . EYE SURGERY  2014   catarats/lens replaced  . mohs  2015   skin cancer from nose  . POLYPECTOMY    . VASECTOMY  1985       Family History  Problem Relation Age of Onset  . Cancer Mother        lung (unknown primary)  . Cancer Father        lung mets to spine  . Cancer Brother        melanoma  . Leukemia Maternal Grandfather   . Stomach cancer Paternal Grandmother   . Colon cancer Neg Hx   . Colon polyps Neg Hx   . Diabetes Neg Hx   . Esophageal cancer Neg Hx   .  Rectal cancer Neg Hx     Social History   Tobacco Use  . Smoking status: Current Some Day Smoker    Types: Cigars  . Smokeless tobacco: Never Used  . Tobacco comment: quit cigarettes in 1973  Vaping Use  . Vaping Use: Never used  Substance Use Topics  . Alcohol use: Yes    Alcohol/week: 6.0 standard drinks    Types: 2 Cans of beer, 4 Shots of liquor per week  . Drug use: No    Home Medications Prior to Admission medications   Medication Sig Start Date End Date Taking? Authorizing Provider  acetaminophen (TYLENOL) 325 MG tablet Take 2 tablets (650 mg total) by mouth every 6 (six) hours as needed for mild pain or headache (fever >/= 101). Patient not taking: Reported on 03/09/2020 03/03/20   Cherene Altes, MD  apixaban (ELIQUIS) 5 MG TABS tablet Take 1 tablet (5 mg total) by mouth 2 (two) times daily. 04/08/20   Cherene Altes, MD  celecoxib (CELEBREX) 200 MG capsule TAKE 1 CAPSULE (200 MG TOTAL) BY MOUTH AS NEEDED. Patient taking differently: Take 200 mg by mouth daily as needed for mild pain.  01/04/19   [name], MD  clotrimazole-betamethasone (LOTRISONE) cream Apply 1 application topically 2 (two) times daily. 03/09/20   [name], MD  docusate sodium (COLACE) 100 MG capsule Take 200 mg by mouth daily as needed for mild constipation or moderate constipation.     [provider]  fluticasone (FLONASE) 50 MCG/ACT nasal spray Place into both nostrils as needed for allergies or rhinitis. Patient not taking: Reported on 03/09/2020    [provider]  Glucosamine-Chondroit-Vit C-Mn (GLUCOSAMINE CHONDROITIN COMPLX) CAPS Take 2 capsules by mouth daily. 1800 mg    [provider]  Multiple Vitamins-Minerals (OCUVITE PRESERVISION PO) Take 2 tablets by mouth daily.     [provider]  Omega-3 Fatty Acids (FISH OIL) 1200 MG CAPS Take 2 capsules by mouth daily.    [provider]  predniSONE (DELTASONE) 10 MG tablet Take 3 tablets (30 mg total) by mouth daily with breakfast for 7 days. 03/08/20 03/15/20  Cherene Altes, MD  predniSONE (DELTASONE) 10 MG tablet Take 1 tablet (10 mg total) by mouth daily with breakfast for 7 days. Patient not taking: Reported on 03/09/2020 03/22/20 03/29/20  Cherene Altes, MD  predniSONE (DELTASONE) 20 MG tablet Take 1 tablet (20 mg total) by mouth daily with breakfast for 7 days. 03/15/20 03/22/20  Cherene Altes, MD  sildenafil (VIAGRA) 100 MG tablet Take 0.5-1 tablets (50-100 mg total) by mouth daily as needed for erectile dysfunction. Patient not taking: Reported on 03/09/2020 07/14/19   [name], MD  traMADol (ULTRAM) 50 MG tablet Take 1 tablet (50 mg total) by mouth every 8 (eight) hours as needed for up to 10 days. 03/13/20 03/23/20  [name], MD  zolpidem (AMBIEN) 10 MG tablet Take 0.5-1 tablets (5-10 mg total) by  mouth at bedtime as needed. for sleep 11/04/19   [name], MD    Allergies    Morphine  Review of Systems   Review of Systems   Ten systems reviewed and are negative for acute change, except as noted in the HPI.   Physical Exam Updated Vital Signs BP 136/80 (BP Location: Right Arm)   Pulse (!) 111   Temp 98.6 F (37 C) (Oral)   Resp (!) 22   Ht 6\' 1"  (1.854 m)  Wt 111.1 kg   SpO2 94% Comment: 4L [place]  Simultaneous filing. User may not have seen previous data.  BMI 32.32 kg/m   Physical Exam Vitals and nursing note reviewed.  Constitutional:      General: He is not in acute distress.    Appearance: He is well-developed. He is ill-appearing. He is not diaphoretic.  HENT:     Head: Normocephalic and atraumatic.  Eyes:     General: No scleral icterus.    Conjunctiva/sclera: Conjunctivae normal.  Cardiovascular:     Rate and Rhythm: Normal rate and regular rhythm.     Heart sounds: Normal heart sounds.  Pulmonary:     Effort: Respiratory distress present.     Breath sounds: Examination of the right-upper field reveals decreased breath sounds. Examination of the right-middle field reveals decreased breath sounds. Examination of the right-lower field reveals decreased breath sounds. Decreased breath sounds, rhonchi and rales present.     Comments: Patient speaking in 3 word sentences,at which point he must stop to catch his breath. He is extremely winded after 3 words and breaths as if he has been sprinting Abdominal:     Palpations: Abdomen is soft.     Tenderness: There is no abdominal tenderness.  Musculoskeletal:     Cervical back: Normal range of motion and neck supple.  Skin:    General: Skin is warm and dry.       Neurological:     Mental Status: He is alert.  Psychiatric:        Behavior: Behavior normal.     ED Results / Procedures / Treatments   Labs (all labs ordered are listed, but only abnormal results are displayed) Labs Reviewed  SARS  CORONAVIRUS 2 BY RT PCR (HOSPITAL ORDER, [place] [facility])  BRAIN NATRIURETIC PEPTIDE  CBC WITH DIFFERENTIAL/PLATELET  COMPREHENSIVE METABOLIC PANEL    EKG None  Radiology No results found.  Procedures .Critical Care Performed by: Margarita Mail, PA-C Authorized by: Margarita Mail, PA-C   Critical care provider statement:    Critical care time (minutes):  90   Critical care time was exclusive of:  Separately billable procedures and treating other patients   Critical care was necessary to treat or prevent imminent or life-threatening deterioration of the following conditions:  Respiratory failure   Critical care was time spent personally by me on the following activities:  Discussions with consultants, evaluation of patient's response to treatment, examination of patient, ordering and performing treatments and interventions, ordering and review of laboratory studies, ordering and review of radiographic studies, pulse oximetry, re-evaluation of patient's condition, obtaining history from patient or surrogate and review of old charts   (including critical care time)  Medications Ordered in ED Medications  HYDROmorphone (DILAUDID) injection 0.5 mg (has no administration in time range)  ondansetron (ZOFRAN) injection 4 mg (has no administration in time range)    ED Course  I have reviewed the triage vital signs and the nursing notes.  Pertinent labs & imaging results that were available during my care of the patient were reviewed by me and considered in my medical decision making (see chart for details).  Clinical Course as of Mar 14 1605  Tue Mar 14, 2020  1423 I spoke with Dr. Melvyn Novas of PCCM- he will consult on the patient, He and I reviewed images and he is concerned that the pleural effusion is causing  tracheal deviation. I discussed this with Dr. Thornton Papas who will do an US  guided thoracentesis. He is aware that patient is on Eliquis and had medication this  morning. We will hold of on CTA at this time   [AH]  1605 Patient thoracentesis completed.  Dr. Darcella Cheshire and I discussed findings he states that the fluid was predominantly bloody and clotted he had difficulty removing much and was only able to get about 400 cc off.  Patient states that he does feel like he can take a deeper breath and his breathing is somewhat improved.   [AH]    Clinical Course User Index [AH] Margarita Mail, PA-C   MDM Rules/Calculators/A&P                          This patient complains of sob, this involves an extensive number of treatment options, and is a complaint that carries with it a high risk of complications and morbidity.  The differential diagnosis includes The emergent differential diagnosis for shortness of breath includes, but is not limited to, Pulmonary edema, bronchoconstriction, Pneumonia, Pulmonary embolism, Pneumotherax/ Hemothorax, Dysrythmia, ACS. In this patient's case, specific concern for PE, secondary bacterial Pneumonia, Hemothorax, Empyema, Large volume plerual effusion   I Ordered, reviewed, and interpreted labs, which included: CBC- WBC count increase from 10 to 12 BNP- insignificant, slightly above normal CMP with mildly elevated glucose in the setting of severe illness, liver enzymes mildly elevated.   Pleural fluid analysis shows gram pos cocci on stain and elevated neutrophil count suggestive of Empyema  I ordered medication Dilaudid for pain and Vanc/ Cefepime for empyema I ordered imaging studies which included 1 v cxr, thoracentesis, CTA chest and I independently visualized and interpreted imaging which showed  Large R Pleural effusion, Tracheal deviation to the left improving infiltrates on the left side. Post procedure CXR shows improvement in effusion, no PTX. CTA without PE Previous records obtained and reviewed  I consulted With Dr. Melvyn Novas of PCCM, Dr. Thornton Papas of Radiology, and Dr. Cyndia Bent of [place] and discussed lab and imaging  findings  Critical interventions: emergent Thoracentesis  Due to enlarging plerutal effusion causing tension and tracheal deviation   After the interventions stated above, I reevaluated the patient and found patient breathing moderately improved.  Patient here with SOB due to hemorrhagic empyema on the R. Patient will likely need surgical intervention as clot unable to be removed with thoracentesis. Patient stabilized here in the er and will be admitted.    Ryan Turner was evaluated in Emergency Department on 03/15/2020 for the symptoms described in the history of present illness. He was evaluated in the context of the global COVID-19 pandemic, which necessitated consideration that the patient might be at risk for infection with the SARS-CoV-2 virus that causes COVID-19. Institutional protocols and algorithms that pertain to the evaluation of patients at risk for COVID-19 are in a state of rapid change based on information released by regulatory bodies including the CDC and federal and state organizations. These policies and algorithms were followed during the patient's care in the ED.  Final Clinical Impression(s) / ED Diagnoses Final diagnoses:  SOB (shortness of breath)    Rx / DC Orders ED Discharge Orders    None       Margarita Mail, PA-C 03/15/20 1420    Margarita Mail, PA-C 03/15/20 1423    Fredia Sorrow, MD 03/22/20 1700

## 2020-03-14 NOTE — Telephone Encounter (Signed)
Ryan Turner called states they are going to [facility] ER due to breathing has gotten worse. She wanted to make sure you know and that if you need to call her back you can. She states that when patient got up moving around his O2 was 87.   CB# [phone]

## 2020-03-14 NOTE — Consult Note (Signed)
Full note to follow/ discussed with EDP PA Abigail > he has almost equivalent of a tension hydro on the Right on pCXR (though not classic) so needs decompression this afternoon - either by IR or I can do on emergency basis if he accepts Risk vs benefit.  Christinia Gully, MD Pulmonary and [place] [phone]   After 7:00 pm call Elink  [phone]

## 2020-03-14 NOTE — ED Provider Notes (Signed)
Medical screening examination/treatment/procedure(s) were conducted as a shared visit with non-physician practitioner(s) and myself.  I personally evaluated the patient during the encounter.      Patient seen by me along with physician assistant.  Patient presenting with significant shortness of breath.  Difficulty speaking.  Patient was diagnosed with Covid infection the end of July and then had a an extensive hospital course here in August.  Going into [facility].  Patient normally on oxygen at home.  Arrives here on 4 L nasal cannula.  Patient with increased work of breathing at rest.  And has known to have a right pleural effusion.  Chest x-ray consistent with pleural effusion with some compressive pressing on the mediastinum and the trachea some almost like a tension pneumo.  We discussed situation with Dr. Jeffie Pollock pulmonary critical care.  We were thinking in terms of CT angio chest but he said based on the chest x-ray just get the pleural effusion drained.  We sent patient over to have that done.  It is mostly bloody and they could only get out about 400 cc.  We will proceed with CT angio will also discussed with Dr. Jeffie Pollock again.  Cardiothoracic surgery may need to get involved.   CRITICAL CARE Performed by: Fredia Sorrow Total critical care time: 45 minutes Critical care time was exclusive of separately billable procedures and treating other patients. Critical care was necessary to treat or prevent imminent or life-threatening deterioration. Critical care was time spent personally by me on the following activities: development of treatment plan with patient and/or surrogate as well as nursing, discussions with consultants, evaluation of patient's response to treatment, examination of patient, obtaining history from patient or surrogate, ordering and performing treatments and interventions, ordering and review of laboratory studies, ordering and review of radiographic studies, pulse oximetry and  re-evaluation of patient's condition.    Fredia Sorrow, MD 03/14/20 [phone]

## 2020-03-14 NOTE — Procedures (Signed)
PreOperative Dx: RIGHT pleural effusion Postoperative Dx: RIGHT hemothorax, old Procedure:   US guided RIGHT thoracentesis Radiologist:  Thornton Papas Anesthesia:  10 ml of 1% lidocaine Specimen:  400 mL of dark old bloody colored fluid EBL:   < 1 ml Complications: None

## 2020-03-14 NOTE — ED Notes (Signed)
CRITICAL VALUE ALERT  Critical Value:  Gram positive cocci on fluid gram stain  Date & Time Notied:  03/14/2020, 1646  Provider Notified: A. Harris PA  Orders Received/Actions taken: antibiotics ordered

## 2020-03-14 NOTE — ED Triage Notes (Addendum)
Pt c/o of shortness of breath with oxygen at 4L. Pt was recently hospitalized with covid and has a right plural effusion that his doctor recommends to be aspiration. Increased work of breathing at rest.

## 2020-03-14 NOTE — Progress Notes (Signed)
Thoracentesis complete no signs of dsitress.

## 2020-03-14 NOTE — H&P (Signed)
History and Physical    Ryan Turner RKY:[id number] DOB: October 10, 1950 DOA: 03/14/2020  PCP: [name], MD   Patient coming from: Home  I have personally briefly reviewed patient's old medical records in [place]  Chief Complaint: Home  HPI: Ryan Turner is a 69 y.o. male with medical history significant for recent covid PNA, hypertension.   Patient recently prolonged hospitalization 8/1 - 8/13 for Covid pneumonia with acute respiratory failure, complicated by DVT.  Patient was treated with remdesivir, Actemra and discharged home on steroids taper, and Eliquis, home O2 3 L at rest, up to 6 L with exertion.   Patient tested positive for COVID-19 7/26.  Patient presented to the ED with complaints of increasing difficulty breathing, and right-sided chest pain worse with deep breathing.  At home today, patient walked to open his garage door, and his O2 sat dropped to 68%.  He has a known large right-sided pleural effusion.  His last dose of Eliquis was this morning.  ED Course: Temperature 98.6.  Heart rate initially 111, initial tachcypnea 22, O2 sats 93 to 95% on 4 L O2.  WBC 12.7.  Mild transaminitis - AST 66, ALT 84.  BNP 160.  Covid test negative.  Portable chest x-ray interval increase in size of layering right pleural effusion now large.  PCCM was consulted, Dr. Melvyn Novas recommended thoracentesis which was done by IR, Dr. Thornton Papas.  400 mils of thick dark old bloody right pleural effusion fluid was removed.  More fluid could not be removed.  Repeat portable chest x-ray shows persistent right pleural effusion/hemothorax, right basilar atelectasis and bilateral infiltrates.  CTA chest showed patchy bilateral airspace opacities consistent with COVID-19 positivity.  And a large right-sided pleural effusion despite recent thoracentesis, some compressive consolidation on the right lower lobe.  Left lower extremity ultrasound negative for DVT. EDP talked to Dr. Cyndia Bent with  cardiothoracic surgery, recommended admission to [facility].  Hold Eliquis, start IV Heparin.  Pleural fluid Gram stain showing gram-positive cocci.  IV vancomycin and cefepime started.  Review of Systems: As per HPI all other systems reviewed and negative.  Past Medical History:  Diagnosis Date  . Cataract 2014   removed  . COVID-19   . DDD (degenerative disc disease), lumbar   . Diverticulosis   . Headache   . Hypertension   . IBS (irritable bowel syndrome)     Past Surgical History:  Procedure Laterality Date  . cataracts    . COLON SURGERY  1995   fistula  . COLONOSCOPY    . EYE SURGERY  2014   catarats/lens replaced  . mohs  2015   skin cancer from nose  . POLYPECTOMY    . VASECTOMY  1985     reports that he has been smoking cigars. He has never used smokeless tobacco. He reports current alcohol use of about 6.0 standard drinks of alcohol per week. He reports that he does not use drugs.  Allergies  Allergen Reactions  . Morphine     Other reaction(s): VOMITING    Family History  Problem Relation Age of Onset  . Cancer Mother        lung (unknown primary)  . Cancer Father        lung mets to spine  . Cancer Brother        melanoma  . Leukemia Maternal Grandfather   . Stomach cancer Paternal Grandmother   . Colon cancer Neg Hx   . Colon polyps Neg Hx   .  Diabetes Neg Hx   . Esophageal cancer Neg Hx   . Rectal cancer Neg Hx     Prior to Admission medications   Medication Sig Start Date End Date Taking? Authorizing Provider  acetaminophen (TYLENOL) 325 MG tablet Take 2 tablets (650 mg total) by mouth every 6 (six) hours as needed for mild pain or headache (fever >/= 101). 03/03/20  Yes Cherene Altes, MD  apixaban (ELIQUIS) 5 MG TABS tablet Take 1 tablet (5 mg total) by mouth 2 (two) times daily. 04/08/20  Yes Cherene Altes, MD  celecoxib (CELEBREX) 200 MG capsule TAKE 1 CAPSULE (200 MG TOTAL) BY MOUTH AS NEEDED. Patient taking differently: Take 200  mg by mouth daily as needed for mild pain.  01/04/19  Yes [name], MD  docusate sodium (COLACE) 100 MG capsule Take 200 mg by mouth daily as needed for mild constipation or moderate constipation.    Yes [provider]  Glucosamine-Chondroit-Vit C-Mn (GLUCOSAMINE CHONDROITIN COMPLX) CAPS Take 2 capsules by mouth daily. 1800 mg   Yes [provider]  Multiple Vitamins-Minerals (OCUVITE PRESERVISION PO) Take 2 tablets by mouth daily.    Yes [provider]  Omega-3 Fatty Acids (FISH OIL) 1200 MG CAPS Take 2 capsules by mouth daily.   Yes [provider]  predniSONE (DELTASONE) 20 MG tablet Take 1 tablet (20 mg total) by mouth daily with breakfast for 7 days. 03/15/20 03/22/20 Yes Cherene Altes, MD  traMADol (ULTRAM) 50 MG tablet Take 1 tablet (50 mg total) by mouth every 8 (eight) hours as needed for up to 10 days. 03/13/20 03/23/20 Yes [name], MD  zolpidem (AMBIEN) 10 MG tablet Take 0.5-1 tablets (5-10 mg total) by mouth at bedtime as needed. for sleep 11/04/19  Yes [name], MD  clotrimazole-betamethasone (LOTRISONE) cream Apply 1 application topically 2 (two) times daily. Patient not taking: Reported on 03/14/2020 03/09/20   [name], MD  fluticasone [facility]) 50 MCG/ACT nasal spray Place into both nostrils as needed for allergies or rhinitis. Patient not taking: Reported on 03/09/2020    [provider]  predniSONE (DELTASONE) 10 MG tablet Take 3 tablets (30 mg total) by mouth daily with breakfast for 7 days. Patient not taking: Reported on 03/14/2020 03/08/20 03/15/20  Cherene Altes, MD  predniSONE (DELTASONE) 10 MG tablet Take 1 tablet (10 mg total) by mouth daily with breakfast for 7 days. Patient not taking: Reported on 03/09/2020 03/22/20 03/29/20  Cherene Altes, MD  sildenafil (VIAGRA) 100 MG tablet Take 0.5-1 tablets (50-100 mg total) by mouth daily as needed for erectile dysfunction. Patient not taking:  Reported on 03/09/2020 07/14/19   [name], MD    Physical Exam: Vitals:   03/14/20 1225 03/14/20 1456  BP: 136/80 130/76  Pulse: (!) 111 (!) 106  Resp: (!) 22 (!) 27  Temp: 98.6 F (37 C)   TempSrc: Oral   SpO2: 94% 93%  Weight: 111.1 kg   Height: 6\' 1"  (1.854 m)     Constitutional: NAD, calm, comfortable Vitals:   03/14/20 1225 03/14/20 1456  BP: 136/80 130/76  Pulse: (!) 111 (!) 106  Resp: (!) 22 (!) 27  Temp: 98.6 F (37 C)   TempSrc: Oral   SpO2: 94% 93%  Weight: 111.1 kg   Height: 6\' 1"  (1.854 m)    Eyes: PERRL, lids and conjunctivae normal ENMT: Mucous membranes are moist. Neck: normal, supple, no masses, no thyromegaly Respiratory:Normal respiratory effort.  No accessory muscle use.  Cardiovascular: Regular rate and rhythm,  2+ pedal pulses.   Abdomen: no tenderness, no masses palpated. No hepatosplenomegaly. Bowel sounds positive.  Musculoskeletal: no clubbing / cyanosis. No joint deformity upper and lower extremities. Good ROM, no contractures. Normal muscle tone.  Skin: no rashes, lesions, ulcers. No induration Neurologic: No apparent cranial abnormality, moving extremities spontaneously. Psychiatric: Normal judgment and insight. Alert and oriented x 3. Normal mood.   Labs on Admission: I have personally reviewed following labs and imaging studies  CBC: Recent Labs  Lab 03/09/20 1659 03/14/20 1247  WBC 10.8 12.7*  NEUTROABS 9,623* 11.4*  HGB 15.5 13.9  HCT 45.0 42.0  MCV 93.0 96.3  PLT 174 333   Basic Metabolic Panel: Recent Labs  Lab 03/09/20 1659 03/14/20 1247  NA 139 133*  K 5.6* 4.2  CL 102 97*  CO2 26 27  GLUCOSE 135* 143*  BUN 18 11  CREATININE 0.64* 0.59*  CALCIUM 9.0 8.5*   Liver Function Tests: Recent Labs  Lab 03/09/20 1659 03/14/20 1247  AST 25 66*  ALT 55* 84*  ALKPHOS  --  123  BILITOT 0.5 0.7  PROT 5.7* 6.5  ALBUMIN  --  2.7*    Radiological Exams on Admission: DG Chest 1 View  Result Date:  03/14/2020 CLINICAL DATA:  RIGHT pneumothorax post thoracentesis EXAM: CHEST  1 VIEW COMPARISON:  Extra tori AP chest radiograph 1451 hours compared to earlier study of 1254 hours FINDINGS: Enlargement of cardiac silhouette. BILATERAL pulmonary infiltrates consistent with history of recent COVID pneumonia. Again identified moderate to large RIGHT pleural effusion, accentuated by expiratory technique. Significant RIGHT basilar atelectasis. No pneumothorax following thoracentesis. IMPRESSION: No pneumothorax following RIGHT thoracentesis. Persistent RIGHT pleural effusion/hemothorax, RIGHT basilar atelectasis, and BILATERAL infiltrates. Electronically Signed   By: Lavonia Dana M.D.   On: 03/14/2020 15:28   CT Angio Chest PE W and/or Wo Contrast  Result Date: 03/14/2020 CLINICAL DATA:  Shortness of breath, COVID-19 positivity, recent right thoracentesis EXAM: CT ANGIOGRAPHY CHEST WITH CONTRAST TECHNIQUE: Multidetector CT imaging of the chest was performed using the standard protocol during bolus administration of intravenous contrast. Multiplanar CT image reconstructions and MIPs were obtained to evaluate the vascular anatomy. CONTRAST:  137mL OMNIPAQUE IOHEXOL 350 MG/ML SOLN COMPARISON:  Chest x-ray from earlier in the same day. FINDINGS: Cardiovascular: Thoracic aorta demonstrates mild atherosclerotic calcification without aneurysmal dilatation or dissection. No significant cardiac enlargement is seen. Scattered coronary calcifications are noted. The pulmonary artery shows a normal branching pattern. Motion artifact limits evaluation of the lower lobe branches. No definitive intraluminal filling defect to suggest pulmonary embolism is noted. Mediastinum/Nodes: Thoracic inlet is within normal limits. Scattered small hilar and mediastinal lymph nodes are noted likely reactive in nature. The esophagus as visualized is within normal limits. Lungs/Pleura: Patchy peripheral infiltrates are identified within both lungs  consistent with the given clinical history of COVID-19 positivity. Additionally there is consolidation in the right lower lobe likely of a more compressive affect given the large right-sided pleural effusion. This persists despite recent thoracentesis. No pneumothorax is noted. Upper Abdomen: Visualized upper abdomen shows no acute abnormality. Musculoskeletal: No chest wall abnormality. No acute or significant osseous findings. Review of the MIP images confirms the above findings. IMPRESSION: Patchy bilateral airspace opacities consistent with the given clinical history of COVID-19 positivity. Large right-sided pleural effusion despite recent thoracentesis. This causes some compressive consolidation particularly in the right lower lobe. No pneumothorax is noted. No evidence of pulmonary emboli. Aortic Atherosclerosis (  ICD10-I70.0). Electronically Signed   By: Inez Catalina M.D.   On: 03/14/2020 18:57   US Venous Img Lower Unilateral Left (DVT)  Result Date: 03/14/2020 CLINICAL DATA:  Shortness of breath EXAM: Left LOWER EXTREMITY VENOUS DOPPLER ULTRASOUND TECHNIQUE: Gray-scale sonography with compression, as well as color and duplex ultrasound, were performed to evaluate the deep venous system(s) from the level of the common femoral vein through the popliteal and proximal calf veins. COMPARISON:  None. FINDINGS: VENOUS Normal compressibility of the common femoral, superficial femoral, and popliteal veins, as well as the visualized calf veins. Visualized portions of profunda femoral vein and great saphenous vein unremarkable. No filling defects to suggest DVT on grayscale or color Doppler imaging. Doppler waveforms show normal direction of venous flow, normal respiratory plasticity and response to augmentation. Limited views of the contralateral common femoral vein are unremarkable. OTHER None. Limitations: none IMPRESSION: Negative. Electronically Signed   By: Donavan Foil M.D.   On: 03/14/2020 17:53   DG  Chest Port 1 View  Result Date: 03/14/2020 CLINICAL DATA:  Increasing shortness of breath.  COVID positive. EXAM: PORTABLE CHEST 1 VIEW COMPARISON:  03/10/2020 FINDINGS: Interval increase in the size of a layering right pleural effusion, now large. No substantial left pleural effusion. Increased overlying opacities in the right lung. Cardiopericardial silhouette is similar to prior, but partially obscured. No discernible pneumothorax. Improved opacities in the left lung with improved aeration of the left lung. IMPRESSION: Interval increase in the size of a layering right pleural effusion, now large. Increased overlying opacities in the right lung may represent atelectasis, aspiration, and/or pneumonia. Improved aeration of the left lung. Electronically Signed   By: Margaretha Sheffield MD   On: 03/14/2020 13:10   US THORACENTESIS ASP PLEURAL SPACE W/IMG GUIDE  Result Date: 03/14/2020 INDICATION: RIGHT pleural effusion EXAM: ULTRASOUND GUIDED DIAGNOSTIC AND THERAPEUTIC RIGHT THORACENTESIS MEDICATIONS: None. COMPLICATIONS: None immediate. PROCEDURE: An ultrasound guided thoracentesis was thoroughly discussed with the patient and questions answered. The benefits, risks, alternatives and complications were also discussed. The patient understands and wishes to proceed with the procedure. Written consent was obtained. Ultrasound was performed to localize and mark an adequate pocket of fluid in the posterior RIGHT chest. The area was then prepped and draped in the normal sterile fashion. 1% Lidocaine was used for local anesthesia. Under ultrasound guidance a 8 French thoracentesis catheter was introduced. Thoracentesis was performed. The catheter was removed and a dressing applied. FINDINGS: A total of approximately 400 mL of thick dark old bloody RIGHT pleural fluid was removed. Samples were sent to the laboratory as requested by the clinical team. IMPRESSION: Successful ultrasound guided RIGHT thoracentesis yielding  400 mL of dark old bloody pleural fluid. Electronically Signed   By: Lavonia Dana M.D.   On: 03/14/2020 15:27    EKG: Pending.   Assessment/Plan Principal Problem:   Empyema of right pleural space (HCC) Active Problems:   Hypertension   Pneumonia due to COVID-19 virus   DVT (deep venous thrombosis) (HCC)   Empyema of the right pleural space- s/p recent COVID-19 infection.  93 to 95% on 4 L O2.  He is on home O2 3 to 6L.   400 Mils of dark red bloody fluid drawn via ultrasound-guided thoracentesis by Dr. Thornton Papas.  Repeat chest imaging x-ray and CTA chest shows persistent larrge effusion. - Pleural fluid Gram stain showing gram-positive cocci, continue IV vancomycin and cefepime - Follow-up pleural fluid cultures - EDP talked to Dr. Cyndia Bent, recommended admission to [facility], to be evaluated by CTS -Last dose of Eliquis this morning, hold Eliquis, start IV Heparin -N.p.o. midnight -0.5mg  IV Dilaudid as needed  Left lower extremity DVT- on recent hospitalization.  Left lower extremity ultrasound today negative for DVT. -Hold Eliquis for now, start IV heparin  Hypertension-stable. -Not on antihypertensives.  Recent COVID-19 virus infection-tested +7/26, completed remdesivir and Actemra.  Discharged home on 3 to 6 L of oxygen (at rest and with ambulation), and steroid taper. -Continue steroid course  DVT prophylaxis: Heparin Code Status: DNR, confirmed with patient at bedside and consistent with prior documentation in chart. Family Communication: None at bedside Disposition Plan: ~ 2 days, pending evaluation and management of large pleural effusion by cardiothoracic surgery Consults called: Cardiothoracic surgery Admission status: Inpatient, telemetry I certify that at the point of admission it is my clinical judgment that the patient will require inpatient hospital care spanning beyond 2 midnights from the point of admission due to high intensity of service, high risk for further  deterioration and high frequency of surveillance required.   Bethena Roys MD [facility]  03/14/2020, 8:48 PM

## 2020-03-14 NOTE — Telephone Encounter (Signed)
Called and spoke with pt's wife Gwinda Passe letting her know that it seems like we had a cancellation for a consult appt and we can change pt's appt to Friday, 8/27 at 3:30 for pt to see Dr. Lamonte Sakai. She verbalized understanding. Pt's appt has been changed.  Called PCP office to speak with Dr. Samella Parr nurse but unable to reach. Left message for her to return call.

## 2020-03-15 ENCOUNTER — Inpatient Hospital Stay (HOSPITAL_COMMUNITY): Payer: BC Managed Care – PPO

## 2020-03-15 ENCOUNTER — Encounter: Payer: Self-pay | Admitting: Family Medicine

## 2020-03-15 ENCOUNTER — Other Ambulatory Visit: Payer: Self-pay

## 2020-03-15 DIAGNOSIS — J869 Pyothorax without fistula: Secondary | ICD-10-CM

## 2020-03-15 DIAGNOSIS — I82451 Acute embolism and thrombosis of right peroneal vein: Secondary | ICD-10-CM

## 2020-03-15 DIAGNOSIS — U071 COVID-19: Secondary | ICD-10-CM

## 2020-03-15 DIAGNOSIS — J1282 Pneumonia due to coronavirus disease 2019: Secondary | ICD-10-CM

## 2020-03-15 DIAGNOSIS — J942 Hemothorax: Secondary | ICD-10-CM

## 2020-03-15 LAB — CBC WITH DIFFERENTIAL/PLATELET
Abs Immature Granulocytes: 0.08 10*3/uL — ABNORMAL HIGH (ref 0.00–0.07)
Basophils Absolute: 0 10*3/uL (ref 0.0–0.1)
Basophils Relative: 0 %
Eosinophils Absolute: 0.1 10*3/uL (ref 0.0–0.5)
Eosinophils Relative: 1 %
HCT: 38.1 % — ABNORMAL LOW (ref 39.0–52.0)
Hemoglobin: 12.6 g/dL — ABNORMAL LOW (ref 13.0–17.0)
Immature Granulocytes: 1 %
Lymphocytes Relative: 7 %
Lymphs Abs: 0.6 10*3/uL — ABNORMAL LOW (ref 0.7–4.0)
MCH: 32.1 pg (ref 26.0–34.0)
MCHC: 33.1 g/dL (ref 30.0–36.0)
MCV: 97.2 fL (ref 80.0–100.0)
Monocytes Absolute: 0.6 10*3/uL (ref 0.1–1.0)
Monocytes Relative: 6 %
Neutro Abs: 7.7 10*3/uL (ref 1.7–7.7)
Neutrophils Relative %: 85 %
Platelets: 218 10*3/uL (ref 150–400)
RBC: 3.92 MIL/uL — ABNORMAL LOW (ref 4.22–5.81)
RDW: 14.8 % (ref 11.5–15.5)
WBC: 9 10*3/uL (ref 4.0–10.5)
nRBC: 0 % (ref 0.0–0.2)

## 2020-03-15 LAB — APTT
aPTT: 42 seconds — ABNORMAL HIGH (ref 24–36)
aPTT: 62 seconds — ABNORMAL HIGH (ref 24–36)

## 2020-03-15 LAB — HEPARIN LEVEL (UNFRACTIONATED): Heparin Unfractionated: 0.88 IU/mL — ABNORMAL HIGH (ref 0.30–0.70)

## 2020-03-15 LAB — CORTISOL: Cortisol, Plasma: 13.7 ug/dL

## 2020-03-15 LAB — MRSA PCR SCREENING: MRSA by PCR: NEGATIVE

## 2020-03-15 LAB — SEDIMENTATION RATE: Sed Rate: 69 mm/hr — ABNORMAL HIGH (ref 0–16)

## 2020-03-15 MED ORDER — SALINE SPRAY 0.65 % NA SOLN
1.0000 | NASAL | Status: DC | PRN
  Filled 2020-03-15 (×2): qty 44

## 2020-03-15 MED ORDER — LIDOCAINE HCL 1 % IJ SOLN
INTRAMUSCULAR | Status: AC
  Filled 2020-03-15: qty 20

## 2020-03-15 MED ORDER — MELATONIN 3 MG PO TABS
3.0000 mg | ORAL_TABLET | Freq: Every day | ORAL | Status: DC
  Administered 2020-03-15 – 2020-03-21 (×7): 3 mg via ORAL
  Filled 2020-03-15 (×7): qty 1

## 2020-03-15 MED ORDER — FENTANYL CITRATE (PF) 100 MCG/2ML IJ SOLN
INTRAMUSCULAR | Status: AC
Start: 2020-03-15 — End: 2020-03-16
  Filled 2020-03-15: qty 2

## 2020-03-15 MED ORDER — MIDAZOLAM HCL 2 MG/2ML IJ SOLN
INTRAMUSCULAR | Status: AC | PRN
  Administered 2020-03-15: 1 mg via INTRAVENOUS

## 2020-03-15 MED ORDER — FENTANYL CITRATE (PF) 100 MCG/2ML IJ SOLN
INTRAMUSCULAR | Status: AC | PRN
  Administered 2020-03-15: 50 ug via INTRAVENOUS

## 2020-03-15 MED ORDER — MIDAZOLAM HCL 2 MG/2ML IJ SOLN
INTRAMUSCULAR | Status: AC
  Filled 2020-03-15: qty 2

## 2020-03-15 MED ORDER — SODIUM CHLORIDE 0.9 % IV SOLN
INTRAVENOUS | Status: DC | PRN
  Administered 2020-03-15 – 2020-03-20 (×2): 250 mL via INTRAVENOUS

## 2020-03-15 NOTE — Progress Notes (Addendum)
NAME:  Ryan Turner, MRN:  [id number], DOB:  [birth year], LOS: 1 ADMISSION DATE:  03/14/2020, CONSULTATION DATE:  03/14/20  REFERRING MD:  EDP, CHIEF COMPLAINT:  Sob/L hemothorax   Brief History   69 yowm active cigar smoker from [place] s/p covid 19 pna and steady improvement in activity tol p d/c on 2lpm rest/ 4lpm with activity and steroid taper then 3 d PTA with new sob/ R pleuritic cp and orthopnea > ED am 8/24 [facility] with ? Tension hydrothorax > emergent T centesis by IR yielded only 400 cc blood and pccm asked to evaluate with ddx = staph empyema.  History of present illness   Date of Admission: 02/20/2020 Date of Discharge: 03/03/2020  Attending Physician:Ryan Hennie Duos, MD  Patient's TKZ:[id number], Ryan Mcgee, MD  Consults: none   Disposition: D/C home   Date of Positive COVID Test: 02/14/20  Date Isolation Ends: 03/03/20  COVID-19 specific Treatment: Steroid 8/1 > slow taper over 3+ weeks post discharge  Remdesivir 8/1 > 8/5 Actemra 8/2  Tests Needing Follow-up: -pt will need refill for Eliquis to complete full 3 months of tx for his acute L DVT -assess O2 sats at rest and w/ ambulation to determine if weaning of O2 possible -f/u BMET to assess K+ and renal fxn   Discharge Diagnoses: COVID Pneumonia Acute hypoxic respiratory failure Acute Left lower extremity DVT Thrombocytopenia Transaminitis Mild hyperkalemia HTN Mild hyponatremia Obesity -Body mass index is 33.39 kg/m.  Initial presentation: 69 year old with a history of HTN and IBS who was diagnosed with Covid 7/26 and presented to the ED 8/1 with worsening shortness of breath. He was found to be acutely profoundly hypoxic with Covid pneumonia. His hospital course was complicated by severe persistent hypoxia requiring prolonged heated high flow nasal cannula support. He was also confirmed to have a left lower extremity DVT.  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 -serial chest x-rays without change -completed course of remdesivir -no evidence of volume overload with significant negative balance at time of d/c -steroids being tapered very slowly given persistent hypoxia -titrate oxygen as able - home O2 arranged (3L at rest - up to 6L w/ exertion)  Acute Left lower extremity DVT Felt to be provoked by Covid - Lovenox transitioned to Eliquis-we will plan for 3 months of Eliquis  Thrombocytopenia Mild and stable at time of d/c   Transaminitis Due to Covid and treatment of same -essentially resolved at time of d/c   Mild hyperkalemia Corrected with use of Lokelma which was stopped prior to d/c   HTN patient states he does not take beta-blocker at home therefore I am stopped propranololwhich was initiated as an inpatient - gently hydrated prior to d/c - BP stable   Mild hyponatremia Likely due to hypovolemia- corrected w/ hydration   Obesity -Body mass index is 33.39 kg/m.   Past Medical History   HBP  Significant Hospital Events     Consults:  PCCM 8/24   Procedures:  R Thoracentesis 8/24  = 400 cc blood with glucose < 20 and LDH 2,777 and wbc > 10k with mostly polys  Significant Diagnostic Tests:  Venous dopplers 02/22/20 Findings consistent with acute deep vein thrombosis involving the left posterior tibial veins, and left peroneal veins  Venous doppler L side 8/24 >>> negative  Micro Data:  Covid 19  8/24  Neg  Pleural fluid 8/24 >>> gpc's >>>   Antimicrobials:  maxepime 8/24 >>>  vanc  8/24 >>>   Scheduled Meds: . predniSONE  20 mg Oral Q breakfast   Continuous Infusions: . sodium chloride 250 mL (03/15/20 0235)  . ceFEPime (MAXIPIME) IV 2 g (03/15/20 0842)  . heparin 1,600 Units/hr (03/14/20 2344)  . vancomycin     PRN Meds:.sodium chloride, acetaminophen **OR** acetaminophen, HYDROmorphone (DILAUDID) injection, ondansetron **OR** ondansetron (ZOFRAN) IV,  polyethylene glycol   Interim history/subjective:  Status post right thoracentesis with 400 cc of bloody drainage  Objective   Blood pressure (!) 145/81, pulse 93, temperature 97.9 F (36.6 C), temperature source Oral, resp. rate 18, height 6\' 1"  (1.854 m), weight 112 kg, SpO2 93 %.       No intake or output data in the 24 hours ending 03/15/20 0957 Filed Weights   03/14/20 1225 03/15/20 0140  Weight: 111.1 kg 112 kg    Examination: General: Obese male with increased work of breathing at rest HEENT: No JVD or lymphadenopathy is appreciated Neuro: Grossly intact CV: Heart sounds are regular PULM: Decreased breath sounds on the right side, dyspnea at rest is noted GI: soft, bsx4 active  Extremities: warm/dry, 1+ edema  Skin: no rashes or lesions    Resolved Hospital Problem list     Assessment & Plan:   Acute respiratory distress in the setting of a right hemothorax while on Eliquis for left DVT.  CT scan reveals loculated effusion empyema.  He has a history of COVID-19 status post MRSA superinfection.  Transferred to [facility] 03/14/2020 for possible thoracic surgery versus interventional radiology Pleurx catheter and lytics.  Right pleural effusion with right thoracentesis 03/14/2020 revealed 400 mL of dark old bloody colored fluid Note no evidence of DVT on recent lower extremity Doppler study.  Pleural fluid from 8/24 growing gram-positive cocci. Noted to be n.p.o. Thoracic surgery has not officially placed notice of the state Continue empirical antimicrobial therapy Monitor culture data I suspect the size of the empyema following 400 cc removal per interventional radiology that he will need thoracic surgery intervention.     Best practice:  Diet: Currently n.p.o. for possible thoracic intervention Pain/Anxiety/Delirium protocol (if indicated): n/a VAP protocol (if indicated): n/a DVT prophylaxis: sub q hep  GI prophylaxis: n/a Glucose control:  n/a Mobility: up as tol Code Status: full code Family Communication: wife on speaker phone pm 8/24/ medical technologist. 8/25 updated pt at bedside Disposition: needs to be admitted but  ? Transfer directly to [facility] from ER or [facility] to [facility]  for T surgery vs Pluerex/ lytics transfer date 03/14/2020  Labs   CBC: Recent Labs  Lab 03/09/20 1659 03/14/20 1247  WBC 10.8 12.7*  NEUTROABS 9,623* 11.4*  HGB 15.5 13.9  HCT 45.0 42.0  MCV 93.0 96.3  PLT 174 621    Basic Metabolic Panel: Recent Labs  Lab 03/09/20 1659 03/14/20 1247  NA 139 133*  K 5.6* 4.2  CL 102 97*  CO2 26 27  GLUCOSE 135* 143*  BUN 18 11  CREATININE 0.64* 0.59*  CALCIUM 9.0 8.5*   GFR: Estimated Creatinine Clearance: 111 mL/min (A) (by C-G formula based on SCr of 0.59 mg/dL (L)). Recent Labs  Lab 03/09/20 1659 03/14/20 1247  WBC 10.8 12.7*    Liver Function Tests: Recent Labs  Lab 03/09/20 1659 03/14/20 1247  AST 25 66*  ALT 55* 84*  ALKPHOS  --  123  BILITOT 0.5 0.7  PROT 5.7* 6.5  ALBUMIN  --  2.7*   No results for input(s): LIPASE,  AMYLASE in the last 168 hours. No results for input(s): AMMONIA in the last 168 hours.  ABG    Component Value Date/Time   PHART 7.480 (H) 02/21/2020 0532   PCO2ART 42.3 02/21/2020 0532   PO2ART 46 (L) 02/21/2020 0532   HCO3 31.4 (H) 02/21/2020 0532   TCO2 33 (H) 02/21/2020 0532   O2SAT 84.0 02/21/2020 0532     Coagulation Profile: No results for input(s): INR, PROTIME in the last 168 hours.  Cardiac Enzymes: No results for input(s): CKTOTAL, CKMB, CKMBINDEX, TROPONINI in the last 168 hours.  HbA1C: No results found for: HGBA1C  CBG: No results for input(s): GLUCAP in the last 168 hours.          Richardson Landry Minor ACNP Acute Care Nurse Practitioner [place] Please consult Amion 03/15/2020, 9:58 AM  Attending note: I have seen and examined the patient. History, labs and imaging reviewed.  69 year old with recent  admission for Covid pneumonia admitted with right pneumonia, empyema Status post thoracentesis with pleural fluid cultures growing GPC's  Blood pressure (!) 164/98, pulse (!) 105, temperature 98.7 F (37.1 C), temperature source Oral, resp. rate 20, height 6\' 1"  (1.854 m), weight 112 kg, SpO2 90 %. Gen:      No acute distress HEENT:  EOMI, sclera anicteric Neck:     No masses; no thyromegaly Lungs:    Clear to auscultation bilaterally; normal respiratory effort CV:         Regular rate and rhythm; no murmurs Abd:      + bowel sounds; soft, non-tender; no palpable masses, no distension Ext:    No edema; adequate peripheral perfusion Skin:      Warm and dry; no rash Neuro: alert and oriented x 3 Psych: normal mood and affect  Assessment/plan: Loculated right effusion, empyema Evaluation by cardiothoracic surgery.  If not a candidate for VATS then will need chest tube and lytic therapy for drainage. Continue antibiotics.  Marshell Garfinkel MD [facility] 03/15/2020, 2:23 PM

## 2020-03-15 NOTE — Procedures (Addendum)
Interventional Radiology Procedure Note  Procedure: Placement of a Right pleural drain, for empyema and tPA instillation.  92F drain. 3-way stopcock, with cap on the sidearm  Complications: None  Recommendations:  - To evac chamber - culture sent - Routine care  - ok to restart heparin now  Signed,  Dulcy Fanny. Earleen Newport, DO

## 2020-03-15 NOTE — Progress Notes (Signed)
PCCM progress note  Patient seen after return from IR Chest tube in place and has already drained 700 cc of serosanguineous fluid  Patient states that breathing is better  Continue chest tube to suction Chest x-ray tomorrow. We will review chest imaging and determine if DNase/TPA is required.  Marshell Garfinkel MD [facility] Please see [email or web address] for pager details.  03/15/2020, 6:56 PM

## 2020-03-15 NOTE — Progress Notes (Signed)
Patient to room 4E18 from [place]. Vital signs obtained. On monitor CCMD notified. CHG bath completed. Alert and oriented to room and call light. Call bell within reach.  Era Bumpers, RN

## 2020-03-15 NOTE — Progress Notes (Addendum)
[facility] PROGRESS NOTE    Progress Note  Ryan Turner  GDJ:[id number] DOB: [birth year] DOA: 03/14/2020 PCP: [name], MD     Brief Narrative:   Ryan Turner is an 69 y.o. male past medical history significant for recent COVID-19 pneumonia, essential hypertension with a prolonged hospitalization recent discharge on 03/03/2020 for Covid pneumonia complicated by DVT which he completed treatment and was discharged home on steroid taper and Eliquis and 3 L of oxygen.  Main to the ED with increased difficulty breathing and with sats dropping into the 68%, and was diagnosed with empyema in the right pleural space.  Assessment/Plan:   Loculated empyema of right pleural space (HCC) 400 cc of dark bloody fluid was obtained by thoracocentesis. Repeated image and CTA showed persistent large right-sided effusion, despite recent right-sided thoracocentesis. Critical care was consulted, pleural fluid showed gram-positive cocci, on admission he was started on IV vancomycin and cefepime. Speciation is pending, Eliquis has been held and he was started on IV heparin. He was n.p.o. after midnight, and started on IV narcotics.  Left lower extremity DVT: Holding Eliquis now on IV heparin for possible procedure.  Elevated blood pressure without a diagnosis of hypertension: She is currently on no antihypertensive medications at home.  History of a recent pneumonia due to COVID-19 virus Discharged on 3 to 6 L of oxygen, continue steroid taper.  DVT (deep venous thrombosis) (HCC) On IV heparin for now.   DVT prophylaxis: heparin Family Communication:none Status is: Inpatient  Remains inpatient appropriate because:Hemodynamically unstable   Dispo: The patient is from: Home              Anticipated d/c is to: Home              Anticipated d/c date is: 2 days              Patient currently is not medically stable to d/c.        Code Status:     Code Status Orders    (From admission, onward)         Start     Ordered   03/14/20 2218  Do not attempt resuscitation (DNR)  Continuous       Question Answer Comment  In the event of cardiac or respiratory ARREST Do not call a "code blue"   In the event of cardiac or respiratory ARREST Do not perform Intubation, CPR, defibrillation or ACLS   In the event of cardiac or respiratory ARREST Use medication by any route, position, wound care, and other measures to relive pain and suffering. May use oxygen, suction and manual treatment of airway obstruction as needed for comfort.      03/14/20 2217        Code Status History    Date Active Date Inactive Code Status Order ID Comments User Context   02/26/2020 1529 03/04/2020 0009 DNR [id number]  Jonetta Osgood, MD Inpatient   02/20/2020 2230 02/26/2020 1529 Full Code [id number]  Rolla Plate, DO ED   Advance Care Planning Activity    Advance Directive Documentation     Most Recent Value  Type of Advance Directive Living will  Pre-existing out of facility DNR order (yellow form or pink MOST form) --  "MOST" Form in Place? --        IV Access:    Peripheral IV   Procedures and diagnostic studies:   DG Chest 1 View  Result Date: 03/14/2020 CLINICAL DATA:  RIGHT pneumothorax  post thoracentesis EXAM: CHEST  1 VIEW COMPARISON:  Extra tori AP chest radiograph 1451 hours compared to earlier study of 1254 hours FINDINGS: Enlargement of cardiac silhouette. BILATERAL pulmonary infiltrates consistent with history of recent COVID pneumonia. Again identified moderate to large RIGHT pleural effusion, accentuated by expiratory technique. Significant RIGHT basilar atelectasis. No pneumothorax following thoracentesis. IMPRESSION: No pneumothorax following RIGHT thoracentesis. Persistent RIGHT pleural effusion/hemothorax, RIGHT basilar atelectasis, and BILATERAL infiltrates. Electronically Signed   By: Lavonia Dana M.D.   On: 03/14/2020 15:28   CT Angio Chest PE W  and/or Wo Contrast  Result Date: 03/14/2020 CLINICAL DATA:  Shortness of breath, COVID-19 positivity, recent right thoracentesis EXAM: CT ANGIOGRAPHY CHEST WITH CONTRAST TECHNIQUE: Multidetector CT imaging of the chest was performed using the standard protocol during bolus administration of intravenous contrast. Multiplanar CT image reconstructions and MIPs were obtained to evaluate the vascular anatomy. CONTRAST:  160mL OMNIPAQUE IOHEXOL 350 MG/ML SOLN COMPARISON:  Chest x-ray from earlier in the same day. FINDINGS: Cardiovascular: Thoracic aorta demonstrates mild atherosclerotic calcification without aneurysmal dilatation or dissection. No significant cardiac enlargement is seen. Scattered coronary calcifications are noted. The pulmonary artery shows a normal branching pattern. Motion artifact limits evaluation of the lower lobe branches. No definitive intraluminal filling defect to suggest pulmonary embolism is noted. Mediastinum/Nodes: Thoracic inlet is within normal limits. Scattered small hilar and mediastinal lymph nodes are noted likely reactive in nature. The esophagus as visualized is within normal limits. Lungs/Pleura: Patchy peripheral infiltrates are identified within both lungs consistent with the given clinical history of COVID-19 positivity. Additionally there is consolidation in the right lower lobe likely of a more compressive affect given the large right-sided pleural effusion. This persists despite recent thoracentesis. No pneumothorax is noted. Upper Abdomen: Visualized upper abdomen shows no acute abnormality. Musculoskeletal: No chest wall abnormality. No acute or significant osseous findings. Review of the MIP images confirms the above findings. IMPRESSION: Patchy bilateral airspace opacities consistent with the given clinical history of COVID-19 positivity. Large right-sided pleural effusion despite recent thoracentesis. This causes some compressive consolidation particularly in the right  lower lobe. No pneumothorax is noted. No evidence of pulmonary emboli. Aortic Atherosclerosis (ICD10-I70.0). Electronically Signed   By: Inez Catalina M.D.   On: 03/14/2020 18:57   US Venous Img Lower Unilateral Left (DVT)  Result Date: 03/14/2020 CLINICAL DATA:  Shortness of breath EXAM: Left LOWER EXTREMITY VENOUS DOPPLER ULTRASOUND TECHNIQUE: Gray-scale sonography with compression, as well as color and duplex ultrasound, were performed to evaluate the deep venous system(s) from the level of the common femoral vein through the popliteal and proximal calf veins. COMPARISON:  None. FINDINGS: VENOUS Normal compressibility of the common femoral, superficial femoral, and popliteal veins, as well as the visualized calf veins. Visualized portions of profunda femoral vein and great saphenous vein unremarkable. No filling defects to suggest DVT on grayscale or color Doppler imaging. Doppler waveforms show normal direction of venous flow, normal respiratory plasticity and response to augmentation. Limited views of the contralateral common femoral vein are unremarkable. OTHER None. Limitations: none IMPRESSION: Negative. Electronically Signed   By: Donavan Foil M.D.   On: 03/14/2020 17:53   DG Chest Port 1 View  Result Date: 03/14/2020 CLINICAL DATA:  Increasing shortness of breath.  COVID positive. EXAM: PORTABLE CHEST 1 VIEW COMPARISON:  03/10/2020 FINDINGS: Interval increase in the size of a layering right pleural effusion, now large. No substantial left pleural effusion. Increased overlying opacities in the right lung. Cardiopericardial silhouette is similar to prior, but  partially obscured. No discernible pneumothorax. Improved opacities in the left lung with improved aeration of the left lung. IMPRESSION: Interval increase in the size of a layering right pleural effusion, now large. Increased overlying opacities in the right lung may represent atelectasis, aspiration, and/or pneumonia. Improved aeration of the  left lung. Electronically Signed   By: Margaretha Sheffield MD   On: 03/14/2020 13:10   US THORACENTESIS ASP PLEURAL SPACE W/IMG GUIDE  Result Date: 03/14/2020 INDICATION: RIGHT pleural effusion EXAM: ULTRASOUND GUIDED DIAGNOSTIC AND THERAPEUTIC RIGHT THORACENTESIS MEDICATIONS: None. COMPLICATIONS: None immediate. PROCEDURE: An ultrasound guided thoracentesis was thoroughly discussed with the patient and questions answered. The benefits, risks, alternatives and complications were also discussed. The patient understands and wishes to proceed with the procedure. Written consent was obtained. Ultrasound was performed to localize and mark an adequate pocket of fluid in the posterior RIGHT chest. The area was then prepped and draped in the normal sterile fashion. 1% Lidocaine was used for local anesthesia. Under ultrasound guidance a 8 French thoracentesis catheter was introduced. Thoracentesis was performed. The catheter was removed and a dressing applied. FINDINGS: A total of approximately 400 mL of thick dark old bloody RIGHT pleural fluid was removed. Samples were sent to the laboratory as requested by the clinical team. IMPRESSION: Successful ultrasound guided RIGHT thoracentesis yielding 400 mL of dark old bloody pleural fluid. Electronically Signed   By: Lavonia Dana M.D.   On: 03/14/2020 15:27     Medical Consultants:    None.  Anti-Infectives:   vanc and cefepime  Subjective:    Ryan Turner   Objective:    Vitals:   03/15/20 0137 03/15/20 0140 03/15/20 0330 03/15/20 0546  BP: (!) 141/79  134/73 135/79  Pulse: (!) 102  91 89  Resp: (!) 22  20 20   Temp: 98.7 F (37.1 C)  98.5 F (36.9 C) 98.4 F (36.9 C)  TempSrc: Oral  Oral Oral  SpO2: 92%  94% 94%  Weight:  112 kg    Height:  6\' 1"  (1.854 m)     SpO2: 94 % O2 Flow Rate (L/min): 4 L/min  No intake or output data in the 24 hours ending 03/15/20 0742 Filed Weights   03/14/20 1225 03/15/20 0140  Weight: 111.1 kg 112 kg      Exam: General exam: In no acute distress. Respiratory system: Good air movement and clear to auscultation. Cardiovascular system: S1 & S2 heard, RRR. No JVD. Gastrointestinal system: Abdomen is nondistended, soft and nontender.  Extremities: No pedal edema. Skin: No rashes, lesions or ulcers Psychiatry: Judgement and insight appear normal. Mood & affect appropriate.    Data Reviewed:    Labs: Basic Metabolic Panel: Recent Labs  Lab 03/09/20 1659 03/14/20 1247  NA 139 133*  K 5.6* 4.2  CL 102 97*  CO2 26 27  GLUCOSE 135* 143*  BUN 18 11  CREATININE 0.64* 0.59*  CALCIUM 9.0 8.5*   GFR Estimated Creatinine Clearance: 111 mL/min (A) (by C-G formula based on SCr of 0.59 mg/dL (L)). Liver Function Tests: Recent Labs  Lab 03/09/20 1659 03/14/20 1247  AST 25 66*  ALT 55* 84*  ALKPHOS  --  123  BILITOT 0.5 0.7  PROT 5.7* 6.5  ALBUMIN  --  2.7*   No results for input(s): LIPASE, AMYLASE in the last 168 hours. No results for input(s): AMMONIA in the last 168 hours. Coagulation profile No results for input(s): INR, PROTIME in the last 168 hours. COVID-19 Labs  No  results for input(s): DDIMER, FERRITIN, LDH, CRP in the last 72 hours.  Lab Results  Component Value Date   SARSCOV2NAA NEGATIVE 03/14/2020   SARSCOV2NAA DETECTED (A) 02/14/2020    CBC: Recent Labs  Lab 03/09/20 1659 03/14/20 1247  WBC 10.8 12.7*  NEUTROABS 9,623* 11.4*  HGB 15.5 13.9  HCT 45.0 42.0  MCV 93.0 96.3  PLT 174 225   Cardiac Enzymes: No results for input(s): CKTOTAL, CKMB, CKMBINDEX, TROPONINI in the last 168 hours. BNP (last 3 results) No results for input(s): PROBNP in the last 8760 hours. CBG: No results for input(s): GLUCAP in the last 168 hours. D-Dimer: No results for input(s): DDIMER in the last 72 hours. Hgb A1c: No results for input(s): HGBA1C in the last 72 hours. Lipid Profile: No results for input(s): CHOL, HDL, LDLCALC, TRIG, CHOLHDL, LDLDIRECT in the last 72  hours. Thyroid function studies: No results for input(s): TSH, T4TOTAL, T3FREE, THYROIDAB in the last 72 hours.  Invalid input(s): FREET3 Anemia work up: No results for input(s): VITAMINB12, FOLATE, FERRITIN, TIBC, IRON, RETICCTPCT in the last 72 hours. Sepsis Labs: Recent Labs  Lab 03/09/20 1659 03/14/20 1247  WBC 10.8 12.7*   Microbiology Recent Results (from the past 240 hour(s))  SARS Coronavirus 2 by RT PCR (hospital order, performed in [facility] hospital lab) Nasopharyngeal Nasopharyngeal Swab     Status: None   Collection Time: 03/14/20 12:56 PM   Specimen: Nasopharyngeal Swab  Result Value Ref Range Status   SARS Coronavirus 2 NEGATIVE NEGATIVE Final    Comment: (NOTE) SARS-CoV-2 target nucleic acids are NOT DETECTED.  The SARS-CoV-2 RNA is generally detectable in upper and lower respiratory specimens during the acute phase of infection. The lowest concentration of SARS-CoV-2 viral copies this assay can detect is 250 copies / mL. A negative result does not preclude SARS-CoV-2 infection and should not be used as the sole basis for treatment or other patient management decisions.  A negative result may occur with improper specimen collection / handling, submission of specimen other than nasopharyngeal swab, presence of viral mutation(s) within the areas targeted by this assay, and inadequate number of viral copies (<250 copies / mL). A negative result must be combined with clinical observations, patient history, and epidemiological information.  Fact Sheet for Patients:   StrictlyIdeas.no  Fact Sheet for Healthcare Providers: BankingDealers.co.za  This test is not yet approved or  cleared by the [place] FDA and has been authorized for detection and/or diagnosis of SARS-CoV-2 by FDA under an Emergency Use Authorization (EUA).  This EUA will remain in effect (meaning this test can be used) for the duration of  the COVID-19 declaration under Section 564(b)(1) of the Act, 21 U.S.C. section 360bbb-3(b)(1), unless the authorization is terminated or revoked sooner.  Performed at [facility], [address] [place], [place] [address]   Gram stain     Status: None   Collection Time: 03/14/20  2:17 PM   Specimen: Pleura; Body Fluid  Result Value Ref Range Status   Specimen Description PLEURAL  Final   Special Requests NONE  Final   Gram Stain   Final    GRAM POSITIVE COCCI Gram Stain Report Called to,Read Back By and Verified With: CARDWELL,L ON 03/14/20 AT 1640 BY LOY,C WBC PRESENT,BOTH PMN AND MONONUCLEAR CYTOSPIN SMEAR PERFORMED AT [facility] Performed at [facility], [address], [place], [place] [address]    Report Status 03/14/2020 FINAL  Final  Culture, body fluid-bottle     Status: None (Preliminary  result)   Collection Time: 03/14/20  2:48 PM   Specimen: Pleura  Result Value Ref Range Status   Specimen Description PLEURAL  Final   Special Requests 10CC  Final   Gram Stain   Final    GRAM POSITIVE COCCI IN BOTH AEROBIC AND ANAEROBIC BOTTLES Gram Stain Report Called to,Read Back By and Verified With: M DOSS,RN @0031  03/15/20 MKELLY    Culture   Final    NO GROWTH < 24 HOURS Performed at [facility], [address] [place], [place] [address]    Report Status PENDING  Incomplete     Medications:   . predniSONE  20 mg Oral Q breakfast   Continuous Infusions: . sodium chloride 250 mL (03/15/20 0235)  . ceFEPime (MAXIPIME) IV 2 g (03/15/20 0237)  . heparin 1,600 Units/hr (03/14/20 2344)  . vancomycin        LOS: 1 day   Charlynne Cousins  [facility]  03/15/2020, 7:42 AM

## 2020-03-15 NOTE — Consult Note (Addendum)
[address]       [place] [address]             [phone]        Prentis Bartl [facility] Medical Record #[id number] Date of Birth: [birth year]  Referring: Charlynne Cousins, MD Primary Care: [name], MD   Chief Complaint:    Chief Complaint  Patient presents with  . Shortness of Breath    History of Present Illness:     Mr. San is a 69 year old male with a past medical history significant for Covid pneumonia for which she was treated here at [facility] as an inpatient for 12 days.  Hospital course was also complicated by acute DVT of his left lower extremity and has been treated with Eliquis until this admission.  Mr. Pitre was discharged from the hospital on 03/03/2020 on a steroid taper after completing inpatient course of remdesivir.  He was noting gradual improvement in his respiratory status until about 3 days prior to this admission when he began having increasing shortness of breath and right-sided pleuritic pain.  After communication with his primary care provider, he presented to the [facility] emergency department on 03/14/2020.  Chest x-ray showed a large right pleural effusion with evidence of mediastinal shifting.  Thoracentesis was performed by the interventional radiology team yielding about 400 mL of old bloody fluid.  Gram stain of the fluid sample was positive for gram-positive diplococci.  Patient has been started on IV vancomycin and cefepime empirically until speciation is complete.  A CT angiogram was obtained last evening.  This showed no evidence of pulmonary embolus.  There was however a large right-sided pleural effusion with some compressive consolidation in the right lower lobe.  Additionally, there were bilateral patchy airspace opacities, right greater than left.  Currently, Mr. Reece Levy appears acutely short of breath.  His oxygen saturation is in the low 90s on 4 L of oxygen by nasal cannula. We have  been asked to evaluate Mr. Jaggers for consideration of surgical evacuation of his empyema with large right-sided loculated pleural effusion. His last dose of Eliquis was yesterday morning.  Follow-up left lower extremity venous duplex scan was negative for DVT yesterday.  He is currently n.p.o. and on a heparin drip.  Current Activity/ Functional Status:   Zubrod Score: At the time of surgery this patient's most appropriate activity status/level should be described as: []     0    Normal activity, no symptoms []     1    Restricted in physical strenuous activity but ambulatory, able to do out light work []     2    Ambulatory and capable of self care, unable to do work activities, up and about                 more than 50%  Of the time                            [x]     3    Only limited self care, in bed greater than 50% of waking hours []     4    Completely disabled, no self care, confined to bed or chair []     5    Moribund  Past Medical History:  Diagnosis Date  . Cataract 2014   removed  . COVID-19   . DDD (degenerative disc disease), lumbar   . Diverticulosis   .  Headache   . Hypertension   . IBS (irritable bowel syndrome)     Past Surgical History:  Procedure Laterality Date  . cataracts    . COLON SURGERY  1995   fistula  . COLONOSCOPY    . EYE SURGERY  2014   catarats/lens replaced  . mohs  2015   skin cancer from nose  . POLYPECTOMY    . VASECTOMY  1985    Social History   Tobacco Use  Smoking Status Current Some Day Smoker  . Types: Cigars  Smokeless Tobacco Never Used  Tobacco Comment   quit cigarettes in 1973    Social History   Substance and Sexual Activity  Alcohol Use Yes  . Alcohol/week: 6.0 standard drinks  . Types: 2 Cans of beer, 4 Shots of liquor per week     Allergies  Allergen Reactions  . Morphine     Other reaction(s): VOMITING    Current Facility-Administered Medications  Medication Dose Route Frequency Provider Last Rate Last  Admin  . 0.9 %  sodium chloride infusion   Intravenous PRN Emokpae, Ejiroghene E, MD 10 mL/hr at 03/15/20 0235 250 mL at 03/15/20 0235  . acetaminophen (TYLENOL) tablet 650 mg  650 mg Oral Q6H PRN Emokpae, Ejiroghene E, MD   650 mg at 03/15/20 0840   Or  . acetaminophen (TYLENOL) suppository 650 mg  650 mg Rectal Q6H PRN Emokpae, Ejiroghene E, MD      . ceFEPIme (MAXIPIME) 2 g in sodium chloride 0.9 % 100 mL IVPB  2 g Intravenous Q8H Emokpae, Ejiroghene E, MD 200 mL/hr at 03/15/20 0842 2 g at 03/15/20 0842  . heparin ADULT infusion 100 units/mL (25000 units/258mL sodium chloride 0.45%)  1,600 Units/hr Intravenous Continuous Franky Macho, RPH 16 mL/hr at 03/14/20 2344 1,600 Units/hr at 03/14/20 2344  . HYDROmorphone (DILAUDID) injection 0.5 mg  0.5 mg Intravenous Q4H PRN Emokpae, Ejiroghene E, MD      . ondansetron (ZOFRAN) tablet 4 mg  4 mg Oral Q6H PRN Emokpae, Ejiroghene E, MD       Or  . ondansetron (ZOFRAN) injection 4 mg  4 mg Intravenous Q6H PRN Emokpae, Ejiroghene E, MD      . polyethylene glycol (MIRALAX / GLYCOLAX) packet 17 g  17 g Oral Daily PRN Emokpae, Ejiroghene E, MD      . predniSONE (DELTASONE) tablet 20 mg  20 mg Oral Q breakfast Emokpae, Ejiroghene E, MD   20 mg at 03/15/20 0839  . vancomycin (VANCOREADY) IVPB 1750 mg/350 mL  1,750 mg Intravenous Q24H Emokpae, Ejiroghene E, MD        Medications Prior to Admission  Medication Sig Dispense Refill Last Dose  . acetaminophen (TYLENOL) 325 MG tablet Take 2 tablets (650 mg total) by mouth every 6 (six) hours as needed for mild pain or headache (fever >/= 101).   Past Week at Unknown time  . [START ON 04/08/2020] apixaban (ELIQUIS) 5 MG TABS tablet Take 1 tablet (5 mg total) by mouth 2 (two) times daily. 60 tablet 1 03/14/2020 at 0830  . celecoxib (CELEBREX) 200 MG capsule TAKE 1 CAPSULE (200 MG TOTAL) BY MOUTH AS NEEDED. (Patient taking differently: Take 200 mg by mouth daily as needed for mild pain. ) 90 capsule 2   . docusate  sodium (COLACE) 100 MG capsule Take 200 mg by mouth daily as needed for mild constipation or moderate constipation.    03/13/2020 at Unknown time  . Glucosamine-Chondroit-Vit C-Mn (GLUCOSAMINE CHONDROITIN  COMPLX) CAPS Take 2 capsules by mouth daily. 1800 mg   Past Week at Unknown time  . Multiple Vitamins-Minerals (OCUVITE PRESERVISION PO) Take 2 tablets by mouth daily.    03/14/2020 at Unknown time  . Omega-3 Fatty Acids (FISH OIL) 1200 MG CAPS Take 2 capsules by mouth daily.   03/14/2020 at Unknown time  . predniSONE (DELTASONE) 20 MG tablet Take 1 tablet (20 mg total) by mouth daily with breakfast for 7 days. 7 tablet 0 03/14/2020 at Unknown time  . traMADol (ULTRAM) 50 MG tablet Take 1 tablet (50 mg total) by mouth every 8 (eight) hours as needed for up to 10 days. 30 tablet 0 03/13/2020 at Unknown time  . zolpidem (AMBIEN) 10 MG tablet Take 0.5-1 tablets (5-10 mg total) by mouth at bedtime as needed. for sleep 30 tablet 3   . clotrimazole-betamethasone (LOTRISONE) cream Apply 1 application topically 2 (two) times daily. (Patient not taking: Reported on 03/14/2020) 30 g 0 Not Taking at Unknown time  . fluticasone (FLONASE) 50 MCG/ACT nasal spray Place into both nostrils as needed for allergies or rhinitis. (Patient not taking: Reported on 03/09/2020)   Not Taking at Unknown time  . predniSONE (DELTASONE) 10 MG tablet Take 3 tablets (30 mg total) by mouth daily with breakfast for 7 days. (Patient not taking: Reported on 03/14/2020) 21 tablet 0 Not Taking at Unknown time  . [START ON 03/22/2020] predniSONE (DELTASONE) 10 MG tablet Take 1 tablet (10 mg total) by mouth daily with breakfast for 7 days. (Patient not taking: Reported on 03/09/2020) 7 tablet 0 Not Taking at Unknown time  . sildenafil (VIAGRA) 100 MG tablet Take 0.5-1 tablets (50-100 mg total) by mouth daily as needed for erectile dysfunction. (Patient not taking: Reported on 03/09/2020) 5 tablet 11 Not Taking at Unknown time    Family History    Problem Relation Age of Onset  . Cancer Mother        lung (unknown primary)  . Cancer Father        lung mets to spine  . Cancer Brother        melanoma  . Leukemia Maternal Grandfather   . Stomach cancer Paternal Grandmother   . Colon cancer Neg Hx   . Colon polyps Neg Hx   . Diabetes Neg Hx   . Esophageal cancer Neg Hx   . Rectal cancer Neg Hx      Review of Systems:   ROS     Cardiac Review of Systems: Y or  [    ]= no  Chest Pain [  x  ]  Resting SOB [ x  ] Exertional SOB  [ x ]  Orthopnea [  ]   Pedal Edema [   ]    Palpitations [  ] Syncope  [  ]   Presyncope [   ]  General Review of Systems: [Y] = yes [  ]=no Constitional: recent weight change [  ]; anorexia [  ]; fatigue [  ]; nausea [  ]; night sweats [  ]; fever [  ]; or chills [  ]                                                               Dental: Last  Dentist visit:   Eye : blurred vision [  ]; diplopia [   ]; vision changes [  ];  Amaurosis fugax[  ]; Resp: cough [  ];  wheezing[  ];  hemoptysis[  ]; shortness of breath[x  ]; paroxysmal nocturnal dyspnea[  ]; dyspnea on exertion[x  ]; or orthopnea[  ];  GI:  gallstones[  ], vomiting[  ];  dysphagia[  ]; melena[  ];  hematochezia [  ]; heartburn[  ];   Hx of  Colonoscopy[  ]; GU: kidney stones [  ]; hematuria[  ];   dysuria [  ];  nocturia[  ];  history of     obstruction [  ]; urinary frequency [  ]             Skin: rash, swelling[  ];, hair loss[  ];  peripheral edema[  ];  or itching[  ]; Musculosketetal: myalgias[  ];  joint swelling[  ];  joint erythema[  ];  joint pain[  ];  back pain[  ];  Heme/Lymph: bruising[  ];  bleeding[  ];  anemia[  ];  Neuro: TIA[  ];  headaches[  ];  stroke[  ];  vertigo[  ];  seizures[  ];   paresthesias[  ];  difficulty walking[  ];  Psych:depression[  ]; anxiety[  ];  Endocrine: diabetes[  ];  thyroid dysfunction[  ];           Physical Exam: BP (!) 145/81 (BP Location: Left Arm)   Pulse 93   Temp 97.9 F (36.6 C)  (Oral)   Resp 18   Ht 6\' 1"  (1.854 m)   Wt 112 kg   SpO2 93%   BMI 32.58 kg/m    General appearance: alert, cooperative, moderate distress and appears moderately anxious and is short of breath while attempting to talk.  Head: Normocephalic, without obvious abnormality, atraumatic Neck: no adenopathy, no carotid bruit, no JVD and supple, symmetrical, trachea midline Lymph nodes: Cervical, supraclavicular, and axillary nodes normal. and No obvious adenopathy. Resp: breath sounds are diminished bilaterally, nearly absent on the right. He is tachypneic. Cardio: RRR, HR in 90's. GI: Soft and non-tender Extremities: All warm and well-perfused with palpable distal pulses.  He has no calf tenderness. Neurologic: Alert and oriented x4, no gross motor or sensory deficits.  Diagnostic Studies & Laboratory data:     Recent Radiology Findings:    CHEST  1 VIEW  COMPARISON:  [facility] chest radiograph 1451 hours compared to earlier study of 1254 hours  FINDINGS: Enlargement of cardiac silhouette.  BILATERAL pulmonary infiltrates consistent with history of recent COVID pneumonia.  Again identified moderate to large RIGHT pleural effusion, accentuated by expiratory technique.  Significant RIGHT basilar atelectasis.  No pneumothorax following thoracentesis.  IMPRESSION: No pneumothorax following RIGHT thoracentesis.  Persistent RIGHT pleural effusion/hemothorax, RIGHT basilar atelectasis, and BILATERAL infiltrates.   Electronically Signed   By: Lavonia Dana M.D.   On: 03/14/2020 15:28     CT ANGIOGRAPHY CHEST WITH CONTRAST  TECHNIQUE: Multidetector CT imaging of the chest was performed using the standard protocol during bolus administration of intravenous contrast. Multiplanar CT image reconstructions and MIPs were obtained to evaluate the vascular anatomy.  CONTRAST:  173mL OMNIPAQUE IOHEXOL 350 MG/ML SOLN  COMPARISON:  Chest x-ray from earlier in the  same day.  FINDINGS: Cardiovascular: Thoracic aorta demonstrates mild atherosclerotic calcification without aneurysmal dilatation or dissection. No significant cardiac enlargement is seen. Scattered coronary calcifications are noted.  The pulmonary artery shows a normal branching pattern. Motion artifact limits evaluation of the lower lobe branches. No definitive intraluminal filling defect to suggest pulmonary embolism is noted.  Mediastinum/Nodes: Thoracic inlet is within normal limits. Scattered small hilar and mediastinal lymph nodes are noted likely reactive in nature. The esophagus as visualized is within normal limits.  Lungs/Pleura: Patchy peripheral infiltrates are identified within both lungs consistent with the given clinical history of COVID-19 positivity. Additionally there is consolidation in the right lower lobe likely of a more compressive affect given the large right-sided pleural effusion. This persists despite recent thoracentesis. No pneumothorax is noted.  Upper Abdomen: Visualized upper abdomen shows no acute abnormality.  Musculoskeletal: No chest wall abnormality. No acute or significant osseous findings.  Review of the MIP images confirms the above findings.  IMPRESSION: Patchy bilateral airspace opacities consistent with the given clinical history of COVID-19 positivity.  Large right-sided pleural effusion despite recent thoracentesis. This causes some compressive consolidation particularly in the right lower lobe. No pneumothorax is noted.  No evidence of pulmonary emboli.  Aortic Atherosclerosis (ICD10-I70.0).   Electronically Signed   By: Inez Catalina M.D.   On: 03/14/2020 18:57   Left LOWER EXTREMITY VENOUS DOPPLER ULTRASOUND  TECHNIQUE: Gray-scale sonography with compression, as well as color and duplex ultrasound, were performed to evaluate the deep venous system(s) from the level of the common femoral vein through the  popliteal and proximal calf veins.  COMPARISON:  None.  FINDINGS: VENOUS  Normal compressibility of the common femoral, superficial femoral, and popliteal veins, as well as the visualized calf veins. Visualized portions of profunda femoral vein and great saphenous vein unremarkable. No filling defects to suggest DVT on grayscale or color Doppler imaging. Doppler waveforms show normal direction of venous flow, normal respiratory plasticity and response to augmentation.  Limited views of the contralateral common femoral vein are unremarkable.  OTHER  None.  Limitations: none  IMPRESSION: Negative.   Electronically Signed   By: Donavan Foil M.D.   On: 03/14/2020 17:53   ULTRASOUND GUIDED DIAGNOSTIC AND THERAPEUTIC RIGHT THORACENTESIS  MEDICATIONS: None.  COMPLICATIONS: None immediate.  PROCEDURE: An ultrasound guided thoracentesis was thoroughly discussed with the patient and questions answered. The benefits, risks, alternatives and complications were also discussed. The patient understands and wishes to proceed with the procedure. Written consent was obtained.  Ultrasound was performed to localize and mark an adequate pocket of fluid in the posterior RIGHT chest. The area was then prepped and draped in the normal sterile fashion. 1% Lidocaine was used for local anesthesia. Under ultrasound guidance a 8 French thoracentesis catheter was introduced. Thoracentesis was performed. The catheter was removed and a dressing applied.  FINDINGS: A total of approximately 400 mL of thick dark old bloody RIGHT pleural fluid was removed. Samples were sent to the laboratory as requested by the clinical team.  IMPRESSION: Successful ultrasound guided RIGHT thoracentesis yielding 400 mL of dark old bloody pleural fluid.   Electronically Signed   By: Lavonia Dana M.D.   On: 03/14/2020 15:27   I have independently reviewed the above radiologic studies  and discussed with the patient   Recent Lab Findings: Lab Results  Component Value Date   WBC 12.7 (H) 03/14/2020   HGB 13.9 03/14/2020   HCT 42.0 03/14/2020   PLT 225 03/14/2020   GLUCOSE 143 (H) 03/14/2020   CHOL 166 07/14/2019   TRIG 77 07/14/2019   HDL 43 07/14/2019   LDLCALC 106 (H) 07/14/2019   ALT  84 (H) 03/14/2020   AST 66 (H) 03/14/2020   NA 133 (L) 03/14/2020   K 4.2 03/14/2020   CL 97 (L) 03/14/2020   CREATININE 0.59 (L) 03/14/2020   BUN 11 03/14/2020   CO2 27 03/14/2020   TSH 1.800 05/15/2017      Assessment / Plan:    Mr. Bozard is re-admitted with acute shortness of breath and right tension hydrothorax with empyema and loculated right pleural effusion 2 weeks post discharge for treatment of COVID pneumonia. Thoracentesis by interventional radiology yielded only 473ml of bloody fluid with some relief of symptoms.  We have been asked to evaluate Mr. Mercadel for recommendations for further management. Case reviewed with Dr.Shireen Rayburn. Patient has significant risk for general anesthesia presently. Recommend initial approach with pigtail catheter and lytic therapy. We will continue to follow and consider surgical interventions if necessary.   I  spent 25 minutes counseling the patient face to face.   Antony Odea, PA-C [phone]  03/15/2020 10:11 AM    Chart reviewed, patient examined, agree with above. He had right thoracentesis yesterday at [facility] removing 400 cc of bloody fluid and was transferred here. Gram stain of pleural fluid showed G+ cocci. His CT shows a large right pleural effusion that may be loculated but it looks like most of it communicates. There is bilateral patchy airspace opacity as a residual of Covid pneumonia. He has been on oxygen at home for persistent hypoxia as well as steroid taper. I agree that he would be at high risk for VATS or thoracotomy under general anesthesia for drainage of right pleural effusion/empyema. He had a pigtail  inserted by IR this afternoon which is within the inferior portion of the collection and has drained 800 cc of thin serosanguinous fluid. Would reassess CXR in am and decide about need for thrombolytic therapy per pulmonary medicine. I would not consider surgery unless this does not significantly improve the effusion and his clinical state.

## 2020-03-15 NOTE — Consult Note (Signed)
Chief Complaint: Patient was seen in consultation today for right empyema/right chest tube placement.  Referring Physician(s): Merlene Pulling)  Supervising Physician: Corrie Mckusick  Patient Status: [facility] - In-pt  History of Present Illness: Ryan Turner is a 69 y.o. male with a past medical history of hypertension, headaches, IBS, diverticulosis, COVID-19 infection 01/2020, cataracts, and DDD. Of note, patient was diagnosed with COVID-19 infection 02/14/2020, and was hospitalized 02/20/2020 to 03/03/2020 for management of COVID-19 pneumonia. He was discharged home 03/03/2020 with outpatient pulmonology follow-up. He presented to [facility] ED 03/14/2020 after being sent by his pulmonologist for right thoracentesis (CXR revealing right pleural effusion). He underwent a right thoracentesis in IR 03/14/2020 by Dr. Thornton Papas yielding 400 mL. He was transferred and admitted to [facility] for further management. CTA chest revealed large right pleural effusion despite recent thoracentesis. TCTS was consulted who recommended IR consult for possible right chest tube prior to lytic therapy administration.  CTA chest 03/14/2020: 1. Patchy bilateral airspace opacities consistent with the given clinical history of COVID-19 positivity. 2. Large right-sided pleural effusion despite recent thoracentesis. This causes some compressive consolidation particularly in the right lower lobe. No pneumothorax is noted. 3. No evidence of pulmonary emboli. 4. Aortic Atherosclerosis (ICD10-I70.0).  IR consulted by Enid Cutter, PA-C for possible image-guided right chest tube placement. Patient awake and alert sitting in bed watching TV. Complains of dyspnea, stable since admission. Denies fever, chills, chest pain, abdominal pain, or headache.   Past Medical History:  Diagnosis Date  . Cataract 2014   removed  . COVID-19   . DDD (degenerative disc disease), lumbar   . Diverticulosis   . Headache   . Hypertension     . IBS (irritable bowel syndrome)     Past Surgical History:  Procedure Laterality Date  . cataracts    . COLON SURGERY  1995   fistula  . COLONOSCOPY    . EYE SURGERY  2014   catarats/lens replaced  . mohs  2015   skin cancer from nose  . POLYPECTOMY    . VASECTOMY  1985    Allergies: Morphine  Medications: Prior to Admission medications   Medication Sig Start Date End Date Taking? Authorizing Provider  acetaminophen (TYLENOL) 325 MG tablet Take 2 tablets (650 mg total) by mouth every 6 (six) hours as needed for mild pain or headache (fever >/= 101). 03/03/20  Yes Cherene Altes, MD  apixaban (ELIQUIS) 5 MG TABS tablet Take 1 tablet (5 mg total) by mouth 2 (two) times daily. 04/08/20  Yes Cherene Altes, MD  celecoxib (CELEBREX) 200 MG capsule TAKE 1 CAPSULE (200 MG TOTAL) BY MOUTH AS NEEDED. Patient taking differently: Take 200 mg by mouth daily as needed for mild pain.  01/04/19  Yes [name], MD  docusate sodium (COLACE) 100 MG capsule Take 200 mg by mouth daily as needed for mild constipation or moderate constipation.    Yes [provider]  Glucosamine-Chondroit-Vit C-Mn (GLUCOSAMINE CHONDROITIN COMPLX) CAPS Take 2 capsules by mouth daily. 1800 mg   Yes [provider]  Multiple Vitamins-Minerals (OCUVITE PRESERVISION PO) Take 2 tablets by mouth daily.    Yes [provider]  Omega-3 Fatty Acids (FISH OIL) 1200 MG CAPS Take 2 capsules by mouth daily.   Yes [provider]  predniSONE (DELTASONE) 20 MG tablet Take 1 tablet (20 mg total) by mouth daily with breakfast for 7 days. 03/15/20 03/22/20 Yes Cherene Altes, MD  traMADol (ULTRAM) 50 MG  tablet Take 1 tablet (50 mg total) by mouth every 8 (eight) hours as needed for up to 10 days. 03/13/20 03/23/20 Yes [name], MD  zolpidem (AMBIEN) 10 MG tablet Take 0.5-1 tablets (5-10 mg total) by mouth at bedtime as needed. for sleep 11/04/19  Yes [name], MD   clotrimazole-betamethasone (LOTRISONE) cream Apply 1 application topically 2 (two) times daily. Patient not taking: Reported on 03/14/2020 03/09/20   [name], MD  fluticasone [facility]) 50 MCG/ACT nasal spray Place into both nostrils as needed for allergies or rhinitis. Patient not taking: Reported on 03/09/2020    [provider]  predniSONE (DELTASONE) 10 MG tablet Take 3 tablets (30 mg total) by mouth daily with breakfast for 7 days. Patient not taking: Reported on 03/14/2020 03/08/20 03/15/20  Cherene Altes, MD  predniSONE (DELTASONE) 10 MG tablet Take 1 tablet (10 mg total) by mouth daily with breakfast for 7 days. Patient not taking: Reported on 03/09/2020 03/22/20 03/29/20  Cherene Altes, MD  sildenafil (VIAGRA) 100 MG tablet Take 0.5-1 tablets (50-100 mg total) by mouth daily as needed for erectile dysfunction. Patient not taking: Reported on 03/09/2020 07/14/19   [name], MD     Family History  Problem Relation Age of Onset  . Cancer Mother        lung (unknown primary)  . Cancer Father        lung mets to spine  . Cancer Brother        melanoma  . Leukemia Maternal Grandfather   . Stomach cancer Paternal Grandmother   . Colon cancer Neg Hx   . Colon polyps Neg Hx   . Diabetes Neg Hx   . Esophageal cancer Neg Hx   . Rectal cancer Neg Hx     Social History   Socioeconomic History  . Marital status: Married    Spouse name: Not on file  . Number of children: Not on file  . Years of education: College  . Highest education level: Not on file  Occupational History  . Occupation: Teacher, music  Tobacco Use  . Smoking status: Current Some Day Smoker    Types: Cigars  . Smokeless tobacco: Never Used  . Tobacco comment: quit cigarettes in 1973  Vaping Use  . Vaping Use: Never used  Substance and Sexual Activity  . Alcohol use: Yes    Alcohol/week: 6.0 standard drinks    Types: 2 Cans of beer, 4 Shots of liquor per week  . Drug use: No  . Sexual  activity: Yes    Birth control/protection: None  Other Topics Concern  . Not on file  Social History Narrative   Lives at home with wife.   Social Determinants of Health   Financial Resource Strain:   . Difficulty of Paying Living Expenses: Not on file  Food Insecurity:   . Worried About Charity fundraiser in the Last Year: Not on file  . Ran Out of Food in the Last Year: Not on file  Transportation Needs:   . Lack of Transportation (Medical): Not on file  . Lack of Transportation (Non-Medical): Not on file  Physical Activity:   . Days of Exercise per Week: Not on file  . Minutes of Exercise per Session: Not on file  Stress:   . Feeling of Stress : Not on file  Social Connections:   . Frequency of Communication with Friends and Family: Not on file  . Frequency of Social Gatherings  with Friends and Family: Not on file  . Attends Religious Services: Not on file  . Active Member of Clubs or Organizations: Not on file  . Attends Archivist Meetings: Not on file  . Marital Status: Not on file     Review of Systems: A 12 point ROS discussed and pertinent positives are indicated in the HPI above.  All other systems are negative.  Review of Systems  Constitutional: Negative for chills and fever.  Respiratory: Positive for shortness of breath. Negative for wheezing.   Cardiovascular: Negative for chest pain and palpitations.  Gastrointestinal: Negative for abdominal pain.  Neurological: Negative for headaches.  Psychiatric/Behavioral: Negative for behavioral problems and confusion.    Vital Signs: BP (!) 162/83 (BP Location: Right Arm)   Pulse 96   Temp 98.7 F (37.1 C) (Oral)   Resp 18   Ht 6\' 1"  (1.854 m)   Wt 246 lb 14.6 oz (112 kg)   SpO2 93%   BMI 32.58 kg/m   Physical Exam Vitals and nursing note reviewed.  Constitutional:      General: He is not in acute distress.    Appearance: Normal appearance.  Cardiovascular:     Rate and Rhythm: Normal rate  and regular rhythm.     Heart sounds: Normal heart sounds. No murmur heard.   Pulmonary:     Effort: No respiratory distress.     Breath sounds: Normal breath sounds. No wheezing.     Comments: Tachypneic. Skin:    General: Skin is warm and dry.  Neurological:     Mental Status: He is alert and oriented to person, place, and time.      MD Evaluation Airway: WNL Heart: WNL Abdomen: WNL Chest/ Lungs: WNL ASA  Classification: 3 Mallampati/Airway Score: Two   Imaging: DG Chest 1 View  Result Date: 03/14/2020 CLINICAL DATA:  RIGHT pneumothorax post thoracentesis EXAM: CHEST  1 VIEW COMPARISON:  Extra tori AP chest radiograph 1451 hours compared to earlier study of 1254 hours FINDINGS: Enlargement of cardiac silhouette. BILATERAL pulmonary infiltrates consistent with history of recent COVID pneumonia. Again identified moderate to large RIGHT pleural effusion, accentuated by expiratory technique. Significant RIGHT basilar atelectasis. No pneumothorax following thoracentesis. IMPRESSION: No pneumothorax following RIGHT thoracentesis. Persistent RIGHT pleural effusion/hemothorax, RIGHT basilar atelectasis, and BILATERAL infiltrates. Electronically Signed   By: Lavonia Dana M.D.   On: 03/14/2020 15:28   DG Chest 2 View  Result Date: 03/10/2020 CLINICAL DATA:  Dyspnea, abnormal chest auscultation EXAM: CHEST - 2 VIEW COMPARISON:  02/28/2020 FINDINGS: Since the prior examination, moderate right pleural effusion has developed with mild resultant right-sided volume loss. Extensive superimposed pulmonary infiltrate persists, in keeping with acute atypical infection. Infiltrate within the left lung has improved slightly since prior examination. No pneumothorax. Cardiac size within normal limits. No acute bone abnormality. IMPRESSION: 1. New moderate right pleural effusion with mild resultant right-sided volume loss. 2. Extensive superimposed pulmonary infiltrate persists, in keeping with acute atypical  infection, improving. Electronically Signed   By: Fidela Salisbury MD   On: 03/10/2020 23:02   CT Angio Chest PE W and/or Wo Contrast  Result Date: 03/14/2020 CLINICAL DATA:  Shortness of breath, COVID-19 positivity, recent right thoracentesis EXAM: CT ANGIOGRAPHY CHEST WITH CONTRAST TECHNIQUE: Multidetector CT imaging of the chest was performed using the standard protocol during bolus administration of intravenous contrast. Multiplanar CT image reconstructions and MIPs were obtained to evaluate the vascular anatomy. CONTRAST:  164mL OMNIPAQUE IOHEXOL 350 MG/ML SOLN COMPARISON:  Chest x-ray from earlier in the same day. FINDINGS: Cardiovascular: Thoracic aorta demonstrates mild atherosclerotic calcification without aneurysmal dilatation or dissection. No significant cardiac enlargement is seen. Scattered coronary calcifications are noted. The pulmonary artery shows a normal branching pattern. Motion artifact limits evaluation of the lower lobe branches. No definitive intraluminal filling defect to suggest pulmonary embolism is noted. Mediastinum/Nodes: Thoracic inlet is within normal limits. Scattered small hilar and mediastinal lymph nodes are noted likely reactive in nature. The esophagus as visualized is within normal limits. Lungs/Pleura: Patchy peripheral infiltrates are identified within both lungs consistent with the given clinical history of COVID-19 positivity. Additionally there is consolidation in the right lower lobe likely of a more compressive affect given the large right-sided pleural effusion. This persists despite recent thoracentesis. No pneumothorax is noted. Upper Abdomen: Visualized upper abdomen shows no acute abnormality. Musculoskeletal: No chest wall abnormality. No acute or significant osseous findings. Review of the MIP images confirms the above findings. IMPRESSION: Patchy bilateral airspace opacities consistent with the given clinical history of COVID-19 positivity. Large right-sided  pleural effusion despite recent thoracentesis. This causes some compressive consolidation particularly in the right lower lobe. No pneumothorax is noted. No evidence of pulmonary emboli. Aortic Atherosclerosis (ICD10-I70.0). Electronically Signed   By: Inez Catalina M.D.   On: 03/14/2020 18:57   US Venous Img Lower Unilateral Left (DVT)  Result Date: 03/14/2020 CLINICAL DATA:  Shortness of breath EXAM: Left LOWER EXTREMITY VENOUS DOPPLER ULTRASOUND TECHNIQUE: Gray-scale sonography with compression, as well as color and duplex ultrasound, were performed to evaluate the deep venous system(s) from the level of the common femoral vein through the popliteal and proximal calf veins. COMPARISON:  None. FINDINGS: VENOUS Normal compressibility of the common femoral, superficial femoral, and popliteal veins, as well as the visualized calf veins. Visualized portions of profunda femoral vein and great saphenous vein unremarkable. No filling defects to suggest DVT on grayscale or color Doppler imaging. Doppler waveforms show normal direction of venous flow, normal respiratory plasticity and response to augmentation. Limited views of the contralateral common femoral vein are unremarkable. OTHER None. Limitations: none IMPRESSION: Negative. Electronically Signed   By: Donavan Foil M.D.   On: 03/14/2020 17:53   DG Chest Port 1 View  Result Date: 03/14/2020 CLINICAL DATA:  Increasing shortness of breath.  COVID positive. EXAM: PORTABLE CHEST 1 VIEW COMPARISON:  03/10/2020 FINDINGS: Interval increase in the size of a layering right pleural effusion, now large. No substantial left pleural effusion. Increased overlying opacities in the right lung. Cardiopericardial silhouette is similar to prior, but partially obscured. No discernible pneumothorax. Improved opacities in the left lung with improved aeration of the left lung. IMPRESSION: Interval increase in the size of a layering right pleural effusion, now large. Increased  overlying opacities in the right lung may represent atelectasis, aspiration, and/or pneumonia. Improved aeration of the left lung. Electronically Signed   By: Margaretha Sheffield MD   On: 03/14/2020 13:10   DG Chest Port 1 View  Result Date: 02/28/2020 CLINICAL DATA:  Short of breath.  COVID positive 1 week ago. EXAM: PORTABLE CHEST 1 VIEW COMPARISON:  02/20/2020 FINDINGS: Diffuse bilateral interstitial and airspace densities are again identified. When compared with the previous exam the overall aeration of lungs is unchanged. No pneumothorax visualized. IMPRESSION: 1. Persistent bilateral interstitial and airspace densities within the lungs compatible with atypical viral pneumonia. 2. No change in aeration to the lungs. Electronically Signed   By: Kerby Moors M.D.   On: 02/28/2020 07:56  DG Chest Port 1 View  Result Date: 02/20/2020 CLINICAL DATA:  Shortness of breath.  COVID positive 1 week ago. EXAM: PORTABLE CHEST 1 VIEW COMPARISON:  None. FINDINGS: Cardiomegaly with normal mediastinal contours. Moderate to advanced heterogeneous bilateral lung opacities in a mid lower lung zone predominant distribution. No evidence of pneumothorax and pneumomediastinum. No large pleural effusion. No acute osseous abnormalities are seen. IMPRESSION: Moderate to advanced bilateral heterogeneous lung opacities consistent with COVID pneumonia. Electronically Signed   By: Keith Rake M.D.   On: 02/20/2020 21:13   VAS [place] LOWER EXTREMITY VENOUS (DVT)  Result Date: 02/22/2020  Lower Venous DVTStudy Indications: Elevated d-dimer, and Edema.  Comparison Study: Prior study 09-23-2016 LEV RT Performing Technologist: Darlin Coco  Examination Guidelines: A complete evaluation includes B-mode imaging, spectral Doppler, color Doppler, and power Doppler as needed of all accessible portions of each vessel. Bilateral testing is considered an integral part of a complete examination. Limited examinations for reoccurring indications  may be performed as noted. The reflux portion of the exam is performed with the patient in reverse Trendelenburg.  +---------+---------------+---------+-----------+----------+--------------+ RIGHT    CompressibilityPhasicitySpontaneityPropertiesThrombus Aging +---------+---------------+---------+-----------+----------+--------------+ CFV      Full           Yes      Yes                                 +---------+---------------+---------+-----------+----------+--------------+ SFJ      Full                                                        +---------+---------------+---------+-----------+----------+--------------+ FV Prox  Full                                                        +---------+---------------+---------+-----------+----------+--------------+ FV Mid   Full                                                        +---------+---------------+---------+-----------+----------+--------------+ FV DistalFull                                                        +---------+---------------+---------+-----------+----------+--------------+ PFV      Full                                                        +---------+---------------+---------+-----------+----------+--------------+ POP      Full           Yes      Yes                                 +---------+---------------+---------+-----------+----------+--------------+  PTV      Full                                                        +---------+---------------+---------+-----------+----------+--------------+ PERO     Full                                                        +---------+---------------+---------+-----------+----------+--------------+   +---------+---------------+---------+-----------+----------+--------------+ LEFT     CompressibilityPhasicitySpontaneityPropertiesThrombus Aging +---------+---------------+---------+-----------+----------+--------------+ CFV       Full           Yes      Yes                                 +---------+---------------+---------+-----------+----------+--------------+ SFJ      Full                                                        +---------+---------------+---------+-----------+----------+--------------+ FV Prox  Full                                                        +---------+---------------+---------+-----------+----------+--------------+ FV Mid   Full                                                        +---------+---------------+---------+-----------+----------+--------------+ FV DistalFull                                                        +---------+---------------+---------+-----------+----------+--------------+ PFV      Full                                                        +---------+---------------+---------+-----------+----------+--------------+ POP      Full           Yes      Yes                                 +---------+---------------+---------+-----------+----------+--------------+ PTV      Partial                                                     +---------+---------------+---------+-----------+----------+--------------+  PERO     Partial                                                     +---------+---------------+---------+-----------+----------+--------------+     Summary: RIGHT: - There is no evidence of deep vein thrombosis in the lower extremity.  - No cystic structure found in the popliteal fossa.  LEFT: - Findings consistent with acute deep vein thrombosis involving the left posterior tibial veins, and left peroneal veins. - No cystic structure found in the popliteal fossa.  *See table(s) above for measurements and observations. Electronically signed by Deitra Mayo MD on 02/22/2020 at 8:08:47 PM.    Final    US THORACENTESIS ASP PLEURAL SPACE W/IMG GUIDE  Result Date: 03/14/2020 INDICATION: RIGHT pleural effusion EXAM:  ULTRASOUND GUIDED DIAGNOSTIC AND THERAPEUTIC RIGHT THORACENTESIS MEDICATIONS: None. COMPLICATIONS: None immediate. PROCEDURE: An ultrasound guided thoracentesis was thoroughly discussed with the patient and questions answered. The benefits, risks, alternatives and complications were also discussed. The patient understands and wishes to proceed with the procedure. Written consent was obtained. Ultrasound was performed to localize and mark an adequate pocket of fluid in the posterior RIGHT chest. The area was then prepped and draped in the normal sterile fashion. 1% Lidocaine was used for local anesthesia. Under ultrasound guidance a 8 French thoracentesis catheter was introduced. Thoracentesis was performed. The catheter was removed and a dressing applied. FINDINGS: A total of approximately 400 mL of thick dark old bloody RIGHT pleural fluid was removed. Samples were sent to the laboratory as requested by the clinical team. IMPRESSION: Successful ultrasound guided RIGHT thoracentesis yielding 400 mL of dark old bloody pleural fluid. Electronically Signed   By: Lavonia Dana M.D.   On: 03/14/2020 15:27    Labs:  CBC: Recent Labs    03/03/20 0541 03/09/20 1659 03/14/20 1247 03/15/20 1035  WBC 7.6 10.8 12.7* 9.0  HGB 14.4 15.5 13.9 12.6*  HCT 43.8 45.0 42.0 38.1*  PLT 141* 174 225 218    COAGS: Recent Labs    03/15/20 0003 03/15/20 1035  APTT 42* 62*    BMP: Recent Labs    03/02/20 1135 03/03/20 0541 03/09/20 1659 03/14/20 1247  NA 132* 136 139 133*  K 3.9 4.3 5.6* 4.2  CL 94* 97* 102 97*  CO2 30 30 26 27   GLUCOSE 133* 93 135* 143*  BUN 15 23 18 11   CALCIUM 8.0* 8.2* 9.0 8.5*  CREATININE 0.87 1.08 0.64* 0.59*  GFRNONAA >60 >60 99 >60  GFRAA >60 >60 114 >60    LIVER FUNCTION TESTS: Recent Labs    03/01/20 0147 03/01/20 0147 03/02/20 1135 03/03/20 0541 03/09/20 1659 03/14/20 1247  BILITOT 0.7   < > 0.8 0.8 0.5 0.7  AST 29   < > 20 24 25  66*  ALT 60*   < > 42 43 55* 84*   ALKPHOS 105  --  77 70  --  123  PROT 4.8*   < > 4.6* 4.6* 5.7* 6.5  ALBUMIN 2.4*  --  2.3* 2.2*  --  2.7*   < > = values in this interval not displayed.     Assessment and Plan:  Right empyema/loculated right pleural effusion. Plan for image-guided right chest tube today in IR. Per TCTS, patient to undergo lytic therapy following tube placement. Patient is NPO. Afebrile.  Risks and benefits discussed with the patient including bleeding, infection, damage to adjacent structures, pneumothorax, and sepsis. All of the patient's questions were answered, patient is agreeable to proceed. Consent signed and in chart.   Thank you for this interesting consult.  I greatly enjoyed meeting Jacobey Gura and look forward to participating in their care.  A copy of this report was sent to the requesting provider on this date.  Electronically Signed: Earley Abide, PA-C 03/15/2020, 1:12 PM   I spent a total of 40 Minutes in face to face in clinical consultation, greater than 50% of which was counseling/coordinating care for right empyema/right chest tube placement.

## 2020-03-15 NOTE — Telephone Encounter (Signed)
Appt has been made for Mr. Proby.

## 2020-03-15 NOTE — Progress Notes (Signed)
ANTICOAGULATION CONSULT NOTE - Initial Consult  Pharmacy Consult for IV Heparin Indication: recent DVT  Allergies  Allergen Reactions  . Morphine     Other reaction(s): VOMITING    Patient Measurements: Height: 6\' 1"  (185.4 cm) Weight: 112 kg (246 lb 14.6 oz) IBW/kg (Calculated) : 79.9 Heparin Dosing Weight: 105 kg  Vital Signs: Temp: 98.7 F (37.1 C) (08/25 1129) Temp Source: Oral (08/25 1129) BP: 162/83 (08/25 1129) Pulse Rate: 96 (08/25 1129)  Labs: Recent Labs    03/14/20 1247 03/15/20 0003 03/15/20 1035  HGB 13.9  --  12.6*  HCT 42.0  --  38.1*  PLT 225  --  218  APTT  --  42* 62*  HEPARINUNFRC  --  0.88*  --   CREATININE 0.59*  --   --     Estimated Creatinine Clearance: 111 mL/min (A) (by C-G formula based on SCr of 0.59 mg/dL (L)).   Medical History: Past Medical History:  Diagnosis Date  . Cataract 2014   removed  . COVID-19   . DDD (degenerative disc disease), lumbar   . Diverticulosis   . Headache   . Hypertension   . IBS (irritable bowel syndrome)     Medications:  Infusions:  . sodium chloride 250 mL (03/15/20 0235)  . ceFEPime (MAXIPIME) IV 2 g (03/15/20 0842)  . heparin 1,600 Units/hr (03/14/20 2344)  . vancomycin      Assessment: 69 years of age male on Apixaban prior to admission (last dose 03/14/20 at 0830) for recent DVT now transitioned to IV Heparin for possible procedures.   APTT up to 62 on 1600 units/hr. H/H down slightly this AM. Platelets stable. No bleeding noted. Will increase the rate and recheck.   Goal of Therapy:  Heparin level 0.3-0.7 units/ml aPTT 66-102 seconds Monitor platelets by anticoagulation protocol: Yes   Plan:  Increase IV Heparin to 1800 units/hr Recheck aPTT in 8 hours.  Daily aPTT, HL, and CBC while on therapy.   Sloan Leiter, PharmD, BCPS, BCCCP Clinical Pharmacist Please refer to [facility] for [employer] numbers 03/15/2020,11:33 AM

## 2020-03-15 NOTE — ED Notes (Signed)
Date and time results received: 03/15/20 0030 (use smartphrase ".now" to insert current time)  Test: blood culture Critical Value: gram positive cocci  Name of Provider Notified: Dr Clearence Ped  Orders Received? Or Actions Taken?: Actions Taken: no orders received

## 2020-03-16 ENCOUNTER — Inpatient Hospital Stay (HOSPITAL_COMMUNITY): Payer: BC Managed Care – PPO

## 2020-03-16 ENCOUNTER — Ambulatory Visit: Payer: BC Managed Care – PPO | Admitting: Family Medicine

## 2020-03-16 DIAGNOSIS — L8990 Pressure ulcer of unspecified site, unspecified stage: Secondary | ICD-10-CM | POA: Insufficient documentation

## 2020-03-16 LAB — CBC WITH DIFFERENTIAL/PLATELET
Abs Immature Granulocytes: 0.11 10*3/uL — ABNORMAL HIGH (ref 0.00–0.07)
Basophils Absolute: 0 10*3/uL (ref 0.0–0.1)
Basophils Relative: 0 %
Eosinophils Absolute: 0.1 10*3/uL (ref 0.0–0.5)
Eosinophils Relative: 1 %
HCT: 38.4 % — ABNORMAL LOW (ref 39.0–52.0)
Hemoglobin: 12.7 g/dL — ABNORMAL LOW (ref 13.0–17.0)
Immature Granulocytes: 1 %
Lymphocytes Relative: 13 %
Lymphs Abs: 1.1 10*3/uL (ref 0.7–4.0)
MCH: 31.8 pg (ref 26.0–34.0)
MCHC: 33.1 g/dL (ref 30.0–36.0)
MCV: 96.2 fL (ref 80.0–100.0)
Monocytes Absolute: 0.8 10*3/uL (ref 0.1–1.0)
Monocytes Relative: 8 %
Neutro Abs: 7 10*3/uL (ref 1.7–7.7)
Neutrophils Relative %: 77 %
Platelets: 244 10*3/uL (ref 150–400)
RBC: 3.99 MIL/uL — ABNORMAL LOW (ref 4.22–5.81)
RDW: 14.4 % (ref 11.5–15.5)
WBC: 9.1 10*3/uL (ref 4.0–10.5)
nRBC: 0 % (ref 0.0–0.2)

## 2020-03-16 LAB — BASIC METABOLIC PANEL
Anion gap: 9 (ref 5–15)
BUN: 13 mg/dL (ref 8–23)
CO2: 28 mmol/L (ref 22–32)
Calcium: 8.2 mg/dL — ABNORMAL LOW (ref 8.9–10.3)
Chloride: 96 mmol/L — ABNORMAL LOW (ref 98–111)
Creatinine, Ser: 0.84 mg/dL (ref 0.61–1.24)
GFR calc Af Amer: >60 mL/min (ref >60)
GFR calc non Af Amer: >60 mL/min (ref >60)
Glucose, Bld: 124 mg/dL — ABNORMAL HIGH (ref 70–99)
Potassium: 4.3 mmol/L (ref 3.5–5.1)
Sodium: 133 mmol/L — ABNORMAL LOW (ref 135–145)

## 2020-03-16 LAB — CYTOLOGY - NON PAP

## 2020-03-16 LAB — HEPARIN LEVEL (UNFRACTIONATED): Heparin Unfractionated: 0.45 IU/mL (ref 0.30–0.70)

## 2020-03-16 LAB — APTT: aPTT: 70 seconds — ABNORMAL HIGH (ref 24–36)

## 2020-03-16 MED ORDER — SODIUM CHLORIDE (PF) 0.9 % IJ SOLN
10.0000 mg | Freq: Once | INTRAMUSCULAR | Status: AC
  Administered 2020-03-16: 10 mg via INTRAPLEURAL
  Filled 2020-03-16: qty 10

## 2020-03-16 MED ORDER — STERILE WATER FOR INJECTION IJ SOLN
5.0000 mg | Freq: Once | RESPIRATORY_TRACT | Status: AC
  Administered 2020-03-16: 5 mg via INTRAPLEURAL
  Filled 2020-03-16: qty 5

## 2020-03-16 NOTE — Progress Notes (Signed)
Ryan Turner for IV Heparin Indication: recent DVT  Allergies  Allergen Reactions  . Morphine     Other reaction(s): VOMITING    Patient Measurements: Height: 6\' 1"  (185.4 cm) Weight: 112 kg (246 lb 14.6 oz) IBW/kg (Calculated) : 79.9 Heparin Dosing Weight: 105 kg  Vital Signs: Temp: 98 F (36.7 C) (08/25 2326) Temp Source: Oral (08/25 2326) BP: 172/85 (08/25 2326) Pulse Rate: 93 (08/25 2326)  Labs: Recent Labs    03/14/20 1247 03/14/20 1247 03/15/20 0003 03/15/20 1035 03/16/20 0053  HGB 13.9   < >  --  12.6* 12.7*  HCT 42.0  --   --  38.1* 38.4*  PLT 225  --   --  218 244  APTT  --   --  42* 62* 70*  HEPARINUNFRC  --   --  0.88*  --  0.45  CREATININE 0.59*  --   --   --  0.84   < > = values in this interval not displayed.    Estimated Creatinine Clearance: 105.8 mL/min (by C-G formula based on SCr of 0.84 mg/dL).  Assessment: 69 years of age male on Apixaban prior to admission (last dose 03/14/20 at 0830) for recent DVT now transitioned to IV Heparin for possible procedures.   PTT 70 sec (therapeutic), heparin level 0.45 (also therapeutic but not correlating yet with PTT due to apixaban). No bleeding noted.  Goal of Therapy:  Heparin level 0.3-0.7 units/ml aPTT 66-102 seconds Monitor platelets by anticoagulation protocol: Yes   Plan:  Continue IV Heparin to 1800 units/hr F/u a.m. heparin level and PTT  Sherlon Handing, PharmD, BCPS Please see amion for complete clinical pharmacist phone list 03/16/2020,1:55 AM

## 2020-03-16 NOTE — Progress Notes (Signed)
PCCM progress note  Asked to evaluate patient as he developed right back and shoulder pain with flushing after DNase, TPA administration and drainage. Chest tube has put out about 400 cc of serosanguineous fluid already and continues to drain  Blood pressure (!) 146/85, pulse 100, temperature 98.7 F (37.1 C), temperature source Oral, resp. rate (!) 24  Neck:     No masses; no thyromegaly, flushed face Lungs:    Diminished breath sounds, rhonchi greater on the right. CV:         Regular rate and rhythm; no murmurs Abd:      + bowel sounds; soft, non-tender; no palpable masses, no distension Ext:    No edema; adequate peripheral perfusion Skin:      Warm and dry; no rash Neuro: alert and oriented x 3 Psych: normal mood and affect  Assessment/plan Right-sided staff empyema status post DNase/TPA Suspect chest pain may be related to expansion of lung Chest x-ray reviewed with no pneumothorax and continued improvement of effusion We will give 1 dose Dilaudid for pain Continue monitoring.  Marshell Garfinkel MD [facility] Please see [email or web address] for pager details.  03/16/2020, 5:42 PM

## 2020-03-16 NOTE — Progress Notes (Signed)
PCCM INTERVAL PROGRESS NOTE   After verifying patient name and birth date as well as site. Chest tube stopcock side port was cleansed with chlorhexidine. Alteplase 10mg  and dornase 5mg  administered into the R pleural space via pigtail chest tube. Stopcock turned off to patient. Patient instructed to change position every 20 minutes. RN instructed to open stopcock after one hour at 1525.   Georgann Housekeeper, AGACNP-BC [place]  See Amion for personal pager PCCM on call pager [phone]  03/16/2020 2:34 PM

## 2020-03-16 NOTE — Progress Notes (Signed)
[facility] PROGRESS NOTE    Progress Note  Ryan Turner  TKW:[id number] DOB: [birth year] DOA: 03/14/2020 PCP: [name], MD     Brief Narrative:   Ryan Turner is an 69 y.o. male past medical history significant for recent COVID-19 pneumonia, essential hypertension with a prolonged hospitalization recent discharge on 03/03/2020 for Covid pneumonia complicated by DVT which he completed treatment and was discharged home on steroid taper and Eliquis and 3 L of oxygen.  Main to the ED with increased difficulty breathing and with sats dropping into the 68%, and was diagnosed with empyema in the right pleural space.  Assessment/Plan:   Loculated empyema of right pleural space (HCC) Pigtail catheter inserted on 11/14/2019 that is yield over 1000 L of bloody fluid. She is x-ray show improvement in his pleural effusion. Cultures from fluid grew MRSA he is currently on IV Vanco we will continue cefepime for now. Continue IV heparin and narcotics for pain. Appreciate pulmonary and CT surgery's assistance.  Left lower extremity DVT: IV heparin resume, for discharge we will put him back on his Eliquis.  Elevated blood pressure without a diagnosis of hypertension: She is currently on no antihypertensive medications at home.  History of a recent pneumonia due to COVID-19 virus Discharged on 3 to 6 L of oxygen, continue steroid taper.  DVT (deep venous thrombosis) (HCC) On IV heparin for now.   DVT prophylaxis: heparin Family Communication:none Status is: Inpatient  Remains inpatient appropriate because:Hemodynamically unstable   Dispo: The patient is from: Home              Anticipated d/c is to: Home              Anticipated d/c date is: 2 days              Patient currently is not medically stable to d/c.        Code Status:     Code Status Orders  (From admission, onward)         Start     Ordered   03/14/20 2218  Do not attempt resuscitation  (DNR)  Continuous       Question Answer Comment  In the event of cardiac or respiratory ARREST Do not call a "code blue"   In the event of cardiac or respiratory ARREST Do not perform Intubation, CPR, defibrillation or ACLS   In the event of cardiac or respiratory ARREST Use medication by any route, position, wound care, and other measures to relive pain and suffering. May use oxygen, suction and manual treatment of airway obstruction as needed for comfort.      03/14/20 2217        Code Status History    Date Active Date Inactive Code Status Order ID Comments User Context   02/26/2020 1529 03/04/2020 0009 DNR [id number]  Jonetta Osgood, MD Inpatient   02/20/2020 2230 02/26/2020 1529 Full Code [id number]  Rolla Plate, DO ED   Advance Care Planning Activity    Advance Directive Documentation     Most Recent Value  Type of Advance Directive Living will  Pre-existing out of facility DNR order (yellow form or pink MOST form) --  "MOST" Form in Place? --        IV Access:    Peripheral IV   Procedures and diagnostic studies:   DG Chest 1 View  Result Date: 03/14/2020 CLINICAL DATA:  RIGHT pneumothorax post thoracentesis EXAM: CHEST  1 VIEW COMPARISON:  Extra  tori AP chest radiograph 1451 hours compared to earlier study of 1254 hours FINDINGS: Enlargement of cardiac silhouette. BILATERAL pulmonary infiltrates consistent with history of recent COVID pneumonia. Again identified moderate to large RIGHT pleural effusion, accentuated by expiratory technique. Significant RIGHT basilar atelectasis. No pneumothorax following thoracentesis. IMPRESSION: No pneumothorax following RIGHT thoracentesis. Persistent RIGHT pleural effusion/hemothorax, RIGHT basilar atelectasis, and BILATERAL infiltrates. Electronically Signed   By: Lavonia Dana M.D.   On: 03/14/2020 15:28   CT Angio Chest PE W and/or Wo Contrast  Result Date: 03/14/2020 CLINICAL DATA:  Shortness of breath, COVID-19 positivity,  recent right thoracentesis EXAM: CT ANGIOGRAPHY CHEST WITH CONTRAST TECHNIQUE: Multidetector CT imaging of the chest was performed using the standard protocol during bolus administration of intravenous contrast. Multiplanar CT image reconstructions and MIPs were obtained to evaluate the vascular anatomy. CONTRAST:  160mL OMNIPAQUE IOHEXOL 350 MG/ML SOLN COMPARISON:  Chest x-ray from earlier in the same day. FINDINGS: Cardiovascular: Thoracic aorta demonstrates mild atherosclerotic calcification without aneurysmal dilatation or dissection. No significant cardiac enlargement is seen. Scattered coronary calcifications are noted. The pulmonary artery shows a normal branching pattern. Motion artifact limits evaluation of the lower lobe branches. No definitive intraluminal filling defect to suggest pulmonary embolism is noted. Mediastinum/Nodes: Thoracic inlet is within normal limits. Scattered small hilar and mediastinal lymph nodes are noted likely reactive in nature. The esophagus as visualized is within normal limits. Lungs/Pleura: Patchy peripheral infiltrates are identified within both lungs consistent with the given clinical history of COVID-19 positivity. Additionally there is consolidation in the right lower lobe likely of a more compressive affect given the large right-sided pleural effusion. This persists despite recent thoracentesis. No pneumothorax is noted. Upper Abdomen: Visualized upper abdomen shows no acute abnormality. Musculoskeletal: No chest wall abnormality. No acute or significant osseous findings. Review of the MIP images confirms the above findings. IMPRESSION: Patchy bilateral airspace opacities consistent with the given clinical history of COVID-19 positivity. Large right-sided pleural effusion despite recent thoracentesis. This causes some compressive consolidation particularly in the right lower lobe. No pneumothorax is noted. No evidence of pulmonary emboli. Aortic Atherosclerosis  (ICD10-I70.0). Electronically Signed   By: Inez Catalina M.D.   On: 03/14/2020 18:57   US Venous Img Lower Unilateral Left (DVT)  Result Date: 03/14/2020 CLINICAL DATA:  Shortness of breath EXAM: Left LOWER EXTREMITY VENOUS DOPPLER ULTRASOUND TECHNIQUE: Gray-scale sonography with compression, as well as color and duplex ultrasound, were performed to evaluate the deep venous system(s) from the level of the common femoral vein through the popliteal and proximal calf veins. COMPARISON:  None. FINDINGS: VENOUS Normal compressibility of the common femoral, superficial femoral, and popliteal veins, as well as the visualized calf veins. Visualized portions of profunda femoral vein and great saphenous vein unremarkable. No filling defects to suggest DVT on grayscale or color Doppler imaging. Doppler waveforms show normal direction of venous flow, normal respiratory plasticity and response to augmentation. Limited views of the contralateral common femoral vein are unremarkable. OTHER None. Limitations: none IMPRESSION: Negative. Electronically Signed   By: Donavan Foil M.D.   On: 03/14/2020 17:53   DG CHEST PORT 1 VIEW  Result Date: 03/16/2020 CLINICAL DATA:  Shortness of breath, cough. EXAM: PORTABLE CHEST 1 VIEW COMPARISON:  March 14, 2020. FINDINGS: Stable cardiomegaly. Stable interstitial densities are noted throughout both lungs concerning for pulmonary edema or possibly pneumonia. Interval placement of pigtail drainage catheter into right lung base. Loculated right pleural effusion is slightly smaller in size. No pneumothorax is noted. Bony thorax is  unremarkable. IMPRESSION: Interval placement of pigtail drainage catheter into right lung base. Loculated right pleural effusion is slightly smaller in size. Stable interstitial densities throughout both lungs concerning for pulmonary edema or possibly pneumonia. Electronically Signed   By: Marijo Conception M.D.   On: 03/16/2020 08:10   DG Chest Port 1  View  Result Date: 03/14/2020 CLINICAL DATA:  Increasing shortness of breath.  COVID positive. EXAM: PORTABLE CHEST 1 VIEW COMPARISON:  03/10/2020 FINDINGS: Interval increase in the size of a layering right pleural effusion, now large. No substantial left pleural effusion. Increased overlying opacities in the right lung. Cardiopericardial silhouette is similar to prior, but partially obscured. No discernible pneumothorax. Improved opacities in the left lung with improved aeration of the left lung. IMPRESSION: Interval increase in the size of a layering right pleural effusion, now large. Increased overlying opacities in the right lung may represent atelectasis, aspiration, and/or pneumonia. Improved aeration of the left lung. Electronically Signed   By: Margaretha Sheffield MD   On: 03/14/2020 13:10   CT IMAGE GUIDED DRAINAGE BY PERCUTANEOUS CATHETER  Result Date: 03/15/2020 INDICATION: 69 year old male with a history of right-sided empyema EXAM: CT GUIDED DRAINAGE OF CHEST ABSCESS MEDICATIONS: The patient is currently admitted to the hospital and receiving intravenous antibiotics. The antibiotics were administered within an appropriate time frame prior to the initiation of the procedure. ANESTHESIA/SEDATION: 1.0 mg IV Versed 50 mcg IV Fentanyl Moderate Sedation Time:  21 The patient was continuously monitored during the procedure by the interventional radiology nurse under my direct supervision. COMPLICATIONS: None TECHNIQUE: Informed written consent was obtained from the patient after a thorough discussion of the procedural risks, benefits and alternatives. All questions were addressed. Maximal Sterile Barrier Technique was utilized including caps, mask, sterile gowns, sterile gloves, sterile drape, hand hygiene and skin antiseptic. A timeout was performed prior to the initiation of the procedure. PROCEDURE: The procedure, risks, benefits, and alternatives were explained to the patient/patient's family, who  provided informed consent on the patient's behalf. Specific risks that were addressed included bleeding, infection, ongoing pneumothorax, need for further procedure/surgery, chance of hemorrhage, hemoptysis, cardiopulmonary collapse, death. Questions regarding the procedure were encouraged and answered. The patient understands and consents to the procedure. Patient was positioned in the prone position on the CT table and scout image of the chest was performed for planning purposes. Patient was prepped and draped in the usual sterile fashion. The skin and subcutaneous tissues were generously infiltrated 1% lidocaine for local anesthesia. A Yueh needle was then used to enter the pleural space with aspiration of fluid. The plastic Yueh catheter was advanced into the pleural space and an 035 guidewire was advanced to the apex of the lung under fluoroscopy. Dilation of the skin tract was performed over the wire, and then modified Seldinger technique was used to place a 12 French pigtail catheter. Catheter was attached to water seal chamber and suction was applied confirming a operational chest tube. Retention suture was placed.  Sterile dressing was placed. Patient tolerated the procedure well and remained hemodynamically stable throughout. No complications were encountered and no significant blood loss was encounter FINDINGS: Empyema of the left chest. At the completion, 12 French drain is within the pleural space. IMPRESSION: Status post right pleural drain for evacuation of empyema. Signed, Dulcy Fanny. Dellia Nims, RPVI [facility] Electronically Signed   By: Corrie Mckusick D.O.   On: 03/15/2020 17:46   US THORACENTESIS ASP PLEURAL SPACE W/IMG GUIDE  Result Date: 03/14/2020 INDICATION: RIGHT pleural effusion EXAM: ULTRASOUND GUIDED DIAGNOSTIC AND THERAPEUTIC RIGHT THORACENTESIS MEDICATIONS: None. COMPLICATIONS: None immediate. PROCEDURE: An ultrasound guided  thoracentesis was thoroughly discussed with the patient and questions answered. The benefits, risks, alternatives and complications were also discussed. The patient understands and wishes to proceed with the procedure. Written consent was obtained. Ultrasound was performed to localize and mark an adequate pocket of fluid in the posterior RIGHT chest. The area was then prepped and draped in the normal sterile fashion. 1% Lidocaine was used for local anesthesia. Under ultrasound guidance a 8 French thoracentesis catheter was introduced. Thoracentesis was performed. The catheter was removed and a dressing applied. FINDINGS: A total of approximately 400 mL of thick dark old bloody RIGHT pleural fluid was removed. Samples were sent to the laboratory as requested by the clinical team. IMPRESSION: Successful ultrasound guided RIGHT thoracentesis yielding 400 mL of dark old bloody pleural fluid. Electronically Signed   By: Lavonia Dana M.D.   On: 03/14/2020 15:27     Medical Consultants:    None.  Anti-Infectives:   vanc and cefepime  Subjective:    Ryan Turner breathing is better than yesterday.  Objective:    Vitals:   03/15/20 1955 03/15/20 2326 03/16/20 0450 03/16/20 0729  BP: (!) 159/82 (!) 172/85 (!) 144/86 (!) 145/89  Pulse: 98 93 94 93  Resp: 20 18 16 20   Temp: 98.2 F (36.8 C) 98 F (36.7 C) 98.4 F (36.9 C) 98.2 F (36.8 C)  TempSrc: Oral Oral Oral Oral  SpO2: 97% 94% 92% 92%  Weight:      Height:       SpO2: 92 % O2 Flow Rate (L/min): 4 L/min   Intake/Output Summary (Last 24 hours) at 03/16/2020 1039 Last data filed at 03/16/2020 0454 Gross per 24 hour  Intake 1263.2 ml  Output 2600 ml  Net -1336.8 ml   Filed Weights   03/14/20 1225 03/15/20 0140  Weight: 111.1 kg 112 kg    Exam: General exam: In no acute distress. Respiratory system: Good air movement and clear to auscultation. Cardiovascular system: S1 & S2 heard, RRR. No JVD. Gastrointestinal system:  Abdomen is nondistended, soft and nontender.  Extremities: No pedal edema. Skin: No rashes, lesions or ulcers  Data Reviewed:    Labs: Basic Metabolic Panel: Recent Labs  Lab 03/09/20 1659 03/09/20 1659 03/14/20 1247 03/16/20 0053  NA 139  --  133* 133*  K 5.6*   < > 4.2 4.3  CL 102  --  97* 96*  CO2 26  --  27 28  GLUCOSE 135*  --  143* 124*  BUN 18  --  11 13  CREATININE 0.64*  --  0.59* 0.84  CALCIUM 9.0  --  8.5* 8.2*   < > = values in this interval not displayed.   GFR Estimated Creatinine Clearance: 105.8 mL/min (by C-G formula based on SCr of 0.84 mg/dL). Liver Function Tests: Recent Labs  Lab 03/09/20 1659 03/14/20 1247  AST 25 66*  ALT 55* 84*  ALKPHOS  --  123  BILITOT 0.5 0.7  PROT 5.7* 6.5  ALBUMIN  --  2.7*   No results for input(s): LIPASE, AMYLASE in the last 168 hours. No results for input(s): AMMONIA in the last 168 hours. Coagulation profile No results for input(s): INR, PROTIME in the last 168 hours. COVID-19 Labs  No results for input(s): DDIMER, FERRITIN, LDH, CRP in the last 72 hours.  Lab Results  Component  Value Date   SARSCOV2NAA NEGATIVE 03/14/2020   SARSCOV2NAA DETECTED (A) 02/14/2020    CBC: Recent Labs  Lab 03/09/20 1659 03/14/20 1247 03/15/20 1035 03/16/20 0053  WBC 10.8 12.7* 9.0 9.1  NEUTROABS 9,623* 11.4* 7.7 7.0  HGB 15.5 13.9 12.6* 12.7*  HCT 45.0 42.0 38.1* 38.4*  MCV 93.0 96.3 97.2 96.2  PLT 174 225 218 244   Cardiac Enzymes: No results for input(s): CKTOTAL, CKMB, CKMBINDEX, TROPONINI in the last 168 hours. BNP (last 3 results) No results for input(s): PROBNP in the last 8760 hours. CBG: No results for input(s): GLUCAP in the last 168 hours. D-Dimer: No results for input(s): DDIMER in the last 72 hours. Hgb A1c: No results for input(s): HGBA1C in the last 72 hours. Lipid Profile: No results for input(s): CHOL, HDL, LDLCALC, TRIG, CHOLHDL, LDLDIRECT in the last 72 hours. Thyroid function studies: No  results for input(s): TSH, T4TOTAL, T3FREE, THYROIDAB in the last 72 hours.  Invalid input(s): FREET3 Anemia work up: No results for input(s): VITAMINB12, FOLATE, FERRITIN, TIBC, IRON, RETICCTPCT in the last 72 hours. Sepsis Labs: Recent Labs  Lab 03/09/20 1659 03/14/20 1247 03/15/20 1035 03/16/20 0053  WBC 10.8 12.7* 9.0 9.1   Microbiology Recent Results (from the past 240 hour(s))  SARS Coronavirus 2 by RT PCR (hospital order, performed in [facility] hospital lab) Nasopharyngeal Nasopharyngeal Swab     Status: None   Collection Time: 03/14/20 12:56 PM   Specimen: Nasopharyngeal Swab  Result Value Ref Range Status   SARS Coronavirus 2 NEGATIVE NEGATIVE Final    Comment: (NOTE) SARS-CoV-2 target nucleic acids are NOT DETECTED.  The SARS-CoV-2 RNA is generally detectable in upper and lower respiratory specimens during the acute phase of infection. The lowest concentration of SARS-CoV-2 viral copies this assay can detect is 250 copies / mL. A negative result does not preclude SARS-CoV-2 infection and should not be used as the sole basis for treatment or other patient management decisions.  A negative result may occur with improper specimen collection / handling, submission of specimen other than nasopharyngeal swab, presence of viral mutation(s) within the areas targeted by this assay, and inadequate number of viral copies (<250 copies / mL). A negative result must be combined with clinical observations, patient history, and epidemiological information.  Fact Sheet for Patients:   StrictlyIdeas.no  Fact Sheet for Healthcare Providers: BankingDealers.co.za  This test is not yet approved or  cleared by the [place] FDA and has been authorized for detection and/or diagnosis of SARS-CoV-2 by FDA under an Emergency Use Authorization (EUA).  This EUA will remain in effect (meaning this test can be used) for the duration of  the COVID-19 declaration under Section 564(b)(1) of the Act, 21 U.S.C. section 360bbb-3(b)(1), unless the authorization is terminated or revoked sooner.  Performed at [facility], [address], [place], [place] [address]   Gram stain     Status: None   Collection Time: 03/14/20  2:17 PM   Specimen: Pleura; Body Fluid  Result Value Ref Range Status   Specimen Description PLEURAL  Final   Special Requests NONE  Final   Gram Stain   Final    GRAM POSITIVE COCCI Gram Stain Report Called to,Read Back By and Verified With: CARDWELL,L ON 03/14/20 AT 1640 BY LOY,C WBC PRESENT,BOTH PMN AND MONONUCLEAR CYTOSPIN SMEAR PERFORMED AT [facility] Performed at [facility], [address] [place], [place] [address]    Report Status 03/14/2020 FINAL  Final  Culture, body fluid-bottle  Status: Abnormal (Preliminary result)   Collection Time: 03/14/20  2:48 PM   Specimen: Pleura  Result Value Ref Range Status   Specimen Description   Final    PLEURAL Performed at [facility], [address], [place], [place] [address]    Special Requests   Final    10CC Performed at [facility], [address] [place], [place] [address]    Gram Stain   Final    GRAM POSITIVE COCCI IN BOTH AEROBIC AND ANAEROBIC BOTTLES Gram Stain Report Called to,Read Back By and Verified With: M DOSS,RN @0031  03/15/20 [facility] Performed at [facility], [address], [place], [place] [address]    Culture (A)  Final    STAPHYLOCOCCUS AUREUS SUSCEPTIBILITIES TO FOLLOW Performed at [facility] [address] [place], [place] [address]    Report Status PENDING  Incomplete  MRSA PCR Screening     Status: None   Collection Time: 03/15/20 10:59 AM   Specimen: Nasopharyngeal  Result Value Ref Range Status   MRSA by PCR NEGATIVE NEGATIVE Final    Comment:        The GeneXpert MRSA Assay (FDA approved for NASAL specimens only), is one component of a comprehensive MRSA colonization surveillance program. It is  not intended to diagnose MRSA infection nor to guide or monitor treatment for MRSA infections. Performed at [facility], [place] [address], [place], [place] [address]   Aerobic/Anaerobic Culture (surgical/deep wound)     Status: None (Preliminary result)   Collection Time: 03/15/20  4:15 PM   Specimen: Pleural Fluid  Result Value Ref Range Status   Specimen Description FLUID RIGHT PLEURAL  Final   Special Requests NONE  Final   Gram Stain   Final    RARE WBC PRESENT, PREDOMINANTLY PMN RARE GRAM POSITIVE COCCI Performed at [facility], [place] [address] [place],  [address]    Culture PENDING  Incomplete   Report Status PENDING  Incomplete     Medications:   . melatonin  3 mg Oral QHS  . predniSONE  20 mg Oral Q breakfast   Continuous Infusions: . sodium chloride 250 mL (03/15/20 0235)  . ceFEPime (MAXIPIME) IV 2 g (03/16/20 0948)  . heparin 1,800 Units/hr (03/16/20 0947)  . vancomycin Stopped (03/15/20 2158)      LOS: 2 days   Charlynne Cousins  [facility]  03/16/2020, 10:39 AM

## 2020-03-16 NOTE — Progress Notes (Signed)
1425: Alteplase 10mg  (71mLs) and Dornase 5mg  (30Ls) administered into R. pleural space via pigtail chest tube by Heber Parkline NP.  1525: Stopcock opened per instruction and 330cc of serosanguineous fluid immediately drained out. RN noticed patient was slightly flushed and respirations 25. Patient observed to be uncomfortable and hot., Temp 99.4. Patient reported lower back pain and R. Shoulder pain. RN administered 650mg  tylenol @1540 .    1630: Patients temp down to 98.7, but increasingly more flushed, uncomfortable and R. Shoulder pain more significant. Garry Heater NP for advice, STAT chest x ray ordered and CCM Provider to be notified.   1645: Dr. Vaughan Browner at bedside, RN administered 0.5 mg Dilaudid and called [facility] RN for advice. VSS and Sp02 improved after CT tube opened despite patient increasingly flushed, shoulder pain and increase in RR and "not feeling right" per patient. Additional 150cc noted to have drained from CT.  15:30 Chest X-Ray showed improvement, symptoms possibly due to lung expansion after fluid drained per MD.   Patient resting slightly after Dilaudid administration. Will continue to monitor patient for increase in symptoms and CT status.   Loleta Dicker RN

## 2020-03-16 NOTE — Progress Notes (Addendum)
   NAME:  Ryan Turner, MRN:  [id number], DOB:  1950/08/23, LOS: 2 ADMISSION DATE:  03/14/2020, CONSULTATION DATE:  03/14/20  REFERRING MD:  EDP, CHIEF COMPLAINT:  Sob/L hemothorax   Brief History   69 yowm active cigar smoker from [place] s/p covid 19 pna and steady improvement in activity tol p d/c on 2lpm rest/ 4lpm with activity and steroid taper then 3 d PTA with new sob/ R pleuritic cp and orthopnea > ED am 8/24 [facility] with ? Tension hydrothorax > emergent T centesis by IR yielded only 400 cc blood and pccm asked to evaluate with ddx = staph empyema.  Past Medical History   HBP  Significant Hospital Events   8/24 admit, thoracentesis 8/25 Pigtail by IR  Consults:  PCCM 8/24   Procedures:  R Thoracentesis 8/24  = 400 cc blood with glucose < 20 and LDH 2,777 and wbc > 10k with mostly polys  Significant Diagnostic Tests:  Venous dopplers 02/22/20 Findings consistent with acute deep vein thrombosis involving the left posterior tibial veins, and left peroneal veins  Venous doppler L side 8/24 >>> negative  Micro Data:  Covid 19  8/24  Neg  Pleural fluid 8/24 >>> MRSA   Antimicrobials:  maxepime 8/24 >>> vanc  8/24 >>>  Interim history/subjective:   Underwent IR placement of pigtail on the right.  Objective   Blood pressure (!) 145/89, pulse 93, temperature 98.2 F (36.8 C), temperature source Oral, resp. rate 20, height 6\' 1"  (1.854 m), weight 112 kg, SpO2 92 %.        Intake/Output Summary (Last 24 hours) at 03/16/2020 1152 Last data filed at 03/16/2020 0454 Gross per 24 hour  Intake 1263.2 ml  Output 2050 ml  Net -786.8 ml   Filed Weights   03/14/20 1225 03/15/20 0140  Weight: 111.1 kg 112 kg    Examination: Gen:      No acute distress HEENT:  EOMI, sclera anicteric Neck:     No masses; no thyromegaly Lungs:    Clear to auscultation bilaterally; normal respiratory effort CV:         Regular rate and rhythm; no murmurs Abd:      + bowel sounds; soft,  non-tender; no palpable masses, no distension Ext:    No edema; adequate peripheral perfusion Skin:      Warm and dry; no rash Neuro: alert and oriented x 3 Psych: normal mood and affect   Resolved Hospital Problem list     Assessment & Plan:  Loculated right effusion, empyema Evaluation by cardiothoracic surgery and he is not a candidate for VATS CXR today reviewed with right effusion which is smaller but still appears loculated.   He has put out additional 300cc serosanguineous since yesterday Start thrombolytic therapy. He is at increased risk for bleeding given as he is on heparin for DVT. Risk/benefit discussed with patient and he has agreed to proceed Continue antibiotics  Best practice:  Per primary team  Signature:    Marshell Garfinkel MD [facility] Please see [email or web address] for pager details.  03/16/2020, 11:59 AM

## 2020-03-16 NOTE — Progress Notes (Signed)
Referring Physician(s): Gilford Raid  Supervising Physician: Corrie Mckusick  Patient Status:  [facility] - In-pt  Chief Complaint:  F/U Chest tube placement  Brief History:  Ryan Turner is a 69 y.o. male with history of COVID-19 infection 02/14/2020.  He was hospitalized 02/20/2020 to 03/03/2020 for management of COVID-19 pneumonia.  He was discharged home 03/03/2020 with outpatient pulmonology follow-up.   He presented to [facility] ED 03/14/2020 after being sent by his pulmonologist for right thoracentesis (CXR revealing right pleural effusion).   He underwent a right thoracentesis in IR 03/14/2020 by Dr. Thornton Papas yielding 400 mL dark bloody fluid.   He was transferred and admitted to [facility] for further management.   CTA chest revealed large right pleural effusion despite recent thoracentesis.   TCTS was consulted who recommended IR consult for possible right chest tube prior to lytic therapy administration.  He had placement of a 12 fr pigtail placed by Dr. Earleen Newport yesterday.  Subjective:  Looks better today. Sitting up in bed. Complimented the IR team and Dr. Earleen Newport and said "Y'all did a great job yesterday"  Allergies: Morphine  Medications: Prior to Admission medications   Medication Sig Start Date End Date Taking? Authorizing Provider  acetaminophen (TYLENOL) 325 MG tablet Take 2 tablets (650 mg total) by mouth every 6 (six) hours as needed for mild pain or headache (fever >/= 101). 03/03/20  Yes Cherene Altes, MD  apixaban (ELIQUIS) 5 MG TABS tablet Take 1 tablet (5 mg total) by mouth 2 (two) times daily. 04/08/20  Yes Cherene Altes, MD  celecoxib (CELEBREX) 200 MG capsule TAKE 1 CAPSULE (200 MG TOTAL) BY MOUTH AS NEEDED. Patient taking differently: Take 200 mg by mouth daily as needed for mild pain.  01/04/19  Yes [name], MD  docusate sodium (COLACE) 100 MG capsule Take 200 mg by mouth daily as needed for mild constipation or moderate constipation.    Yes  [provider]  Glucosamine-Chondroit-Vit C-Mn (GLUCOSAMINE CHONDROITIN COMPLX) CAPS Take 2 capsules by mouth daily. 1800 mg   Yes [provider]  Multiple Vitamins-Minerals (OCUVITE PRESERVISION PO) Take 2 tablets by mouth daily.    Yes [provider]  Omega-3 Fatty Acids (FISH OIL) 1200 MG CAPS Take 2 capsules by mouth daily.   Yes [provider]  predniSONE (DELTASONE) 20 MG tablet Take 1 tablet (20 mg total) by mouth daily with breakfast for 7 days. 03/15/20 03/22/20 Yes Cherene Altes, MD  traMADol (ULTRAM) 50 MG tablet Take 1 tablet (50 mg total) by mouth every 8 (eight) hours as needed for up to 10 days. 03/13/20 03/23/20 Yes [name], MD  zolpidem (AMBIEN) 10 MG tablet Take 0.5-1 tablets (5-10 mg total) by mouth at bedtime as needed. for sleep 11/04/19  Yes [name], MD  clotrimazole-betamethasone (LOTRISONE) cream Apply 1 application topically 2 (two) times daily. Patient not taking: Reported on 03/14/2020 03/09/20   [name], MD  fluticasone [facility]) 50 MCG/ACT nasal spray Place into both nostrils as needed for allergies or rhinitis. Patient not taking: Reported on 03/09/2020    [provider]  predniSONE (DELTASONE) 10 MG tablet Take 1 tablet (10 mg total) by mouth daily with breakfast for 7 days. Patient not taking: Reported on 03/09/2020 03/22/20 03/29/20  Cherene Altes, MD  sildenafil (VIAGRA) 100 MG tablet Take 0.5-1 tablets (50-100 mg total) by mouth daily as needed for erectile dysfunction. Patient not taking: Reported on 03/09/2020 07/14/19   Pickard,  [name], MD     Vital Signs: BP (!) 145/89 (BP Location: Right Arm)   Pulse 93   Temp 98.2 F (36.8 C) (Oral)   Resp 20   Ht 6\' 1"  (1.854 m)   Wt 112 kg   SpO2 92%   BMI 32.58 kg/m   Physical Exam Constitutional:      Appearance: Normal appearance.  HENT:     Head: Normocephalic and atraumatic.  Cardiovascular:     Rate and Rhythm: Normal rate.    Pulmonary:     Effort: Pulmonary effort is normal. No respiratory distress.     Comments: Right side chest tube in place. ~850 mL bloody output Skin:    General: Skin is warm and dry.  Neurological:     General: No focal deficit present.     Mental Status: He is alert and oriented to person, place, and time.  Psychiatric:        Mood and Affect: Mood normal.        Behavior: Behavior normal.        Thought Content: Thought content normal.        Judgment: Judgment normal.     Imaging: DG Chest 1 View  Result Date: 03/14/2020 CLINICAL DATA:  RIGHT pneumothorax post thoracentesis EXAM: CHEST  1 VIEW COMPARISON:  Extra tori AP chest radiograph 1451 hours compared to earlier study of 1254 hours FINDINGS: Enlargement of cardiac silhouette. BILATERAL pulmonary infiltrates consistent with history of recent COVID pneumonia. Again identified moderate to large RIGHT pleural effusion, accentuated by expiratory technique. Significant RIGHT basilar atelectasis. No pneumothorax following thoracentesis. IMPRESSION: No pneumothorax following RIGHT thoracentesis. Persistent RIGHT pleural effusion/hemothorax, RIGHT basilar atelectasis, and BILATERAL infiltrates. Electronically Signed   By: Lavonia Dana M.D.   On: 03/14/2020 15:28   CT Angio Chest PE W and/or Wo Contrast  Result Date: 03/14/2020 CLINICAL DATA:  Shortness of breath, COVID-19 positivity, recent right thoracentesis EXAM: CT ANGIOGRAPHY CHEST WITH CONTRAST TECHNIQUE: Multidetector CT imaging of the chest was performed using the standard protocol during bolus administration of intravenous contrast. Multiplanar CT image reconstructions and MIPs were obtained to evaluate the vascular anatomy. CONTRAST:  133mL OMNIPAQUE IOHEXOL 350 MG/ML SOLN COMPARISON:  Chest x-ray from earlier in the same day. FINDINGS: Cardiovascular: Thoracic aorta demonstrates mild atherosclerotic calcification without aneurysmal dilatation or dissection. No significant cardiac  enlargement is seen. Scattered coronary calcifications are noted. The pulmonary artery shows a normal branching pattern. Motion artifact limits evaluation of the lower lobe branches. No definitive intraluminal filling defect to suggest pulmonary embolism is noted. Mediastinum/Nodes: Thoracic inlet is within normal limits. Scattered small hilar and mediastinal lymph nodes are noted likely reactive in nature. The esophagus as visualized is within normal limits. Lungs/Pleura: Patchy peripheral infiltrates are identified within both lungs consistent with the given clinical history of COVID-19 positivity. Additionally there is consolidation in the right lower lobe likely of a more compressive affect given the large right-sided pleural effusion. This persists despite recent thoracentesis. No pneumothorax is noted. Upper Abdomen: Visualized upper abdomen shows no acute abnormality. Musculoskeletal: No chest wall abnormality. No acute or significant osseous findings. Review of the MIP images confirms the above findings. IMPRESSION: Patchy bilateral airspace opacities consistent with the given clinical history of COVID-19 positivity. Large right-sided pleural effusion despite recent thoracentesis. This causes some compressive consolidation particularly in the right lower lobe. No pneumothorax is noted. No evidence of pulmonary emboli. Aortic Atherosclerosis (ICD10-I70.0). Electronically Signed   By: Linus Mako.D.  On: 03/14/2020 18:57   US Venous Img Lower Unilateral Left (DVT)  Result Date: 03/14/2020 CLINICAL DATA:  Shortness of breath EXAM: Left LOWER EXTREMITY VENOUS DOPPLER ULTRASOUND TECHNIQUE: Gray-scale sonography with compression, as well as color and duplex ultrasound, were performed to evaluate the deep venous system(s) from the level of the common femoral vein through the popliteal and proximal calf veins. COMPARISON:  None. FINDINGS: VENOUS Normal compressibility of the common femoral, superficial  femoral, and popliteal veins, as well as the visualized calf veins. Visualized portions of profunda femoral vein and great saphenous vein unremarkable. No filling defects to suggest DVT on grayscale or color Doppler imaging. Doppler waveforms show normal direction of venous flow, normal respiratory plasticity and response to augmentation. Limited views of the contralateral common femoral vein are unremarkable. OTHER None. Limitations: none IMPRESSION: Negative. Electronically Signed   By: Donavan Foil M.D.   On: 03/14/2020 17:53   DG CHEST PORT 1 VIEW  Result Date: 03/16/2020 CLINICAL DATA:  Shortness of breath, cough. EXAM: PORTABLE CHEST 1 VIEW COMPARISON:  March 14, 2020. FINDINGS: Stable cardiomegaly. Stable interstitial densities are noted throughout both lungs concerning for pulmonary edema or possibly pneumonia. Interval placement of pigtail drainage catheter into right lung base. Loculated right pleural effusion is slightly smaller in size. No pneumothorax is noted. Bony thorax is unremarkable. IMPRESSION: Interval placement of pigtail drainage catheter into right lung base. Loculated right pleural effusion is slightly smaller in size. Stable interstitial densities throughout both lungs concerning for pulmonary edema or possibly pneumonia. Electronically Signed   By: Marijo Conception M.D.   On: 03/16/2020 08:10   DG Chest Port 1 View  Result Date: 03/14/2020 CLINICAL DATA:  Increasing shortness of breath.  COVID positive. EXAM: PORTABLE CHEST 1 VIEW COMPARISON:  03/10/2020 FINDINGS: Interval increase in the size of a layering right pleural effusion, now large. No substantial left pleural effusion. Increased overlying opacities in the right lung. Cardiopericardial silhouette is similar to prior, but partially obscured. No discernible pneumothorax. Improved opacities in the left lung with improved aeration of the left lung. IMPRESSION: Interval increase in the size of a layering right pleural  effusion, now large. Increased overlying opacities in the right lung may represent atelectasis, aspiration, and/or pneumonia. Improved aeration of the left lung. Electronically Signed   By: Margaretha Sheffield MD   On: 03/14/2020 13:10   CT IMAGE GUIDED DRAINAGE BY PERCUTANEOUS CATHETER  Result Date: 03/15/2020 INDICATION: 69 year old male with a history of right-sided empyema EXAM: CT GUIDED DRAINAGE OF CHEST ABSCESS MEDICATIONS: The patient is currently admitted to the hospital and receiving intravenous antibiotics. The antibiotics were administered within an appropriate time frame prior to the initiation of the procedure. ANESTHESIA/SEDATION: 1.0 mg IV Versed 50 mcg IV Fentanyl Moderate Sedation Time:  21 The patient was continuously monitored during the procedure by the interventional radiology nurse under my direct supervision. COMPLICATIONS: None TECHNIQUE: Informed written consent was obtained from the patient after a thorough discussion of the procedural risks, benefits and alternatives. All questions were addressed. Maximal Sterile Barrier Technique was utilized including caps, mask, sterile gowns, sterile gloves, sterile drape, hand hygiene and skin antiseptic. A timeout was performed prior to the initiation of the procedure. PROCEDURE: The procedure, risks, benefits, and alternatives were explained to the patient/patient's family, who provided informed consent on the patient's behalf. Specific risks that were addressed included bleeding, infection, ongoing pneumothorax, need for further procedure/surgery, chance of hemorrhage, hemoptysis, cardiopulmonary collapse, death. Questions regarding the procedure were encouraged and  answered. The patient understands and consents to the procedure. Patient was positioned in the prone position on the CT table and scout image of the chest was performed for planning purposes. Patient was prepped and draped in the usual sterile fashion. The skin and subcutaneous  tissues were generously infiltrated 1% lidocaine for local anesthesia. A Yueh needle was then used to enter the pleural space with aspiration of fluid. The plastic Yueh catheter was advanced into the pleural space and an 035 guidewire was advanced to the apex of the lung under fluoroscopy. Dilation of the skin tract was performed over the wire, and then modified Seldinger technique was used to place a 12 French pigtail catheter. Catheter was attached to water seal chamber and suction was applied confirming a operational chest tube. Retention suture was placed.  Sterile dressing was placed. Patient tolerated the procedure well and remained hemodynamically stable throughout. No complications were encountered and no significant blood loss was encounter FINDINGS: Empyema of the left chest. At the completion, 12 French drain is within the pleural space. IMPRESSION: Status post right pleural drain for evacuation of empyema. Signed, Dulcy Fanny. Dellia Nims, RPVI [facility] Electronically Signed   By: Corrie Mckusick D.O.   On: 03/15/2020 17:46   US THORACENTESIS ASP PLEURAL SPACE W/IMG GUIDE  Result Date: 03/14/2020 INDICATION: RIGHT pleural effusion EXAM: ULTRASOUND GUIDED DIAGNOSTIC AND THERAPEUTIC RIGHT THORACENTESIS MEDICATIONS: None. COMPLICATIONS: None immediate. PROCEDURE: An ultrasound guided thoracentesis was thoroughly discussed with the patient and questions answered. The benefits, risks, alternatives and complications were also discussed. The patient understands and wishes to proceed with the procedure. Written consent was obtained. Ultrasound was performed to localize and mark an adequate pocket of fluid in the posterior RIGHT chest. The area was then prepped and draped in the normal sterile fashion. 1% Lidocaine was used for local anesthesia. Under ultrasound guidance a 8 French thoracentesis catheter was introduced. Thoracentesis was performed. The  catheter was removed and a dressing applied. FINDINGS: A total of approximately 400 mL of thick dark old bloody RIGHT pleural fluid was removed. Samples were sent to the laboratory as requested by the clinical team. IMPRESSION: Successful ultrasound guided RIGHT thoracentesis yielding 400 mL of dark old bloody pleural fluid. Electronically Signed   By: Lavonia Dana M.D.   On: 03/14/2020 15:27    Labs:  CBC: Recent Labs    03/09/20 1659 03/14/20 1247 03/15/20 1035 03/16/20 0053  WBC 10.8 12.7* 9.0 9.1  HGB 15.5 13.9 12.6* 12.7*  HCT 45.0 42.0 38.1* 38.4*  PLT 174 225 218 244    COAGS: Recent Labs    03/15/20 0003 03/15/20 1035 03/16/20 0053  APTT 42* 62* 70*    BMP: Recent Labs    03/03/20 0541 03/09/20 1659 03/14/20 1247 03/16/20 0053  NA 136 139 133* 133*  K 4.3 5.6* 4.2 4.3  CL 97* 102 97* 96*  CO2 30 26 27 28   GLUCOSE 93 135* 143* 124*  BUN 23 18 11 13   CALCIUM 8.2* 9.0 8.5* 8.2*  CREATININE 1.08 0.64* 0.59* 0.84  GFRNONAA >60 99 >60 >60  GFRAA >60 114 >60 >60    LIVER FUNCTION TESTS: Recent Labs    03/01/20 0147 03/01/20 0147 03/02/20 1135 03/03/20 0541 03/09/20 1659 03/14/20 1247  BILITOT 0.7   < > 0.8 0.8 0.5 0.7  AST 29   < > 20 24 25  66*  ALT 60*   < > 42 43 55* 84*  ALKPHOS 105  --  77 70  --  123  PROT 4.8*   < > 4.6* 4.6* 5.7* 6.5  ALBUMIN 2.4*  --  2.3* 2.2*  --  2.7*   < > = values in this interval not displayed.    Assessment and Plan:  Large right pleural effusion despite recent thoracentesis.  S/P placement of a 12 fr pigtail placed by Dr. Earleen Newport yesterday.  Doing well. Continue routine chest tube care.  CT surgery following.  Electronically Signed: Murrell Redden, PA-C 03/16/2020, 2:13 PM    I spent a total of 15 Minutes at the the patient's bedside AND on the patient's hospital floor or unit, greater than 50% of which was counseling/coordinating care for f/u pigtail chest tube.

## 2020-03-16 NOTE — Telephone Encounter (Signed)
Will need to call back as it is after 5pm.

## 2020-03-17 ENCOUNTER — Institutional Professional Consult (permissible substitution): Payer: BC Managed Care – PPO | Admitting: Emergency Medicine

## 2020-03-17 ENCOUNTER — Inpatient Hospital Stay (HOSPITAL_COMMUNITY): Payer: BC Managed Care – PPO

## 2020-03-17 DIAGNOSIS — J90 Pleural effusion, not elsewhere classified: Secondary | ICD-10-CM

## 2020-03-17 DIAGNOSIS — Z9889 Other specified postprocedural states: Secondary | ICD-10-CM

## 2020-03-17 LAB — CBC WITH DIFFERENTIAL/PLATELET
Abs Immature Granulocytes: 0.14 10*3/uL — ABNORMAL HIGH (ref 0.00–0.07)
Basophils Absolute: 0 10*3/uL (ref 0.0–0.1)
Basophils Relative: 0 %
Eosinophils Absolute: 0.1 10*3/uL (ref 0.0–0.5)
Eosinophils Relative: 2 %
HCT: 39.5 % (ref 39.0–52.0)
Hemoglobin: 12.8 g/dL — ABNORMAL LOW (ref 13.0–17.0)
Immature Granulocytes: 2 %
Lymphocytes Relative: 16 %
Lymphs Abs: 1.2 10*3/uL (ref 0.7–4.0)
MCH: 31.1 pg (ref 26.0–34.0)
MCHC: 32.4 g/dL (ref 30.0–36.0)
MCV: 96.1 fL (ref 80.0–100.0)
Monocytes Absolute: 0.6 10*3/uL (ref 0.1–1.0)
Monocytes Relative: 9 %
Neutro Abs: 5.2 10*3/uL (ref 1.7–7.7)
Neutrophils Relative %: 71 %
Platelets: 247 10*3/uL (ref 150–400)
RBC: 4.11 MIL/uL — ABNORMAL LOW (ref 4.22–5.81)
RDW: 14.3 % (ref 11.5–15.5)
WBC: 7.3 10*3/uL (ref 4.0–10.5)
nRBC: 0 % (ref 0.0–0.2)

## 2020-03-17 LAB — BASIC METABOLIC PANEL
Anion gap: 7 (ref 5–15)
BUN: 7 mg/dL — ABNORMAL LOW (ref 8–23)
CO2: 29 mmol/L (ref 22–32)
Calcium: 8.4 mg/dL — ABNORMAL LOW (ref 8.9–10.3)
Chloride: 98 mmol/L (ref 98–111)
Creatinine, Ser: 0.68 mg/dL (ref 0.61–1.24)
GFR calc Af Amer: >60 mL/min (ref >60)
GFR calc non Af Amer: >60 mL/min (ref >60)
Glucose, Bld: 112 mg/dL — ABNORMAL HIGH (ref 70–99)
Potassium: 4.8 mmol/L (ref 3.5–5.1)
Sodium: 134 mmol/L — ABNORMAL LOW (ref 135–145)

## 2020-03-17 LAB — CULTURE, BODY FLUID W GRAM STAIN -BOTTLE

## 2020-03-17 LAB — APTT: aPTT: 71 seconds — ABNORMAL HIGH (ref 24–36)

## 2020-03-17 LAB — HEPARIN LEVEL (UNFRACTIONATED): Heparin Unfractionated: 0.43 IU/mL (ref 0.30–0.70)

## 2020-03-17 MED ORDER — PREDNISONE 5 MG PO TABS
5.0000 mg | ORAL_TABLET | Freq: Every day | ORAL | Status: DC
  Administered 2020-03-18 – 2020-03-19 (×2): 5 mg via ORAL
  Filled 2020-03-17 (×2): qty 1

## 2020-03-17 MED ORDER — STERILE WATER FOR INJECTION IJ SOLN
5.0000 mg | Freq: Two times a day (BID) | RESPIRATORY_TRACT | Status: DC
  Administered 2020-03-17: 5 mg via INTRAPLEURAL
  Filled 2020-03-17 (×7): qty 5

## 2020-03-17 MED ORDER — SODIUM CHLORIDE (PF) 0.9 % IJ SOLN
10.0000 mg | Freq: Two times a day (BID) | INTRAMUSCULAR | Status: DC
  Administered 2020-03-17: 10 mg via INTRAPLEURAL
  Filled 2020-03-17 (×5): qty 10

## 2020-03-17 NOTE — Progress Notes (Signed)
Pharmacy called the nurse to ask when someone who was going to give the ordered alteplase and dornase alpha meds, so that they would know when to prepare it.  The nurse called the on call Critical Care MD to see when a critical care representative was going to come by.  The on call stated he would look into it.  Will continue to monitor.  Lupita Dawn, RN

## 2020-03-17 NOTE — Progress Notes (Signed)
[facility] PROGRESS NOTE    Progress Note  Ryan Turner  EAV:[id number] DOB: 1950/11/29 DOA: 03/14/2020 PCP: [name], MD     Brief Narrative:   Ryan Turner is an 69 y.o. male past medical history significant for recent COVID-19 pneumonia, essential hypertension with a prolonged hospitalization recent discharge on 03/03/2020 for Covid pneumonia complicated by DVT which he completed treatment and was discharged home on steroid taper and Eliquis and 3 L of oxygen.  Main to the ED with increased difficulty breathing and with sats dropping into the 68%, and was diagnosed with empyema in the right pleural space.  Assessment/Plan:   Loculated empyema of right pleural space (HCC) CT surgery was consulted to pick tail catheter was inserted on 11/14/2019 by IR, and he has put out about 1800 through his catheter. Pulmonary critical care was consulted and they are instilling thrombolytics. Cultures grew MRSA he is currently on IV vancomycin, will discontinue cefepime for now. Continue IV narcotics for pain. Appreciate pulmonary and CT surgery's assistance.  Left lower extremity DVT: IV heparin resume, for discharge we will put him back on his Eliquis.  Elevated blood pressure without a diagnosis of hypertension: She is currently on no antihypertensive medications at home.  History of a recent pneumonia due to COVID-19 virus Discharged on 3 to 6 L of oxygen, continue steroid taper.  DVT (deep venous thrombosis) (HCC) On IV heparin for now.   DVT prophylaxis: heparin Family Communication:none Status is: Inpatient  Remains inpatient appropriate because:Hemodynamically unstable   Dispo: The patient is from: Home              Anticipated d/c is to: Home              Anticipated d/c date is: 2 days              Patient currently is not medically stable to d/c.        Code Status:     Code Status Orders  (From admission, onward)         Start      Ordered   03/14/20 2218  Do not attempt resuscitation (DNR)  Continuous       Question Answer Comment  In the event of cardiac or respiratory ARREST Do not call a "code blue"   In the event of cardiac or respiratory ARREST Do not perform Intubation, CPR, defibrillation or ACLS   In the event of cardiac or respiratory ARREST Use medication by any route, position, wound care, and other measures to relive pain and suffering. May use oxygen, suction and manual treatment of airway obstruction as needed for comfort.      03/14/20 2217        Code Status History    Date Active Date Inactive Code Status Order ID Comments User Context   02/26/2020 1529 03/04/2020 0009 DNR [id number]  Jonetta Osgood, MD Inpatient   02/20/2020 2230 02/26/2020 1529 Full Code [id number]  Rolla Plate, DO ED   Advance Care Planning Activity    Advance Directive Documentation     Most Recent Value  Type of Advance Directive Living will  Pre-existing out of facility DNR order (yellow form or pink MOST form) --  "MOST" Form in Place? --        IV Access:    Peripheral IV   Procedures and diagnostic studies:   DG CHEST PORT 1 VIEW  Result Date: 03/17/2020 CLINICAL DATA:  Loculated pleural effusion EXAM: PORTABLE  CHEST 1 VIEW COMPARISON:  03/16/2020 FINDINGS: Moderate loculated right pleural effusion with indwelling pigtail drain. Course patchy opacities in the lungs bilaterally, compatible with multifocal pneumonia. No pneumothorax. The heart is top-normal in size. IMPRESSION: Moderate loculated right pleural effusion with indwelling pigtail drain. Multifocal pneumonia, grossly unchanged. Electronically Signed   By: Julian Hy M.D.   On: 03/17/2020 07:53   DG Chest Port 1 View  Result Date: 03/16/2020 CLINICAL DATA:  Shortness of breath EXAM: PORTABLE CHEST 1 VIEW COMPARISON:  03/16/2020 FINDINGS: Right basilar chest tube in place, unchanged. Small right pleural effusion. Extensive bilateral airspace  disease, unchanged. Mild cardiomegaly. No pneumothorax or acute bony abnormality. IMPRESSION: Diffuse bilateral airspace disease, unchanged. Right basilar chest tube with small right pleural effusion. No pneumothorax. Electronically Signed   By: Rolm Baptise M.D.   On: 03/16/2020 17:32   DG CHEST PORT 1 VIEW  Result Date: 03/16/2020 CLINICAL DATA:  Shortness of breath, cough. EXAM: PORTABLE CHEST 1 VIEW COMPARISON:  March 14, 2020. FINDINGS: Stable cardiomegaly. Stable interstitial densities are noted throughout both lungs concerning for pulmonary edema or possibly pneumonia. Interval placement of pigtail drainage catheter into right lung base. Loculated right pleural effusion is slightly smaller in size. No pneumothorax is noted. Bony thorax is unremarkable. IMPRESSION: Interval placement of pigtail drainage catheter into right lung base. Loculated right pleural effusion is slightly smaller in size. Stable interstitial densities throughout both lungs concerning for pulmonary edema or possibly pneumonia. Electronically Signed   By: Marijo Conception M.D.   On: 03/16/2020 08:10   CT IMAGE GUIDED DRAINAGE BY PERCUTANEOUS CATHETER  Result Date: 03/15/2020 INDICATION: 69 year old male with a history of right-sided empyema EXAM: CT GUIDED DRAINAGE OF CHEST ABSCESS MEDICATIONS: The patient is currently admitted to the hospital and receiving intravenous antibiotics. The antibiotics were administered within an appropriate time frame prior to the initiation of the procedure. ANESTHESIA/SEDATION: 1.0 mg IV Versed 50 mcg IV Fentanyl Moderate Sedation Time:  21 The patient was continuously monitored during the procedure by the interventional radiology nurse under my direct supervision. COMPLICATIONS: None TECHNIQUE: Informed written consent was obtained from the patient after a thorough discussion of the procedural risks, benefits and alternatives. All questions were addressed. Maximal Sterile Barrier Technique was  utilized including caps, mask, sterile gowns, sterile gloves, sterile drape, hand hygiene and skin antiseptic. A timeout was performed prior to the initiation of the procedure. PROCEDURE: The procedure, risks, benefits, and alternatives were explained to the patient/patient's family, who provided informed consent on the patient's behalf. Specific risks that were addressed included bleeding, infection, ongoing pneumothorax, need for further procedure/surgery, chance of hemorrhage, hemoptysis, cardiopulmonary collapse, death. Questions regarding the procedure were encouraged and answered. The patient understands and consents to the procedure. Patient was positioned in the prone position on the CT table and scout image of the chest was performed for planning purposes. Patient was prepped and draped in the usual sterile fashion. The skin and subcutaneous tissues were generously infiltrated 1% lidocaine for local anesthesia. A Yueh needle was then used to enter the pleural space with aspiration of fluid. The plastic Yueh catheter was advanced into the pleural space and an 035 guidewire was advanced to the apex of the lung under fluoroscopy. Dilation of the skin tract was performed over the wire, and then modified Seldinger technique was used to place a 12 French pigtail catheter. Catheter was attached to water seal chamber and suction was applied confirming a operational chest tube. Retention suture was placed.  Sterile dressing was placed. Patient tolerated the procedure well and remained hemodynamically stable throughout. No complications were encountered and no significant blood loss was encounter FINDINGS: Empyema of the left chest. At the completion, 12 French drain is within the pleural space. IMPRESSION: Status post right pleural drain for evacuation of empyema. Signed, Dulcy Fanny. Dellia Nims, RPVI [facility] Radiology Electronically Signed   By: Corrie Mckusick D.O.    On: 03/15/2020 17:46     Medical Consultants:    None.  Anti-Infectives:   vanc and cefepime  Subjective:    Ryan Turner relates his breathing is about the same.  Objective:    Vitals:   03/16/20 1935 03/16/20 2314 03/17/20 0445 03/17/20 0728  BP: (!) 153/83 139/81 (!) 151/90   Pulse: 99 80 89   Resp: 20 12 18    Temp: 98.7 F (37.1 C) 98.5 F (36.9 C) 98.2 F (36.8 C) 98.2 F (36.8 C)  TempSrc: Oral Oral Oral Oral  SpO2: 91% 97% 94%   Weight:      Height:       SpO2: 94 % O2 Flow Rate (L/min): 3 L/min   Intake/Output Summary (Last 24 hours) at 03/17/2020 0917 Last data filed at 03/17/2020 0728 Gross per 24 hour  Intake 1650.45 ml  Output 2900 ml  Net -1249.55 ml   Filed Weights   03/14/20 1225 03/15/20 0140  Weight: 111.1 kg 112 kg    Exam: General exam: In no acute distress. Respiratory system: Good air movement and clear to auscultation. Cardiovascular system: S1 & S2 heard, RRR. No JVD. Gastrointestinal system: Abdomen is nondistended, soft and nontender.  Extremities: No pedal edema. Skin: No rashes, lesions or ulcers  Data Reviewed:    Labs: Basic Metabolic Panel: Recent Labs  Lab 03/14/20 1247 03/14/20 1247 03/16/20 0053 03/17/20 0330  NA 133*  --  133* 134*  K 4.2   < > 4.3 4.8  CL 97*  --  96* 98  CO2 27  --  28 29  GLUCOSE 143*  --  124* 112*  BUN 11  --  13 7*  CREATININE 0.59*  --  0.84 0.68  CALCIUM 8.5*  --  8.2* 8.4*   < > = values in this interval not displayed.   GFR Estimated Creatinine Clearance: 111 mL/min (by C-G formula based on SCr of 0.68 mg/dL). Liver Function Tests: Recent Labs  Lab 03/14/20 1247  AST 66*  ALT 84*  ALKPHOS 123  BILITOT 0.7  PROT 6.5  ALBUMIN 2.7*   No results for input(s): LIPASE, AMYLASE in the last 168 hours. No results for input(s): AMMONIA in the last 168 hours. Coagulation profile No results for input(s): INR, PROTIME in the last 168 hours. COVID-19 Labs  No results  for input(s): DDIMER, FERRITIN, LDH, CRP in the last 72 hours.  Lab Results  Component Value Date   SARSCOV2NAA NEGATIVE 03/14/2020   SARSCOV2NAA DETECTED (A) 02/14/2020    CBC: Recent Labs  Lab 03/14/20 1247 03/15/20 1035 03/16/20 0053 03/17/20 0330  WBC 12.7* 9.0 9.1 7.3  NEUTROABS 11.4* 7.7 7.0 5.2  HGB 13.9 12.6* 12.7* 12.8*  HCT 42.0 38.1* 38.4* 39.5  MCV 96.3 97.2 96.2 96.1  PLT 225 218 244 247   Cardiac Enzymes: No results for input(s): CKTOTAL, CKMB, CKMBINDEX, TROPONINI in the last 168 hours. BNP (last 3 results) No results for input(s): PROBNP in the last 8760 hours. CBG: No results for input(s): GLUCAP in the last  168 hours. D-Dimer: No results for input(s): DDIMER in the last 72 hours. Hgb A1c: No results for input(s): HGBA1C in the last 72 hours. Lipid Profile: No results for input(s): CHOL, HDL, LDLCALC, TRIG, CHOLHDL, LDLDIRECT in the last 72 hours. Thyroid function studies: No results for input(s): TSH, T4TOTAL, T3FREE, THYROIDAB in the last 72 hours.  Invalid input(s): FREET3 Anemia work up: No results for input(s): VITAMINB12, FOLATE, FERRITIN, TIBC, IRON, RETICCTPCT in the last 72 hours. Sepsis Labs: Recent Labs  Lab 03/14/20 1247 03/15/20 1035 03/16/20 0053 03/17/20 0330  WBC 12.7* 9.0 9.1 7.3   Microbiology Recent Results (from the past 240 hour(s))  SARS Coronavirus 2 by RT PCR (hospital order, performed in [facility] hospital lab) Nasopharyngeal Nasopharyngeal Swab     Status: None   Collection Time: 03/14/20 12:56 PM   Specimen: Nasopharyngeal Swab  Result Value Ref Range Status   SARS Coronavirus 2 NEGATIVE NEGATIVE Final    Comment: (NOTE) SARS-CoV-2 target nucleic acids are NOT DETECTED.  The SARS-CoV-2 RNA is generally detectable in upper and lower respiratory specimens during the acute phase of infection. The lowest concentration of SARS-CoV-2 viral copies this assay can detect is 250 copies / mL. A negative result does not  preclude SARS-CoV-2 infection and should not be used as the sole basis for treatment or other patient management decisions.  A negative result may occur with improper specimen collection / handling, submission of specimen other than nasopharyngeal swab, presence of viral mutation(s) within the areas targeted by this assay, and inadequate number of viral copies (<250 copies / mL). A negative result must be combined with clinical observations, patient history, and epidemiological information.  Fact Sheet for Patients:   StrictlyIdeas.no  Fact Sheet for Healthcare Providers: BankingDealers.co.za  This test is not yet approved or  cleared by the [place] FDA and has been authorized for detection and/or diagnosis of SARS-CoV-2 by FDA under an Emergency Use Authorization (EUA).  This EUA will remain in effect (meaning this test can be used) for the duration of the COVID-19 declaration under Section 564(b)(1) of the Act, 21 U.S.C. section 360bbb-3(b)(1), unless the authorization is terminated or revoked sooner.  Performed at [facility], [address] [place], [place] [address]   Gram stain     Status: None   Collection Time: 03/14/20  2:17 PM   Specimen: Pleura; Body Fluid  Result Value Ref Range Status   Specimen Description PLEURAL  Final   Special Requests NONE  Final   Gram Stain   Final    GRAM POSITIVE COCCI Gram Stain Report Called to,Read Back By and Verified With: CARDWELL,L ON 03/14/20 AT 1640 BY LOY,C WBC PRESENT,BOTH PMN AND MONONUCLEAR CYTOSPIN SMEAR PERFORMED AT [facility] Performed at [facility], [address], [place], [place] [address]    Report Status 03/14/2020 FINAL  Final  Culture, body fluid-bottle     Status: Abnormal (Preliminary result)   Collection Time: 03/14/20  2:48 PM   Specimen: Pleura  Result Value Ref Range Status   Specimen Description   Final    PLEURAL Performed at [facility], [address] [place], [place] [address]    Special Requests   Final    10CC Performed at [facility], [address], [place],  [address]    Gram Stain   Final    GRAM POSITIVE COCCI IN BOTH AEROBIC AND ANAEROBIC BOTTLES Gram Stain Report Called to,Read Back By and Verified With: M DOSS,RN @0031  03/15/20 MKELLY Performed  at [facility], [address], [place], [place] [address]    Culture (A)  Final    STAPHYLOCOCCUS AUREUS SUSCEPTIBILITIES TO FOLLOW Performed at [facility], [place] [address], [place], [place] [address]    Report Status PENDING  Incomplete  MRSA PCR Screening     Status: None   Collection Time: 03/15/20 10:59 AM   Specimen: Nasopharyngeal  Result Value Ref Range Status   MRSA by PCR NEGATIVE NEGATIVE Final    Comment:        The GeneXpert MRSA Assay (FDA approved for NASAL specimens only), is one component of a comprehensive MRSA colonization surveillance program. It is not intended to diagnose MRSA infection nor to guide or monitor treatment for MRSA infections. Performed at [facility], [place] [address] [place], [place] [address]   Aerobic/Anaerobic Culture (surgical/deep wound)     Status: None (Preliminary result)   Collection Time: 03/15/20  4:15 PM   Specimen: Pleural Fluid  Result Value Ref Range Status   Specimen Description FLUID RIGHT PLEURAL  Final   Special Requests NONE  Final   Gram Stain   Final    RARE WBC PRESENT, PREDOMINANTLY PMN RARE GRAM POSITIVE COCCI    Culture   Final    TOO YOUNG TO READ Performed at [facility], [place] [address], [place], [place] [address]    Report Status PENDING  Incomplete     Medications:   . melatonin  3 mg Oral QHS  . predniSONE  20 mg Oral Q breakfast   Continuous Infusions: . sodium chloride 250 mL (03/15/20 0235)  . ceFEPime (MAXIPIME) IV Stopped (03/17/20 0108)  . heparin 1,800 Units/hr (03/16/20 2202)  . vancomycin Stopped (03/16/20 2137)      LOS: 3 days   Charlynne Cousins  [facility]  03/17/2020, 9:17 AM

## 2020-03-17 NOTE — Procedures (Signed)
Procedure Note  Procedure: Instillation of TPA/Dornase in chest tube  After verifying patient name and birth date as well as site. Chest tube stopcock side port was cleansed with chlorhexidine. Alteplase 10mg  and dornase 5mg  administered into the R pleural space via pigtail chest tube. Chest tube was clamped. Patient instructed to change position every 20 minutes. Chest tube unclamped after one hour at 10:50pm.  Patient tolerated procedure well without any complications  Jacques Earthly, M.D. [facility] Pulmonary/Critical Care Medicine After hours pager: [phone].

## 2020-03-17 NOTE — Progress Notes (Signed)
[place] for IV Heparin Indication: recent DVT  Allergies  Allergen Reactions  . Morphine     Other reaction(s): VOMITING    Patient Measurements: Height: 6\' 1"  (185.4 cm) Weight: 112 kg (246 lb 14.6 oz) IBW/kg (Calculated) : 79.9 Heparin Dosing Weight: 105 kg  Vital Signs: Temp: 98.2 F (36.8 C) (08/27 0728) Temp Source: Oral (08/27 0728) BP: 151/90 (08/27 0445) Pulse Rate: 89 (08/27 0445)  Labs: Recent Labs    03/14/20 1247 03/14/20 1247 03/15/20 0003 03/15/20 1035 03/15/20 1035 03/16/20 0053 03/17/20 0330  HGB 13.9   < >  --  12.6*   < > 12.7* 12.8*  HCT 42.0   < >  --  38.1*  --  38.4* 39.5  PLT 225   < >  --  218  --  244 247  APTT  --    < > 42* 62*  --  70* 71*  HEPARINUNFRC  --   --  0.88*  --   --  0.45 0.43  CREATININE 0.59*  --   --   --   --  0.84 0.68   < > = values in this interval not displayed.    Estimated Creatinine Clearance: 111 mL/min (by C-G formula based on SCr of 0.68 mg/dL).  Assessment: 69 years of age male on Apixaban prior to admission (last dose 03/14/20 at 0830) for recent DVT now transitioned to IV Heparin for possible procedures.   Heparin level therapeutic this AM CBC stable   Goal of Therapy:  Heparin level 0.3-0.7 units/ml aPTT 66-102 seconds Monitor platelets by anticoagulation protocol: Yes   Plan:  Continue IV Heparin to 1800 units/hr Follow up am labs - heparin level, CBC  Thank you Anette Guarneri, PharmD [phone] Please see amion for complete clinical pharmacist phone list 03/17/2020,8:00 AM

## 2020-03-17 NOTE — Progress Notes (Signed)
   NAME:  Ryan Turner, MRN:  [id number], DOB:  December 24, 1950, LOS: 3 ADMISSION DATE:  03/14/2020, CONSULTATION DATE:  03/14/20  REFERRING MD:  EDP, CHIEF COMPLAINT:  Sob/L hemothorax   Brief History   69 yowm active cigar smoker from [place] s/p covid 19 pna and steady improvement in activity tol p d/c on 2lpm rest/ 4lpm with activity and steroid taper then 3 d PTA with new sob/ R pleuritic cp and orthopnea > ED am 8/24 [facility] with ? Tension hydrothorax > emergent T centesis by IR yielded only 400 cc blood and pccm asked to evaluate with ddx = staph empyema.  Past Medical History   HBP  Significant Hospital Events   8/24 admit, thoracentesis 8/25 Pigtail by IR  Consults:  PCCM 8/24   Procedures:  R Thoracentesis 8/24  = 400 cc blood with glucose < 20 and LDH 2,777 and wbc > 10k with mostly polys  Significant Diagnostic Tests:  Venous dopplers 02/22/20 Findings consistent with acute deep vein thrombosis involving the left posterior tibial veins, and left peroneal veins  Venous doppler L side 8/24 >>> negative  Micro Data:  Covid 19  8/24  Neg  Pleural fluid 8/24 >>> MRSA   Antimicrobials:  maxepime 8/24 >>> vanc  8/24 >>>  Interim history/subjective:   Patient was given dornase and TPA yesterday, with improvement in x-ray chest this morning and chest tube outflow.  He feels better in terms of shortness of breath.  Objective   Blood pressure (!) 158/80, pulse 91, temperature 97.7 F (36.5 C), temperature source Oral, resp. rate (!) 27, height 6\' 1"  (1.854 m), weight 112 kg, SpO2 95 %.        Intake/Output Summary (Last 24 hours) at 03/17/2020 1345 Last data filed at 03/17/2020 1301 Gross per 24 hour  Intake 1650.45 ml  Output 3200 ml  Net -1549.55 ml   Filed Weights   03/14/20 1225 03/15/20 0140  Weight: 111.1 kg 112 kg      Physical exam: General: Chronically ill-appearing male, lying on the bed HEAENT: [place]/AT, eyes anicteric. Neuro: Alert, awake and oriented x3.   Following commands, moving all 4 extremities spontaneously  chest: Reduced air entry at the bases more on the right than left.  No wheezes or rhonchi Heart: Regular rate and rhythm, no murmurs or gallops Abdomen: Soft, nontender, nondistended, bowel sounds present Skin: No rash   Resolved Hospital Problem list     Assessment & Plan:  Loculated right effusion, empyema Evaluation by cardiothoracic surgery and he is not a candidate for VATS Patient was given DNase/TPA yesterday, x-ray chest was reviewed which was repeated this morning compared to yesterday showed improvement in right lower base. According to MIST-2 trial we will continue with DNase and TPA every 12 hours for 3 days CXR today reviewed with right effusion which is smaller but still appears loculated.   He has put out additional 890cc serosanguineous since yesterday He is at increased risk for bleeding given as he is on heparin for DVT. Risk/benefit discussed with patient and he has agreed to proceed Continue antibiotics  Best practice:  Per primary team  Signature:    Jacky Kindle MD Critical care physician [place]  Pager: [phone] Mobile: [phone]

## 2020-03-17 NOTE — Progress Notes (Signed)
Referring Physician(s): Gilford Raid  Supervising Physician: Dr. Annamaria Boots  Patient Status:  Ryan Turner - In-pt  Chief Complaint:  F/U Chest tube placement  Subjective:  Looks better today. Had thrombolytic instillation yesterday by PCCM Reported some right sided chest discomfort last night, seems to be a little better.  Allergies: Morphine  Medications: Current Outpatient Medications  Medication Instructions  . acetaminophen (TYLENOL) 650 mg, Oral, Every 6 hours PRN  . [START ON 04/08/2020] apixaban (ELIQUIS) 5 mg, Oral, 2 times daily  . celecoxib (CELEBREX) 200 mg, Oral, As needed  . clotrimazole-betamethasone (LOTRISONE) cream 1 application, Topical, 2 times daily  . docusate sodium (COLACE) 200 mg, Oral, Daily PRN  . fluticasone (FLONASE) 50 MCG/ACT nasal spray As needed  . Glucosamine-Chondroit-Vit C-Mn (GLUCOSAMINE CHONDROITIN COMPLX) CAPS 2 capsules, Oral, Daily, 1800 mg  . Multiple Vitamins-Minerals (OCUVITE PRESERVISION PO) 2 tablets, Oral, Daily  . Omega-3 Fatty Acids (FISH OIL) 1200 MG CAPS 2 capsules, Oral, Daily  . predniSONE (DELTASONE) 20 mg, Oral, Daily with breakfast  . [START ON 03/22/2020] predniSONE (DELTASONE) 10 mg, Oral, Daily with breakfast  . sildenafil (VIAGRA) 50-100 mg, Oral, Daily PRN  . traMADol (ULTRAM) 50 mg, Oral, Every 8 hours PRN  . zolpidem (AMBIEN) 5-10 mg, Oral, At bedtime PRN, for sleep      Vital Signs: BP (!) 158/80   Pulse 91   Temp 97.7 F (36.5 C) (Oral)   Resp (!) 27   Ht 6\' 1"  (1.854 m)   Wt 112 kg   SpO2 95%   BMI 32.58 kg/m   Physical Exam Constitutional:      Appearance: Normal appearance.  HENT:     Head: Normocephalic and atraumatic.  Cardiovascular:     Rate and Rhythm: Normal rate.  Pulmonary:     Effort: Pulmonary effort is normal. No respiratory distress.     Comments: Right side chest tube in place. Serosanguinous output Skin:    General: Skin is warm and dry.  Neurological:     General: No focal  deficit present.     Mental Status: He is alert and oriented to person, place, and time.  Psychiatric:        Mood and Affect: Mood normal.        Behavior: Behavior normal.        Thought Content: Thought content normal.        Judgment: Judgment normal.     Imaging: DG Chest 1 View  Result Date: 03/14/2020 CLINICAL DATA:  RIGHT pneumothorax post thoracentesis EXAM: CHEST  1 VIEW COMPARISON:  Extra tori AP chest radiograph 1451 hours compared to earlier study of 1254 hours FINDINGS: Enlargement of cardiac silhouette. BILATERAL pulmonary infiltrates consistent with history of recent COVID pneumonia. Again identified moderate to large RIGHT pleural effusion, accentuated by expiratory technique. Significant RIGHT basilar atelectasis. No pneumothorax following thoracentesis. IMPRESSION: No pneumothorax following RIGHT thoracentesis. Persistent RIGHT pleural effusion/hemothorax, RIGHT basilar atelectasis, and BILATERAL infiltrates. Electronically Signed   By: Lavonia Dana M.D.   On: 03/14/2020 15:28   CT Angio Chest PE W and/or Wo Contrast  Result Date: 03/14/2020 CLINICAL DATA:  Shortness of breath, COVID-19 positivity, recent right thoracentesis EXAM: CT ANGIOGRAPHY CHEST WITH CONTRAST TECHNIQUE: Multidetector CT imaging of the chest was performed using the standard protocol during bolus administration of intravenous contrast. Multiplanar CT image reconstructions and MIPs were obtained to evaluate the vascular anatomy. CONTRAST:  194mL OMNIPAQUE IOHEXOL 350 MG/ML SOLN COMPARISON:  Chest x-ray from earlier in the same  day. FINDINGS: Cardiovascular: Thoracic aorta demonstrates mild atherosclerotic calcification without aneurysmal dilatation or dissection. No significant cardiac enlargement is seen. Scattered coronary calcifications are noted. The pulmonary artery shows a normal branching pattern. Motion artifact limits evaluation of the lower lobe branches. No definitive intraluminal filling defect to  suggest pulmonary embolism is noted. Mediastinum/Nodes: Thoracic inlet is within normal limits. Scattered small hilar and mediastinal lymph nodes are noted likely reactive in nature. The esophagus as visualized is within normal limits. Lungs/Pleura: Patchy peripheral infiltrates are identified within both lungs consistent with the given clinical history of COVID-19 positivity. Additionally there is consolidation in the right lower lobe likely of a more compressive affect given the large right-sided pleural effusion. This persists despite recent thoracentesis. No pneumothorax is noted. Upper Abdomen: Visualized upper abdomen shows no acute abnormality. Musculoskeletal: No chest wall abnormality. No acute or significant osseous findings. Review of the MIP images confirms the above findings. IMPRESSION: Patchy bilateral airspace opacities consistent with the given clinical history of COVID-19 positivity. Large right-sided pleural effusion despite recent thoracentesis. This causes some compressive consolidation particularly in the right lower lobe. No pneumothorax is noted. No evidence of pulmonary emboli. Aortic Atherosclerosis (ICD10-I70.0). Electronically Signed   By: Inez Catalina M.D.   On: 03/14/2020 18:57   US Venous Img Lower Unilateral Left (DVT)  Result Date: 03/14/2020 CLINICAL DATA:  Shortness of breath EXAM: Left LOWER EXTREMITY VENOUS DOPPLER ULTRASOUND TECHNIQUE: Gray-scale sonography with compression, as well as color and duplex ultrasound, were performed to evaluate the deep venous system(s) from the level of the common femoral vein through the popliteal and proximal calf veins. COMPARISON:  None. FINDINGS: VENOUS Normal compressibility of the common femoral, superficial femoral, and popliteal veins, as well as the visualized calf veins. Visualized portions of profunda femoral vein and great saphenous vein unremarkable. No filling defects to suggest DVT on grayscale or color Doppler imaging. Doppler  waveforms show normal direction of venous flow, normal respiratory plasticity and response to augmentation. Limited views of the contralateral common femoral vein are unremarkable. OTHER None. Limitations: none IMPRESSION: Negative. Electronically Signed   By: Donavan Foil M.D.   On: 03/14/2020 17:53   DG CHEST PORT 1 VIEW  Result Date: 03/17/2020 CLINICAL DATA:  Loculated pleural effusion EXAM: PORTABLE CHEST 1 VIEW COMPARISON:  03/16/2020 FINDINGS: Moderate loculated right pleural effusion with indwelling pigtail drain. Course patchy opacities in the lungs bilaterally, compatible with multifocal pneumonia. No pneumothorax. The heart is top-normal in size. IMPRESSION: Moderate loculated right pleural effusion with indwelling pigtail drain. Multifocal pneumonia, grossly unchanged. Electronically Signed   By: Julian Hy M.D.   On: 03/17/2020 07:53   DG Chest Port 1 View  Result Date: 03/16/2020 CLINICAL DATA:  Shortness of breath EXAM: PORTABLE CHEST 1 VIEW COMPARISON:  03/16/2020 FINDINGS: Right basilar chest tube in place, unchanged. Small right pleural effusion. Extensive bilateral airspace disease, unchanged. Mild cardiomegaly. No pneumothorax or acute bony abnormality. IMPRESSION: Diffuse bilateral airspace disease, unchanged. Right basilar chest tube with small right pleural effusion. No pneumothorax. Electronically Signed   By: Rolm Baptise M.D.   On: 03/16/2020 17:32   DG CHEST PORT 1 VIEW  Result Date: 03/16/2020 CLINICAL DATA:  Shortness of breath, cough. EXAM: PORTABLE CHEST 1 VIEW COMPARISON:  March 14, 2020. FINDINGS: Stable cardiomegaly. Stable interstitial densities are noted throughout both lungs concerning for pulmonary edema or possibly pneumonia. Interval placement of pigtail drainage catheter into right lung base. Loculated right pleural effusion is slightly smaller in size. No pneumothorax  is noted. Bony thorax is unremarkable. IMPRESSION: Interval placement of pigtail  drainage catheter into right lung base. Loculated right pleural effusion is slightly smaller in size. Stable interstitial densities throughout both lungs concerning for pulmonary edema or possibly pneumonia. Electronically Signed   By: Marijo Conception M.D.   On: 03/16/2020 08:10   DG Chest Port 1 View  Result Date: 03/14/2020 CLINICAL DATA:  Increasing shortness of breath.  COVID positive. EXAM: PORTABLE CHEST 1 VIEW COMPARISON:  03/10/2020 FINDINGS: Interval increase in the size of a layering right pleural effusion, now large. No substantial left pleural effusion. Increased overlying opacities in the right lung. Cardiopericardial silhouette is similar to prior, but partially obscured. No discernible pneumothorax. Improved opacities in the left lung with improved aeration of the left lung. IMPRESSION: Interval increase in the size of a layering right pleural effusion, now large. Increased overlying opacities in the right lung may represent atelectasis, aspiration, and/or pneumonia. Improved aeration of the left lung. Electronically Signed   By: Margaretha Sheffield MD   On: 03/14/2020 13:10   CT IMAGE GUIDED DRAINAGE BY PERCUTANEOUS CATHETER  Result Date: 03/15/2020 INDICATION: 69 year old male with a history of right-sided empyema EXAM: CT GUIDED DRAINAGE OF CHEST ABSCESS MEDICATIONS: The patient is currently admitted to the hospital and receiving intravenous antibiotics. The antibiotics were administered within an appropriate time frame prior to the initiation of the procedure. ANESTHESIA/SEDATION: 1.0 mg IV Versed 50 mcg IV Fentanyl Moderate Sedation Time:  21 The patient was continuously monitored during the procedure by the interventional radiology nurse under my direct supervision. COMPLICATIONS: None TECHNIQUE: Informed written consent was obtained from the patient after a thorough discussion of the procedural risks, benefits and alternatives. All questions were addressed. Maximal Sterile Barrier  Technique was utilized including caps, mask, sterile gowns, sterile gloves, sterile drape, hand hygiene and skin antiseptic. A timeout was performed prior to the initiation of the procedure. PROCEDURE: The procedure, risks, benefits, and alternatives were explained to the patient/patient's family, who provided informed consent on the patient's behalf. Specific risks that were addressed included bleeding, infection, ongoing pneumothorax, need for further procedure/surgery, chance of hemorrhage, hemoptysis, cardiopulmonary collapse, death. Questions regarding the procedure were encouraged and answered. The patient understands and consents to the procedure. Patient was positioned in the prone position on the CT table and scout image of the chest was performed for planning purposes. Patient was prepped and draped in the usual sterile fashion. The skin and subcutaneous tissues were generously infiltrated 1% lidocaine for local anesthesia. A Yueh needle was then used to enter the pleural space with aspiration of fluid. The plastic Yueh catheter was advanced into the pleural space and an 035 guidewire was advanced to the apex of the lung under fluoroscopy. Dilation of the skin tract was performed over the wire, and then modified Seldinger technique was used to place a 12 French pigtail catheter. Catheter was attached to water seal chamber and suction was applied confirming a operational chest tube. Retention suture was placed.  Sterile dressing was placed. Patient tolerated the procedure well and remained hemodynamically stable throughout. No complications were encountered and no significant blood loss was encounter FINDINGS: Empyema of the left chest. At the completion, 12 French drain is within the pleural space. IMPRESSION: Status post right pleural drain for evacuation of empyema. Signed, Dulcy Fanny. Dellia Nims, RPVI [facility] Radiology Electronically Signed   By: Corrie Mckusick D.O.   On: 03/15/2020 17:46   US THORACENTESIS  ASP PLEURAL SPACE W/IMG GUIDE  Result Date: 03/14/2020 INDICATION: RIGHT pleural effusion EXAM: ULTRASOUND GUIDED DIAGNOSTIC AND THERAPEUTIC RIGHT THORACENTESIS MEDICATIONS: None. COMPLICATIONS: None immediate. PROCEDURE: An ultrasound guided thoracentesis was thoroughly discussed with the patient and questions answered. The benefits, risks, alternatives and complications were also discussed. The patient understands and wishes to proceed with the procedure. Written consent was obtained. Ultrasound was performed to localize and mark an adequate pocket of fluid in the posterior RIGHT chest. The area was then prepped and draped in the normal sterile fashion. 1% Lidocaine was used for local anesthesia. Under ultrasound guidance a 8 French thoracentesis catheter was introduced. Thoracentesis was performed. The catheter was removed and a dressing applied. FINDINGS: A total of approximately 400 mL of thick dark old bloody RIGHT pleural fluid was removed. Samples were sent to the laboratory as requested by the clinical team. IMPRESSION: Successful ultrasound guided RIGHT thoracentesis yielding 400 mL of dark old bloody pleural fluid. Electronically Signed   By: Lavonia Dana M.D.   On: 03/14/2020 15:27    Labs:  CBC: Recent Labs    03/14/20 1247 03/15/20 1035 03/16/20 0053 03/17/20 0330  WBC 12.7* 9.0 9.1 7.3  HGB 13.9 12.6* 12.7* 12.8*  HCT 42.0 38.1* 38.4* 39.5  PLT 225 218 244 247    COAGS: Recent Labs    03/15/20 0003 03/15/20 1035 03/16/20 0053 03/17/20 0330  APTT 42* 62* 70* 71*    BMP: Recent Labs    03/09/20 1659 03/14/20 1247 03/16/20 0053 03/17/20 0330  NA 139 133* 133* 134*  K 5.6* 4.2 4.3 4.8  CL 102 97* 96* 98  CO2 26 27 28 29   GLUCOSE 135* 143* 124* 112*  BUN 18 11 13  7*  CALCIUM 9.0 8.5* 8.2* 8.4*  CREATININE 0.64* 0.59* 0.84 0.68  GFRNONAA 99 >60 >60 >60  GFRAA 114 >60 >60 >60    LIVER FUNCTION  TESTS: Recent Labs    03/01/20 0147 03/01/20 0147 03/02/20 1135 03/03/20 0541 03/09/20 1659 03/14/20 1247  BILITOT 0.7   < > 0.8 0.8 0.5 0.7  AST 29   < > 20 24 25  66*  ALT 60*   < > 42 43 55* 84*  ALKPHOS 105  --  77 70  --  123  PROT 4.8*   < > 4.6* 4.6* 5.7* 6.5  ALBUMIN 2.4*  --  2.3* 2.2*  --  2.7*   < > = values in this interval not displayed.    Assessment and Plan: Large right pleural effusion despite recent thoracentesis. S/P placement of a 12 fr pigtail placed by Dr. Earleen Newport 8/25 Post thrombolytics instillation yesterday, seems to have improved output CT surgery and PCCM following.  Electronically Signed: Ascencion Dike, PA-C 03/17/2020, 12:46 PM

## 2020-03-18 ENCOUNTER — Inpatient Hospital Stay (HOSPITAL_COMMUNITY): Payer: BC Managed Care – PPO

## 2020-03-18 LAB — BASIC METABOLIC PANEL
Anion gap: 12 (ref 5–15)
BUN: 10 mg/dL (ref 8–23)
CO2: 27 mmol/L (ref 22–32)
Calcium: 8.5 mg/dL — ABNORMAL LOW (ref 8.9–10.3)
Chloride: 96 mmol/L — ABNORMAL LOW (ref 98–111)
Creatinine, Ser: 0.61 mg/dL (ref 0.61–1.24)
GFR calc Af Amer: >60 mL/min (ref >60)
GFR calc non Af Amer: >60 mL/min (ref >60)
Glucose, Bld: 152 mg/dL — ABNORMAL HIGH (ref 70–99)
Potassium: 3.9 mmol/L (ref 3.5–5.1)
Sodium: 135 mmol/L (ref 135–145)

## 2020-03-18 LAB — CBC WITH DIFFERENTIAL/PLATELET
Abs Immature Granulocytes: 0.29 10*3/uL — ABNORMAL HIGH (ref 0.00–0.07)
Basophils Absolute: 0 10*3/uL (ref 0.0–0.1)
Basophils Relative: 0 %
Eosinophils Absolute: 0.1 10*3/uL (ref 0.0–0.5)
Eosinophils Relative: 1 %
HCT: 39.3 % (ref 39.0–52.0)
Hemoglobin: 13 g/dL (ref 13.0–17.0)
Immature Granulocytes: 3 %
Lymphocytes Relative: 16 %
Lymphs Abs: 1.4 10*3/uL (ref 0.7–4.0)
MCH: 31.3 pg (ref 26.0–34.0)
MCHC: 33.1 g/dL (ref 30.0–36.0)
MCV: 94.7 fL (ref 80.0–100.0)
Monocytes Absolute: 0.7 10*3/uL (ref 0.1–1.0)
Monocytes Relative: 8 %
Neutro Abs: 6.4 10*3/uL (ref 1.7–7.7)
Neutrophils Relative %: 72 %
Platelets: 265 10*3/uL (ref 150–400)
RBC: 4.15 MIL/uL — ABNORMAL LOW (ref 4.22–5.81)
RDW: 14 % (ref 11.5–15.5)
WBC: 9 10*3/uL (ref 4.0–10.5)
nRBC: 0 % (ref 0.0–0.2)

## 2020-03-18 LAB — HEPARIN LEVEL (UNFRACTIONATED): Heparin Unfractionated: 0.21 IU/mL — ABNORMAL LOW (ref 0.30–0.70)

## 2020-03-18 MED ORDER — CEFAZOLIN SODIUM-DEXTROSE 2-4 GM/100ML-% IV SOLN: 2.0000 g | Freq: Three times a day (TID) | INTRAVENOUS | Status: DC

## 2020-03-18 MED ORDER — SODIUM CHLORIDE 0.9% FLUSH
10.0000 mL | Freq: Three times a day (TID) | INTRAVENOUS | Status: DC
  Administered 2020-03-18 – 2020-03-22 (×7): 10 mL

## 2020-03-18 NOTE — Progress Notes (Signed)
Spoke with Merlene Laughter NP and received order to hold tonight's dose of alteplase and dornase via chest tube

## 2020-03-18 NOTE — Progress Notes (Signed)
NAME:  Ryan Turner, MRN:  [id number], DOB:  [birth year], LOS: 4 ADMISSION DATE:  03/14/2020, CONSULTATION DATE:  03/14/20  REFERRING MD:  EDP, CHIEF COMPLAINT:  Sob/L hemothorax   Brief History   69 yowm active cigar smoker from [place] s/p covid 19 pna and steady improvement in activity tol p d/c on 2lpm rest/ 4lpm with activity and steroid taper then 3 d PTA with new sob/ R pleuritic cp and orthopnea > ED am 8/24 [facility] with ? Tension hydrothorax > emergent Thoracentesis by IR yielded only 400 cc blood and pccm asked to evaluate with ddx = staph empyema.  Past Medical History  HBP IBS HTN DDD COVID PNA 2021  Significant Hospital Events   8/24 admit, thoracentesis 8/25 Pigtail by IR  Consults:  PCCM 8/24   Procedures:  R Thoracentesis 8/24  = 400 cc blood with glucose < 20 and LDH 2,777 and wbc > 10k with mostly polys  Significant Diagnostic Tests:  PTA Venous dopplers 02/22/20 >Findings consistent with acute deep vein thrombosis involving the left posterior tibial veins, and left peroneal veins   Venous doppler L side 8/24 >>> negative  Micro Data:  Covid 19  8/24  Neg  Pleural fluid 8/24 >>> MRSA  Antimicrobials:  maxepime 8/24 >>> vanc  8/24 >>>  Interim history/subjective:  Patient seen lying in bed in no acute distress.  States he ambulated in hallway recently and reported less dyspnea during ambulation.  Objective   Blood pressure (!) 141/81, pulse 83, temperature 97.8 F (36.6 C), temperature source Oral, resp. rate 20, height 6\' 1"  (1.854 m), weight 112 kg, SpO2 95 %.        Intake/Output Summary (Last 24 hours) at 03/18/2020 1310 Last data filed at 03/18/2020 1304 Gross per 24 hour  Intake 1092.79 ml  Output 4275 ml  Net -3182.21 ml   Filed Weights   03/14/20 1225 03/15/20 0140  Weight: 111.1 kg 112 kg      Physical exam: General: Pleasant elderly gentleman lying in bed in no acute distress HEENT: [place]/AT MM pink/moist, PERRL, sclera  nonicteric Neuro: Alert and oriented x3, non-focal  CV: s1s2 regular rate and rhythm, no murmur, rubs, or gallops,  PULM:  Clear to ascultation with slight decreased sounds to base, pigtail chest tube in place GI: soft, bowel sounds active in all 4 quadrants, non-tender, non-distended Extremities: warm/dry, no edema  Skin: no rashes or lesions  Resolved Hospital Problem list     Assessment & Plan:  Loculated right effusion, empyema -Evaluation by cardiothoracic surgery and he is not a candidate for VATS -Patient was given DNase/TPA 8/27, x-ray chest was reviewed which was repeated this morning compared to yesterday showed improvement in right lower base. -According to MIST-2 trial we will continue with DNase and TPA every 12 hours for 3 days P: Repeat CXR continues to imporve Hold on any further TPA/dormase for now Continue to monitor chest tube output Routine chest tube care Flush pigtail chest tube per protocol  Monitor for sign of bleeding given current heparin requirement  Continue antibiotics  Repeat CXR in AM  Best practice:  Diet: heart healthy Pain/Anxiety/Delirium protocol (if indicated): As needed  VAP protocol (if indicated): N/A DVT prophylaxis: Heparin  GI prophylaxis: PPI Glucose control: Monitor  Mobility: Up with assistance  Code Status: Full Family Communication: Per primary Disposition: floor  Labs   CBC: Recent Labs  Lab 03/14/20 1247 03/15/20 1035 03/16/20 0053 03/17/20 0330 03/18/20 0209  WBC 12.7* 9.0  9.1 7.3 9.0  NEUTROABS 11.4* 7.7 7.0 5.2 6.4  HGB 13.9 12.6* 12.7* 12.8* 13.0  HCT 42.0 38.1* 38.4* 39.5 39.3  MCV 96.3 97.2 96.2 96.1 94.7  PLT 225 218 244 247 628    Basic Metabolic Panel: Recent Labs  Lab 03/14/20 1247 03/16/20 0053 03/17/20 0330 03/18/20 0209  NA 133* 133* 134* 135  K 4.2 4.3 4.8 3.9  CL 97* 96* 98 96*  CO2 27 28 29 27   GLUCOSE 143* 124* 112* 152*  BUN 11 13 7* 10  CREATININE 0.59* 0.84 0.68 0.61  CALCIUM  8.5* 8.2* 8.4* 8.5*   GFR: Estimated Creatinine Clearance: 111 mL/min (by C-G formula based on SCr of 0.61 mg/dL). Recent Labs  Lab 03/15/20 1035 03/16/20 0053 03/17/20 0330 03/18/20 0209  WBC 9.0 9.1 7.3 9.0    Liver Function Tests: Recent Labs  Lab 03/14/20 1247  AST 66*  ALT 84*  ALKPHOS 123  BILITOT 0.7  PROT 6.5  ALBUMIN 2.7*   No results for input(s): LIPASE, AMYLASE in the last 168 hours. No results for input(s): AMMONIA in the last 168 hours.  ABG    Component Value Date/Time   PHART 7.480 (H) 02/21/2020 0532   PCO2ART 42.3 02/21/2020 0532   PO2ART 46 (L) 02/21/2020 0532   HCO3 31.4 (H) 02/21/2020 0532   TCO2 33 (H) 02/21/2020 0532   O2SAT 84.0 02/21/2020 0532     Coagulation Profile: No results for input(s): INR, PROTIME in the last 168 hours.  Cardiac Enzymes: No results for input(s): CKTOTAL, CKMB, CKMBINDEX, TROPONINI in the last 168 hours.  HbA1C: No results found for: HGBA1C  CBG: No results for input(s): GLUCAP in the last 168 hours.    Signature:   Johnsie Cancel, NP-C [facility] Contact / Pager information can be found on Amion  03/18/2020, 1:20 PM

## 2020-03-18 NOTE — Progress Notes (Signed)
[facility] PROGRESS NOTE    Progress Note  Ryan Turner  VQQ:[id number] DOB: [birth year] DOA: 03/14/2020 PCP: [name], MD     Brief Narrative:   Ryan Turner is an 69 y.o. male past medical history significant for recent COVID-19 pneumonia, essential hypertension with a prolonged hospitalization recent discharge on 03/03/2020 for Covid pneumonia complicated by DVT which he completed treatment and was discharged home on steroid taper and Eliquis and 3 L of oxygen.  Main to the ED with increased difficulty breathing and with sats dropping into the 68%, and was diagnosed with empyema in the right pleural space.  Assessment/Plan:   Loculated empyema of right pleural space (HCC) CT surgery was consulted to pick tail catheter was inserted on 11/14/2019 by IR, and he has put out about 1800 through his catheter. Pulmonary and critical care was consulted and they are instilling TPA every 12 hours for the next 3 days he has had high risk of bleeding continue to monitor hemoglobin. Cultures grew MRSA he is currently on IV vancomycin, will discontinue cefepime for now. Continue IV narcotics for pain. Appreciate pulmonary and CT surgery's assistance. Hemoglobin is stable. He continues to be significantly negative he put about 3 L and a half of urine over the past 24 hours continue to monitor strict I's and O's closely creatinine seems to be stable along with vitals.  Left lower extremity DVT: IV heparin resume, for discharge we will put him back on his Eliquis.  Elevated blood pressure without a diagnosis of hypertension: She is currently on no antihypertensive medications at home.  History of a recent pneumonia due to COVID-19 virus Discharged on 3 to 6 L of oxygen, continue steroid taper.  DVT (deep venous thrombosis) (HCC) On IV heparin for now.   DVT prophylaxis: heparin Family Communication:none Status is: Inpatient  Remains inpatient appropriate  because:Hemodynamically unstable   Dispo: The patient is from: Home              Anticipated d/c is to: Home              Anticipated d/c date is: 2 days              Patient currently is not medically stable to d/c.  Code Status:     Code Status Orders  (From admission, onward)         Start     Ordered   03/14/20 2218  Do not attempt resuscitation (DNR)  Continuous       Question Answer Comment  In the event of cardiac or respiratory ARREST Do not call a "code blue"   In the event of cardiac or respiratory ARREST Do not perform Intubation, CPR, defibrillation or ACLS   In the event of cardiac or respiratory ARREST Use medication by any route, position, wound care, and other measures to relive pain and suffering. May use oxygen, suction and manual treatment of airway obstruction as needed for comfort.      03/14/20 2217        Code Status History    Date Active Date Inactive Code Status Order ID Comments User Context   02/26/2020 1529 03/04/2020 0009 DNR [id number]  Jonetta Osgood, MD Inpatient   02/20/2020 2230 02/26/2020 1529 Full Code [id number]  Rolla Plate, DO ED   Advance Care Planning Activity    Advance Directive Documentation     Most Recent Value  Type of Advance Directive Living will  Pre-existing out of facility  DNR order (yellow form or pink MOST form) --  "MOST" Form in Place? --        IV Access:    Peripheral IV   Procedures and diagnostic studies:   DG CHEST PORT 1 VIEW  Result Date: 03/17/2020 CLINICAL DATA:  Loculated pleural effusion EXAM: PORTABLE CHEST 1 VIEW COMPARISON:  03/16/2020 FINDINGS: Moderate loculated right pleural effusion with indwelling pigtail drain. Course patchy opacities in the lungs bilaterally, compatible with multifocal pneumonia. No pneumothorax. The heart is top-normal in size. IMPRESSION: Moderate loculated right pleural effusion with indwelling pigtail drain. Multifocal pneumonia, grossly unchanged. Electronically  Signed   By: Julian Hy M.D.   On: 03/17/2020 07:53   DG Chest Port 1 View  Result Date: 03/16/2020 CLINICAL DATA:  Shortness of breath EXAM: PORTABLE CHEST 1 VIEW COMPARISON:  03/16/2020 FINDINGS: Right basilar chest tube in place, unchanged. Small right pleural effusion. Extensive bilateral airspace disease, unchanged. Mild cardiomegaly. No pneumothorax or acute bony abnormality. IMPRESSION: Diffuse bilateral airspace disease, unchanged. Right basilar chest tube with small right pleural effusion. No pneumothorax. Electronically Signed   By: Rolm Baptise M.D.   On: 03/16/2020 17:32     Medical Consultants:    None.  Anti-Infectives:   vanc and cefepime  Subjective:    [name] relates his breathing is much improved compared to yesterday.  Objective:    Vitals:   03/17/20 1934 03/18/20 0007 03/18/20 0454 03/18/20 0825  BP: (!) 141/81 (!) 145/82 120/82 123/72  Pulse: 100 91 86 95  Resp: (!) 24 (!) 25 20 20   Temp: 98.7 F (37.1 C) 98.5 F (36.9 C) 98.3 F (36.8 C)   TempSrc: Oral Oral Oral   SpO2: 95% 94% 93% 93%  Weight:      Height:       SpO2: 93 % O2 Flow Rate (L/min): 4 L/min   Intake/Output Summary (Last 24 hours) at 03/18/2020 1018 Last data filed at 03/18/2020 0600 Gross per 24 hour  Intake 1092.79 ml  Output 3895 ml  Net -2802.21 ml   Filed Weights   03/14/20 1225 03/15/20 0140  Weight: 111.1 kg 112 kg    Exam: General exam: In no acute distress. Respiratory system: Good air movement and decreased air sounds on the right. Cardiovascular system: S1 & S2 heard, RRR. No JVD. Gastrointestinal system: Abdomen is nondistended, soft and nontender.  Extremities: No pedal edema. Skin: No rashes, lesions or ulcers  Data Reviewed:    Labs: Basic Metabolic Panel: Recent Labs  Lab 03/14/20 1247 03/14/20 1247 03/16/20 0053 03/16/20 0053 03/17/20 0330 03/18/20 0209  NA 133*  --  133*  --  134* 135  K 4.2   < > 4.3   < > 4.8 3.9  CL  97*  --  96*  --  98 96*  CO2 27  --  28  --  29 27  GLUCOSE 143*  --  124*  --  112* 152*  BUN 11  --  13  --  7* 10  CREATININE 0.59*  --  0.84  --  0.68 0.61  CALCIUM 8.5*  --  8.2*  --  8.4* 8.5*   < > = values in this interval not displayed.   GFR Estimated Creatinine Clearance: 111 mL/min (by C-G formula based on SCr of 0.61 mg/dL). Liver Function Tests: Recent Labs  Lab 03/14/20 1247  AST 66*  ALT 84*  ALKPHOS 123  BILITOT 0.7  PROT 6.5  ALBUMIN 2.7*  No results for input(s): LIPASE, AMYLASE in the last 168 hours. No results for input(s): AMMONIA in the last 168 hours. Coagulation profile No results for input(s): INR, PROTIME in the last 168 hours. COVID-19 Labs  No results for input(s): DDIMER, FERRITIN, LDH, CRP in the last 72 hours.  Lab Results  Component Value Date   SARSCOV2NAA NEGATIVE 03/14/2020   SARSCOV2NAA DETECTED (A) 02/14/2020    CBC: Recent Labs  Lab 03/14/20 1247 03/15/20 1035 03/16/20 0053 03/17/20 0330 03/18/20 0209  WBC 12.7* 9.0 9.1 7.3 9.0  NEUTROABS 11.4* 7.7 7.0 5.2 6.4  HGB 13.9 12.6* 12.7* 12.8* 13.0  HCT 42.0 38.1* 38.4* 39.5 39.3  MCV 96.3 97.2 96.2 96.1 94.7  PLT 225 218 244 247 265   Cardiac Enzymes: No results for input(s): CKTOTAL, CKMB, CKMBINDEX, TROPONINI in the last 168 hours. BNP (last 3 results) No results for input(s): PROBNP in the last 8760 hours. CBG: No results for input(s): GLUCAP in the last 168 hours. D-Dimer: No results for input(s): DDIMER in the last 72 hours. Hgb A1c: No results for input(s): HGBA1C in the last 72 hours. Lipid Profile: No results for input(s): CHOL, HDL, LDLCALC, TRIG, CHOLHDL, LDLDIRECT in the last 72 hours. Thyroid function studies: No results for input(s): TSH, T4TOTAL, T3FREE, THYROIDAB in the last 72 hours.  Invalid input(s): FREET3 Anemia work up: No results for input(s): VITAMINB12, FOLATE, FERRITIN, TIBC, IRON, RETICCTPCT in the last 72 hours. Sepsis Labs: Recent Labs   Lab 03/15/20 1035 03/16/20 0053 03/17/20 0330 03/18/20 0209  WBC 9.0 9.1 7.3 9.0   Microbiology Recent Results (from the past 240 hour(s))  SARS Coronavirus 2 by RT PCR (hospital order, performed in [facility] hospital lab) Nasopharyngeal Nasopharyngeal Swab     Status: None   Collection Time: 03/14/20 12:56 PM   Specimen: Nasopharyngeal Swab  Result Value Ref Range Status   SARS Coronavirus 2 NEGATIVE NEGATIVE Final    Comment: (NOTE) SARS-CoV-2 target nucleic acids are NOT DETECTED.  The SARS-CoV-2 RNA is generally detectable in upper and lower respiratory specimens during the acute phase of infection. The lowest concentration of SARS-CoV-2 viral copies this assay can detect is 250 copies / mL. A negative result does not preclude SARS-CoV-2 infection and should not be used as the sole basis for treatment or other patient management decisions.  A negative result may occur with improper specimen collection / handling, submission of specimen other than nasopharyngeal swab, presence of viral mutation(s) within the areas targeted by this assay, and inadequate number of viral copies (<250 copies / mL). A negative result must be combined with clinical observations, patient history, and epidemiological information.  Fact Sheet for Patients:   StrictlyIdeas.no  Fact Sheet for Healthcare Providers: BankingDealers.co.za  This test is not yet approved or  cleared by the [place] FDA and has been authorized for detection and/or diagnosis of SARS-CoV-2 by FDA under an Emergency Use Authorization (EUA).  This EUA will remain in effect (meaning this test can be used) for the duration of the COVID-19 declaration under Section 564(b)(1) of the Act, 21 U.S.C. section 360bbb-3(b)(1), unless the authorization is terminated or revoked sooner.  Performed at [facility], [address] [place], [place] [address]   Gram stain     Status:  None   Collection Time: 03/14/20  2:17 PM   Specimen: Pleura; Body Fluid  Result Value Ref Range Status   Specimen Description PLEURAL  Final   Special Requests NONE  Final   Gram  Stain   Final    GRAM POSITIVE COCCI Gram Stain Report Called to,Read Back By and Verified With: CARDWELL,L ON 03/14/20 AT 1640 BY LOY,C WBC PRESENT,BOTH PMN AND MONONUCLEAR CYTOSPIN SMEAR PERFORMED AT [facility] Performed at [facility], [address], [place], [place] [address]    Report Status 03/14/2020 FINAL  Final  Culture, body fluid-bottle     Status: Abnormal   Collection Time: 03/14/20  2:48 PM   Specimen: Pleura  Result Value Ref Range Status   Specimen Description   Final    PLEURAL Performed at [facility], [address] [place], [place] [address]    Special Requests   Final    10CC Performed at [facility], [address], [place], [place] [address]    Gram Stain   Final    GRAM POSITIVE COCCI IN BOTH AEROBIC AND ANAEROBIC BOTTLES Gram Stain Report Called to,Read Back By and Verified With: M DOSS,RN @0031  03/15/20 [facility] Performed at [facility], [address], [place], [place] [address]    Culture STAPHYLOCOCCUS AUREUS (A)  Final   Report Status 03/17/2020 FINAL  Final   Organism ID, Bacteria STAPHYLOCOCCUS AUREUS  Final      Susceptibility   Staphylococcus aureus - MIC*    CIPROFLOXACIN <=0.5 SENSITIVE Sensitive     ERYTHROMYCIN <=0.25 SENSITIVE Sensitive     GENTAMICIN <=0.5 SENSITIVE Sensitive     OXACILLIN <=0.25 SENSITIVE Sensitive     TETRACYCLINE <=1 SENSITIVE Sensitive     VANCOMYCIN <=0.5 SENSITIVE Sensitive     TRIMETH/SULFA <=10 SENSITIVE Sensitive     CLINDAMYCIN <=0.25 SENSITIVE Sensitive     RIFAMPIN <=0.5 SENSITIVE Sensitive     Inducible Clindamycin NEGATIVE Sensitive     * STAPHYLOCOCCUS AUREUS  MRSA PCR Screening     Status: None   Collection Time: 03/15/20 10:59 AM   Specimen: Nasopharyngeal  Result Value Ref Range Status   MRSA by PCR NEGATIVE NEGATIVE Final     Comment:        The GeneXpert MRSA Assay (FDA approved for NASAL specimens only), is one component of a comprehensive MRSA colonization surveillance program. It is not intended to diagnose MRSA infection nor to guide or monitor treatment for MRSA infections. Performed at [facility], [place] [address] [place], [place] [address]   Aerobic/Anaerobic Culture (surgical/deep wound)     Status: None (Preliminary result)   Collection Time: 03/15/20  4:15 PM   Specimen: Pleural Fluid  Result Value Ref Range Status   Specimen Description FLUID RIGHT PLEURAL  Final   Special Requests NONE  Final   Gram Stain   Final    RARE WBC PRESENT, PREDOMINANTLY PMN RARE GRAM POSITIVE COCCI Performed at [facility], [place] [address] [place], [place] [address]    Culture   Final    MODERATE STAPHYLOCOCCUS AUREUS SUSCEPTIBILITIES TO FOLLOW NO ANAEROBES ISOLATED; CULTURE IN PROGRESS FOR 5 DAYS    Report Status PENDING  Incomplete     Medications:   . alteplase (TPA) for intrapleural administration  10 mg Intrapleural Q12H   And  . pulmozyme (DORNASE) for intrapleural administration  5 mg Intrapleural Q12H  . melatonin  3 mg Oral QHS  . predniSONE  5 mg Oral Q breakfast   Continuous Infusions: . sodium chloride Stopped (03/17/20 1834)  . heparin 1,800 Units/hr (03/18/20 0752)  . vancomycin Stopped (03/17/20 2035)      LOS: 4 days   Charlynne Cousins  [facility]  03/18/2020, 10:18 AM

## 2020-03-18 NOTE — Progress Notes (Signed)
Pharmacy Antibiotic Note  Ryan Turner is a 69 y.o. male admitted on 03/14/2020 with pneumonia/empyema.  Pharmacy has been consulted for Vancomycin dosing.  Assessment: Patient's pleural fluid grew MSSA. Patient is afebrile and WBC are within normal limits.  Plan: Discontinue cefepime and vancomycin Initiate cefazolin 2g q8hr Follow repeat cultures Monitor renal function  Height: 6\' 1"  (185.4 cm) Weight: 112 kg (246 lb 14.6 oz) IBW/kg (Calculated) : 79.9  Temp (24hrs), Avg:98.5 F (36.9 C), Min:98.3 F (36.8 C), Max:98.7 F (37.1 C)  Recent Labs  Lab 03/14/20 1247 03/15/20 1035 03/16/20 0053 03/17/20 0330 03/18/20 0209  WBC 12.7* 9.0 9.1 7.3 9.0  CREATININE 0.59*  --  0.84 0.68 0.61    Estimated Creatinine Clearance: 111 mL/min (by C-G formula based on SCr of 0.61 mg/dL).    Allergies  Allergen Reactions  . Morphine     Other reaction(s): VOMITING    Antimicrobials this admission: Vanco 8/24 >> 8/28 Cefepime 8/24 >> 8/28 Cefazolin 8/28 >>   Microbiology results: 8/24 MRSA PCR: negative 8/24 pleural fluid: MSSA  Thank you for allowing pharmacy to be a part of this patient's care.  Norina Buzzard, PharmD PGY1 Pharmacy Resident 03/18/2020 12:25 PM

## 2020-03-18 NOTE — Progress Notes (Signed)
Patient requesting to ambulate, nursing staff assisted patient to ambulate in hallway 150 feet. with rolling walker. Patient short of breath but states "he feels good because he isnt gasping for air" back in bed call bell with in reach. Will monitor patient. Brianda Beitler, Bettina Gavia RN

## 2020-03-18 NOTE — Progress Notes (Signed)
Critical Care Medicine paged regarding timing of TPA in chest tube, Pharmacy will need to send [facility] with on call provider, she stated would let staff know when able to round. Will  monitor. Koryn Charlot, Bettina Gavia RN

## 2020-03-18 NOTE — Progress Notes (Signed)
Stage two buttock wounds, Foam placed to two areas of broken skin to buttocks. One on left buttock and the other on right. Skin is also broken between buttocks. Outside Skin surrounding sites reddened. Barrier cream placed to surrounding skin. Will monitor patient. Kanisha Duba, Bettina Gavia rN

## 2020-03-18 NOTE — Progress Notes (Signed)
Central for IV Heparin Indication: recent DVT  Allergies  Allergen Reactions  . Morphine     Other reaction(s): VOMITING    Patient Measurements: Height: 6\' 1"  (185.4 cm) Weight: 112 kg (246 lb 14.6 oz) IBW/kg (Calculated) : 79.9 Heparin Dosing Weight: 105 kg  Vital Signs: Temp: 98.3 F (36.8 C) (08/28 0454) Temp Source: Oral (08/28 0454) BP: 123/72 (08/28 0825) Pulse Rate: 95 (08/28 0825)  Labs: Recent Labs    03/16/20 0053 03/16/20 0053 03/17/20 0330 03/18/20 0209  HGB 12.7*   < > 12.8* 13.0  HCT 38.4*  --  39.5 39.3  PLT 244  --  247 265  APTT 70*  --  71*  --   HEPARINUNFRC 0.45  --  0.43 0.21*  CREATININE 0.84  --  0.68 0.61   < > = values in this interval not displayed.    Estimated Creatinine Clearance: 111 mL/min (by C-G formula based on SCr of 0.61 mg/dL).  Assessment: 69 years of age male on Apixaban prior to admission (last dose 03/14/20 at 0830) for recent DVT now transitioned to IV Heparin for possible procedures.   Heparin level subtherapeutic this AM, potentially due to delayed apixaban washout. I spoke with patient and there were no delays or issues with the heparin drip. Patient endorses no bleeding issues. I inspected his lines and all are attached and flowing appropriately. CBC stable   Goal of Therapy:  Heparin level 0.3-0.7 units/ml aPTT 66-102 seconds Monitor platelets by anticoagulation protocol: Yes   Plan:  Increase IV Heparin to 1850 units/hr Follow up am labs - heparin level, CBC  Norina Buzzard, PharmD PGY1 Pharmacy Resident 03/18/2020 12:01 PM  Please see amion for complete clinical pharmacist phone list

## 2020-03-19 ENCOUNTER — Inpatient Hospital Stay (HOSPITAL_COMMUNITY): Payer: BC Managed Care – PPO

## 2020-03-19 LAB — HEPARIN LEVEL (UNFRACTIONATED): Heparin Unfractionated: 0.4 IU/mL (ref 0.30–0.70)

## 2020-03-19 MED ORDER — PREDNISONE 5 MG PO TABS
2.5000 mg | ORAL_TABLET | Freq: Every day | ORAL | Status: AC
  Administered 2020-03-20: 2.5 mg via ORAL
  Filled 2020-03-19: qty 1

## 2020-03-19 MED ORDER — CEFAZOLIN SODIUM-DEXTROSE 2-4 GM/100ML-% IV SOLN
2.0000 g | Freq: Three times a day (TID) | INTRAVENOUS | Status: DC
  Administered 2020-03-19 – 2020-03-21 (×6): 2 g via INTRAVENOUS
  Filled 2020-03-19 (×6): qty 100

## 2020-03-19 NOTE — Progress Notes (Signed)
[facility] PROGRESS NOTE    Progress Note  Ryan Turner  IWP:[id number] DOB: 07/17/1951 DOA: 03/14/2020 PCP: [name], MD     Brief Narrative:   Ryan Turner is an 69 y.o. male past medical history significant for recent COVID-19 pneumonia, essential hypertension with a prolonged hospitalization recent discharge on 03/03/2020 for Covid pneumonia complicated by DVT which he completed treatment and was discharged home on steroid taper and Eliquis and 3 L of oxygen.  Main to the ED with increased difficulty breathing and with sats dropping into the 68%, and was diagnosed with empyema in the right pleural space.  Assessment/Plan:   Loculated empyema of right pleural space (HCC) CT surgery was consulted to pick tail catheter was inserted on 11/14/2019 by IR, and he has put out about 1800 through his catheter. Pulmonary and critical care was consulted and they are instilling TPA every 12 hours for the next 3 days he has had high risk of bleeding continue to monitor hemoglobin. Cultures grew MSSA antibiotic regimen deescalated to Ancef. Appreciate pulmonary and CT surgery's assistance. Chest tube output is decreasing.  Left lower extremity DVT: IV heparin resume, for discharge we will put him back on his Eliquis.  Elevated blood pressure without a diagnosis of hypertension: She is currently on no antihypertensive medications at home.  History of a recent pneumonia due to COVID-19 virus Discharged on 3 to 6 L of oxygen, continue steroid taper.  DVT (deep venous thrombosis) (HCC) On IV heparin for now.   DVT prophylaxis: heparin Family Communication:none Status is: Inpatient  Remains inpatient appropriate because:Hemodynamically unstable   Dispo: The patient is from: Home              Anticipated d/c is to: Home              Anticipated d/c date is: 2 days              Patient currently is not medically stable to d/c.  Code Status:     Code Status Orders   (From admission, onward)         Start     Ordered   03/14/20 2218  Do not attempt resuscitation (DNR)  Continuous       Question Answer Comment  In the event of cardiac or respiratory ARREST Do not call a "code blue"   In the event of cardiac or respiratory ARREST Do not perform Intubation, CPR, defibrillation or ACLS   In the event of cardiac or respiratory ARREST Use medication by any route, position, wound care, and other measures to relive pain and suffering. May use oxygen, suction and manual treatment of airway obstruction as needed for comfort.      03/14/20 2217        Code Status History    Date Active Date Inactive Code Status Order ID Comments User Context   02/26/2020 1529 03/04/2020 0009 DNR [id number]  Jonetta Osgood, MD Inpatient   02/20/2020 2230 02/26/2020 1529 Full Code [id number]  Rolla Plate, DO ED   Advance Care Planning Activity    Advance Directive Documentation     Most Recent Value  Type of Advance Directive Living will  Pre-existing out of facility DNR order (yellow form or pink MOST form) --  "MOST" Form in Place? --        IV Access:    Peripheral IV   Procedures and diagnostic studies:   DG CHEST PORT 1 VIEW  Result Date: 03/19/2020  CLINICAL DATA:  Chest tube in place. EXAM: PORTABLE CHEST 1 VIEW COMPARISON:  March 18, 2020 FINDINGS: The right chest tube is stable. An apparent loculated component of right pleural air is stable. Bilateral patchy pulmonary infiltrates, right greater than left, may be mildly worsened on the right but are stable on the left. Stable cardiomediastinal silhouette. No left-sided pneumothorax. IMPRESSION: 1. Right chest tube in stable position. Persistent apparent loculated component of right pneumothorax, small. No change. 2. Worsening patchy infiltrate on the right. Stable infiltrate on the left diffusely. Electronically Signed   By: Dorise Bullion III M.D   On: 03/19/2020 08:26   DG CHEST PORT 1  VIEW  Result Date: 03/18/2020 CLINICAL DATA:  Evaluate right-sided chest tube EXAM: PORTABLE CHEST 1 VIEW COMPARISON:  March 16, 2020 and March 17, 2020 FINDINGS: There may be a small loculated pneumothorax laterally on the right. This is similar compared to recent comparison. A right chest tube remains in place. Multifocal infiltrate consistent with pneumonia remains. No other interval changes. IMPRESSION: 1. Small right-sided pneumothorax, unchanged. Right chest tube remains in place. 2. Persistent diffuse bilateral pulmonary infiltrates. Electronically Signed   By: Dorise Bullion III M.D   On: 03/18/2020 14:20     Medical Consultants:    None.  Anti-Infectives:   vanc and cefepime  Subjective:    Ryan Turner relates his breathing continues to improve.  Objective:    Vitals:   03/18/20 2041 03/19/20 0006 03/19/20 0422 03/19/20 0815  BP: 109/77 (!) 144/79 127/76 119/80  Pulse: 92 81 89 92  Resp: (!) 21 19 20 19   Temp: 98.3 F (36.8 C) 98.2 F (36.8 C) 98.3 F (36.8 C) 98.2 F (36.8 C)  TempSrc: Oral Oral Oral Oral  SpO2: 93% 94% 93% 94%  Weight:      Height:       SpO2: 94 % O2 Flow Rate (L/min): 3 L/min FiO2 (%): 98 %   Intake/Output Summary (Last 24 hours) at 03/19/2020 0840 Last data filed at 03/19/2020 0500 Gross per 24 hour  Intake 515.76 ml  Output 1910 ml  Net -1394.24 ml   Filed Weights   03/14/20 1225 03/15/20 0140  Weight: 111.1 kg 112 kg    Exam: General exam: In no acute distress. Respiratory system: Good air movement and clear to auscultation. Cardiovascular system: S1 & S2 heard, RRR. No JVD. Gastrointestinal system: Abdomen is nondistended, soft and nontender.  Extremities: No pedal edema. Skin: No rashes, lesions or ulcers  Data Reviewed:    Labs: Basic Metabolic Panel: Recent Labs  Lab 03/14/20 1247 03/14/20 1247 03/16/20 0053 03/16/20 0053 03/17/20 0330 03/18/20 0209  NA 133*  --  133*  --  134* 135  K 4.2   < > 4.3    < > 4.8 3.9  CL 97*  --  96*  --  98 96*  CO2 27  --  28  --  29 27  GLUCOSE 143*  --  124*  --  112* 152*  BUN 11  --  13  --  7* 10  CREATININE 0.59*  --  0.84  --  0.68 0.61  CALCIUM 8.5*  --  8.2*  --  8.4* 8.5*   < > = values in this interval not displayed.   GFR Estimated Creatinine Clearance: 111 mL/min (by C-G formula based on SCr of 0.61 mg/dL). Liver Function Tests: Recent Labs  Lab 03/14/20 1247  AST 66*  ALT 84*  ALKPHOS 123  BILITOT  0.7  PROT 6.5  ALBUMIN 2.7*   No results for input(s): LIPASE, AMYLASE in the last 168 hours. No results for input(s): AMMONIA in the last 168 hours. Coagulation profile No results for input(s): INR, PROTIME in the last 168 hours. COVID-19 Labs  No results for input(s): DDIMER, FERRITIN, LDH, CRP in the last 72 hours.  Lab Results  Component Value Date   SARSCOV2NAA NEGATIVE 03/14/2020   SARSCOV2NAA DETECTED (A) 02/14/2020    CBC: Recent Labs  Lab 03/14/20 1247 03/15/20 1035 03/16/20 0053 03/17/20 0330 03/18/20 0209  WBC 12.7* 9.0 9.1 7.3 9.0  NEUTROABS 11.4* 7.7 7.0 5.2 6.4  HGB 13.9 12.6* 12.7* 12.8* 13.0  HCT 42.0 38.1* 38.4* 39.5 39.3  MCV 96.3 97.2 96.2 96.1 94.7  PLT 225 218 244 247 265   Cardiac Enzymes: No results for input(s): CKTOTAL, CKMB, CKMBINDEX, TROPONINI in the last 168 hours. BNP (last 3 results) No results for input(s): PROBNP in the last 8760 hours. CBG: No results for input(s): GLUCAP in the last 168 hours. D-Dimer: No results for input(s): DDIMER in the last 72 hours. Hgb A1c: No results for input(s): HGBA1C in the last 72 hours. Lipid Profile: No results for input(s): CHOL, HDL, LDLCALC, TRIG, CHOLHDL, LDLDIRECT in the last 72 hours. Thyroid function studies: No results for input(s): TSH, T4TOTAL, T3FREE, THYROIDAB in the last 72 hours.  Invalid input(s): FREET3 Anemia work up: No results for input(s): VITAMINB12, FOLATE, FERRITIN, TIBC, IRON, RETICCTPCT in the last 72 hours. Sepsis  Labs: Recent Labs  Lab 03/15/20 1035 03/16/20 0053 03/17/20 0330 03/18/20 0209  WBC 9.0 9.1 7.3 9.0   Microbiology Recent Results (from the past 240 hour(s))  SARS Coronavirus 2 by RT PCR (hospital order, performed in [facility] hospital lab) Nasopharyngeal Nasopharyngeal Swab     Status: None   Collection Time: 03/14/20 12:56 PM   Specimen: Nasopharyngeal Swab  Result Value Ref Range Status   SARS Coronavirus 2 NEGATIVE NEGATIVE Final    Comment: (NOTE) SARS-CoV-2 target nucleic acids are NOT DETECTED.  The SARS-CoV-2 RNA is generally detectable in upper and lower respiratory specimens during the acute phase of infection. The lowest concentration of SARS-CoV-2 viral copies this assay can detect is 250 copies / mL. A negative result does not preclude SARS-CoV-2 infection and should not be used as the sole basis for treatment or other patient management decisions.  A negative result may occur with improper specimen collection / handling, submission of specimen other than nasopharyngeal swab, presence of viral mutation(s) within the areas targeted by this assay, and inadequate number of viral copies (<250 copies / mL). A negative result must be combined with clinical observations, patient history, and epidemiological information.  Fact Sheet for Patients:   StrictlyIdeas.no  Fact Sheet for Healthcare Providers: BankingDealers.co.za  This test is not yet approved or  cleared by the [place] FDA and has been authorized for detection and/or diagnosis of SARS-CoV-2 by FDA under an Emergency Use Authorization (EUA).  This EUA will remain in effect (meaning this test can be used) for the duration of the COVID-19 declaration under Section 564(b)(1) of the Act, 21 U.S.C. section 360bbb-3(b)(1), unless the authorization is terminated or revoked sooner.  Performed at [facility], [address], [place], [place] [address]   Gram  stain     Status: None   Collection Time: 03/14/20  2:17 PM   Specimen: Pleura; Body Fluid  Result Value Ref Range Status   Specimen Description PLEURAL  Final  Special Requests NONE  Final   Gram Stain   Final    GRAM POSITIVE COCCI Gram Stain Report Called to,Read Back By and Verified With: CARDWELL,L ON 03/14/20 AT 1640 BY LOY,C WBC PRESENT,BOTH PMN AND MONONUCLEAR CYTOSPIN SMEAR PERFORMED AT [facility] Performed at [facility], [address], [place], [place] [address]    Report Status 03/14/2020 FINAL  Final  Culture, body fluid-bottle     Status: Abnormal   Collection Time: 03/14/20  2:48 PM   Specimen: Pleura  Result Value Ref Range Status   Specimen Description   Final    PLEURAL Performed at [facility], [address], [place], [place] [address]    Special Requests   Final    10CC Performed at [facility], [address], [place], [place] [address]    Gram Stain   Final    GRAM POSITIVE COCCI IN BOTH AEROBIC AND ANAEROBIC BOTTLES Gram Stain Report Called to,Read Back By and Verified With: M DOSS,RN @0031  03/15/20 [facility] Performed at [facility], [address] [place], [place] [address]    Culture STAPHYLOCOCCUS AUREUS (A)  Final   Report Status 03/17/2020 FINAL  Final   Organism ID, Bacteria STAPHYLOCOCCUS AUREUS  Final      Susceptibility   Staphylococcus aureus - MIC*    CIPROFLOXACIN <=0.5 SENSITIVE Sensitive     ERYTHROMYCIN <=0.25 SENSITIVE Sensitive     GENTAMICIN <=0.5 SENSITIVE Sensitive     OXACILLIN <=0.25 SENSITIVE Sensitive     TETRACYCLINE <=1 SENSITIVE Sensitive     VANCOMYCIN <=0.5 SENSITIVE Sensitive     TRIMETH/SULFA <=10 SENSITIVE Sensitive     CLINDAMYCIN <=0.25 SENSITIVE Sensitive     RIFAMPIN <=0.5 SENSITIVE Sensitive     Inducible Clindamycin NEGATIVE Sensitive     * STAPHYLOCOCCUS AUREUS  MRSA PCR Screening     Status: None   Collection Time: 03/15/20 10:59 AM   Specimen: Nasopharyngeal  Result Value Ref Range Status   MRSA by PCR  NEGATIVE NEGATIVE Final    Comment:        The GeneXpert MRSA Assay (FDA approved for NASAL specimens only), is one component of a comprehensive MRSA colonization surveillance program. It is not intended to diagnose MRSA infection nor to guide or monitor treatment for MRSA infections. Performed at [facility], [place] [address] [place], [place] [address]   Aerobic/Anaerobic Culture (surgical/deep wound)     Status: None (Preliminary result)   Collection Time: 03/15/20  4:15 PM   Specimen: Pleural Fluid  Result Value Ref Range Status   Specimen Description FLUID RIGHT PLEURAL  Final   Special Requests NONE  Final   Gram Stain   Final    RARE WBC PRESENT, PREDOMINANTLY PMN RARE GRAM POSITIVE COCCI Performed at [facility], [place] [address], [place], [place] [address]    Culture   Final    MODERATE STAPHYLOCOCCUS AUREUS NO ANAEROBES ISOLATED; CULTURE IN PROGRESS FOR 5 DAYS    Report Status PENDING  Incomplete   Organism ID, Bacteria STAPHYLOCOCCUS AUREUS  Final      Susceptibility   Staphylococcus aureus - MIC*    CIPROFLOXACIN <=0.5 SENSITIVE Sensitive     ERYTHROMYCIN <=0.25 SENSITIVE Sensitive     GENTAMICIN <=0.5 SENSITIVE Sensitive     OXACILLIN <=0.25 SENSITIVE Sensitive     TETRACYCLINE <=1 SENSITIVE Sensitive     VANCOMYCIN 1 SENSITIVE Sensitive     TRIMETH/SULFA <=10 SENSITIVE Sensitive     CLINDAMYCIN <=0.25 SENSITIVE Sensitive  RIFAMPIN <=0.5 SENSITIVE Sensitive     Inducible Clindamycin NEGATIVE Sensitive     * MODERATE STAPHYLOCOCCUS AUREUS     Medications:   . alteplase (TPA) for intrapleural administration  10 mg Intrapleural Q12H   And  . pulmozyme (DORNASE) for intrapleural administration  5 mg Intrapleural Q12H  . melatonin  3 mg Oral QHS  . predniSONE  5 mg Oral Q breakfast  . sodium chloride flush  10 mL Intracatheter Q8H   Continuous Infusions: . sodium chloride Stopped (03/17/20 1834)  .  ceFAZolin (ANCEF) IV    . heparin 1,850  Units/hr (03/18/20 2200)      LOS: 5 days   Charlynne Cousins  [facility]  03/19/2020, 8:40 AM

## 2020-03-19 NOTE — Progress Notes (Signed)
NAME:  Ryan Turner, MRN:  [id number], DOB:  August 30, 1950, LOS: 5 ADMISSION DATE:  03/14/2020, CONSULTATION DATE:  03/14/20  REFERRING MD:  EDP, CHIEF COMPLAINT:  Sob/L hemothorax   Brief History   69 yowm active cigar smoker from [place] s/p covid 19 pna and steady improvement in activity tol p d/c on 2lpm rest/ 4lpm with activity and steroid taper then 3 d PTA with new sob/ R pleuritic cp and orthopnea > ED am 8/24 [facility] with ? Tension hydrothorax > emergent Thoracentesis by IR yielded only 400 cc blood and pccm asked to evaluate with ddx = staph empyema.  Past Medical History  HBP IBS HTN DDD COVID PNA 2021  Significant Hospital Events   8/24 admit, thoracentesis 8/25 Pigtail by IR  Consults:  PCCM 8/24   Procedures:  R Thoracentesis 8/24  = 400 cc blood with glucose < 20 and LDH 2,777 and wbc > 10k with mostly polys  Significant Diagnostic Tests:  PTA Venous dopplers 02/22/20 >Findings consistent with acute deep vein thrombosis involving the left posterior tibial veins, and left peroneal veins   Venous doppler L side 8/24 >>> negative  Micro Data:  Covid 19  8/24  Neg  Pleural fluid 8/24 >>> MRSA  Antimicrobials:  maxepime 8/24 >>> vanc  8/24 >>>  Interim history/subjective:  Patient seen lying in bed in no acute distress.  States he ambulated in hallway recently and reported less dyspnea during ambulation.  Minimal chest tube drainage.   Objective   Blood pressure 126/89, pulse 89, temperature 98.9 F (37.2 C), temperature source Oral, resp. rate (!) 21, height 6\' 1"  (1.854 m), weight 112 kg, SpO2 96 %.    FiO2 (%):  [98 %] 98 %   Intake/Output Summary (Last 24 hours) at 03/19/2020 1838 Last data filed at 03/19/2020 1700 Gross per 24 hour  Intake 1030.04 ml  Output 1640 ml  Net -609.96 ml   Filed Weights   03/14/20 1225 03/15/20 0140  Weight: 111.1 kg 112 kg      Physical exam: General: Pleasant elderly gentleman lying in bed in no acute  distress HEENT: [place]/AT MM pink/moist, PERRL, sclera nonicteric Neuro: Alert and oriented x3, non-focal  CV: s1s2 regular rate and rhythm, no murmur, rubs, or gallops,  PULM:  Clear to ascultation with slight decreased sounds to base, pigtail chest tube in place GI: soft, bowel sounds active in all 4 quadrants, non-tender, non-distended Extremities: warm/dry, no edema  Skin: no rashes or lesions  Resolved Hospital Problem list     Assessment & Plan:  Loculated right effusion, empyema -Evaluation by cardiothoracic surgery and he is not a candidate for VATS -Patient was given DNase/TPA 8/27, x-ray chest was reviewed which was repeated this morning compared to yesterday showed improvement in right lower base. -According to MIST-2 trial we will continue with DNase and TPA every 12 hours for 3 days P: Repeat CT tomorrow Hold on any further TPA/dormase for now Continue to monitor chest tube output Routine chest tube care Flush pigtail chest tube per protocol  Monitor for sign of bleeding given current heparin requirement  Continue antibiotics  Repeat CXR in AM  Best practice:  Diet: heart healthy Pain/Anxiety/Delirium protocol (if indicated): As needed  VAP protocol (if indicated): N/A DVT prophylaxis: Heparin  GI prophylaxis: PPI Glucose control: Monitor  Mobility: Up with assistance  Code Status: Full Family Communication: Per primary Disposition: floor  Labs   CBC: Recent Labs  Lab 03/14/20 1247 03/15/20 1035 03/16/20  7001 03/17/20 0330 03/18/20 0209  WBC 12.7* 9.0 9.1 7.3 9.0  NEUTROABS 11.4* 7.7 7.0 5.2 6.4  HGB 13.9 12.6* 12.7* 12.8* 13.0  HCT 42.0 38.1* 38.4* 39.5 39.3  MCV 96.3 97.2 96.2 96.1 94.7  PLT 225 218 244 247 749    Basic Metabolic Panel: Recent Labs  Lab 03/14/20 1247 03/16/20 0053 03/17/20 0330 03/18/20 0209  NA 133* 133* 134* 135  K 4.2 4.3 4.8 3.9  CL 97* 96* 98 96*  CO2 27 28 29 27   GLUCOSE 143* 124* 112* 152*  BUN 11 13 7* 10   CREATININE 0.59* 0.84 0.68 0.61  CALCIUM 8.5* 8.2* 8.4* 8.5*   GFR: Estimated Creatinine Clearance: 111 mL/min (by C-G formula based on SCr of 0.61 mg/dL). Recent Labs  Lab 03/15/20 1035 03/16/20 0053 03/17/20 0330 03/18/20 0209  WBC 9.0 9.1 7.3 9.0    Liver Function Tests: Recent Labs  Lab 03/14/20 1247  AST 66*  ALT 84*  ALKPHOS 123  BILITOT 0.7  PROT 6.5  ALBUMIN 2.7*   No results for input(s): LIPASE, AMYLASE in the last 168 hours. No results for input(s): AMMONIA in the last 168 hours.  ABG    Component Value Date/Time   PHART 7.480 (H) 02/21/2020 0532   PCO2ART 42.3 02/21/2020 0532   PO2ART 46 (L) 02/21/2020 0532   HCO3 31.4 (H) 02/21/2020 0532   TCO2 33 (H) 02/21/2020 0532   O2SAT 84.0 02/21/2020 0532     Coagulation Profile: No results for input(s): INR, PROTIME in the last 168 hours.  Cardiac Enzymes: No results for input(s): CKTOTAL, CKMB, CKMBINDEX, TROPONINI in the last 168 hours.  HbA1C: No results found for: HGBA1C  CBG: No results for input(s): GLUCAP in the last 168 hours.    Kipp Brood, MD [facility] ICU Physician [place]  Pager: [phone] Mobile: [phone] After hours: [phone].  03/19/2020, 6:40 PM      03/19/2020, 6:38 PM

## 2020-03-19 NOTE — Evaluation (Signed)
Physical Therapy Evaluation Patient Details Name: Ryan Turner MRN: [id number] DOB: [birth year] Today's Date: 03/19/2020   History of Present Illness  Ryan Turner is an 69 y.o. male past medical history significant for recent COVID-19 pneumonia, essential hypertension with a prolonged hospitalization recent discharge on 03/03/2020 for Covid pneumonia complicated by DVT which he completed treatment and was discharged home on steroid taper and Eliquis and 3 L of oxygen.  Main to the ED with increased difficulty breathing and with sats dropping into the 68%, and was diagnosed with empyema in the right pleural space. now with chest tube  Clinical Impression   Pt admitted with above diagnosis. Comes from home where he was recovering from recent Covid infection; Lives with spouse in a one level home with level entry; Presents to PT with decr functional capacity; significant O2 sat drop with activity;  Pt currently with functional limitations due to the deficits listed below (see PT Problem List). Pt will benefit from skilled PT to increase their independence and safety with mobility to allow discharge to the venue listed below.       Follow Up Recommendations Home health PT    Equipment Recommendations  Rolling walker with 5" wheels    Recommendations for Other Services       Precautions / Restrictions Precautions Precautions: Fall;Other (comment) Precaution Comments: Watch O2 sats with amb; R chest tube      Mobility  Bed Mobility               General bed mobility comments: up in chair  Transfers Overall transfer level: Needs assistance Equipment used: Rolling walker (2 wheeled) Transfers: Sit to/from Stand Sit to Stand: Min guard         General transfer comment: supervision for line management; pt prefer RW usage due to feeling weak and unsteady  Ambulation/Gait Ambulation/Gait assistance: Min guard Gait Distance (Feet): 150 Feet Assistive device: Rolling  walker (2 wheeled) Gait Pattern/deviations: Step-through pattern;Decreased stride length     General Gait Details: Slow, steady gait with RW and supervision to monitor SpO2. Pt with SpO2 down to 83% (observed lowest) requiring 6L O2 [place]  Stairs            Wheelchair Mobility    Modified Rankin (Stroke Patients Only)       Balance     Sitting balance-Leahy Scale: Good       Standing balance-Leahy Scale: Fair                               Pertinent Vitals/Pain Pain Assessment: No/denies pain    Home Living Family/patient expects to be discharged to:: Private residence Living Arrangements: Spouse/significant other Available Help at Discharge: Family Type of Home: House Home Access: Level entry     Home Layout: One level Home Equipment: Civil engineer, contracting - built in      Prior Function Level of Independence: Independent         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, newspaper   Dominant Hand: Right    Extremity/Trunk Assessment   Upper Extremity Assessment Upper Extremity Assessment: Overall WFL for tasks assessed    Lower Extremity Assessment Lower Extremity Assessment: Generalized weakness       Communication   Communication: No difficulties  Cognition Arousal/Alertness: Awake/alert Behavior During Therapy: WFL for tasks assessed/performed Overall Cognitive Status: Within Functional Limits for tasks assessed  General Comments: motivated      General Comments General comments (skin integrity, edema, etc.): initiated amb on 4 L, O2 sats decr to low 80s; titrated O2 up to 6 L and noted recovery back to 90s    Exercises     Assessment/Plan    PT Assessment Patient needs continued PT services  PT Problem List Decreased strength;Decreased mobility;Decreased knowledge of precautions;Decreased activity tolerance;Cardiopulmonary status limiting activity;Decreased balance;Decreased  knowledge of use of DME       PT Treatment Interventions DME instruction;Therapeutic activities;Gait training;Therapeutic exercise;Patient/family education;Balance training;Functional mobility training    PT Goals (Current goals can be found in the Care Plan section)  Acute Rehab PT Goals Patient Stated Goal: recover and go home PT Goal Formulation: With patient Time For Goal Achievement: 04/02/20 Potential to Achieve Goals: Good    Frequency Min 3X/week   Barriers to discharge        Co-evaluation               AM-PAC PT "6 Clicks" Mobility  Outcome Measure Help needed turning from your back to your side while in a flat bed without using bedrails?: None Help needed moving from lying on your back to sitting on the side of a flat bed without using bedrails?: None Help needed moving to and from a bed to a chair (including a wheelchair)?: None Help needed standing up from a chair using your arms (e.g., wheelchair or bedside chair)?: None Help needed to walk in hospital room?: A Little Help needed climbing 3-5 steps with a railing? : A Lot 6 Click Score: 21    End of Session Equipment Utilized During Treatment: Oxygen Activity Tolerance: Patient tolerated treatment well Patient left: in chair;with call bell/phone within reach Nurse Communication: Mobility status PT Visit Diagnosis: Other abnormalities of gait and mobility (R26.89)    Time: 7342-8768 PT Time Calculation (min) (ACUTE ONLY): 23 min   Charges:   PT Evaluation $PT Eval Moderate Complexity: 1 Mod PT Treatments $Gait Training: 8-22 mins        Roney [place], PT  Acute Rehabilitation Services Pager [phone] Office [phone]   Colletta [place] 03/19/2020, 4:28 PM

## 2020-03-19 NOTE — Progress Notes (Signed)
[place] for IV Heparin Indication: recent DVT  Allergies  Allergen Reactions  . Morphine     Other reaction(s): VOMITING    Patient Measurements: Height: 6\' 1"  (185.4 cm) Weight: 112 kg (246 lb 14.6 oz) IBW/kg (Calculated) : 79.9 Heparin Dosing Weight: 105 kg  Vital Signs: Temp: 98.2 F (36.8 C) (08/29 0815) Temp Source: Oral (08/29 0815) BP: 119/80 (08/29 0815) Pulse Rate: 92 (08/29 0815)  Labs: Recent Labs    03/17/20 0330 03/18/20 0209 03/19/20 0350  HGB 12.8* 13.0  --   HCT 39.5 39.3  --   PLT 247 265  --   APTT 71*  --   --   HEPARINUNFRC 0.43 0.21* 0.40  CREATININE 0.68 0.61  --     Estimated Creatinine Clearance: 111 mL/min (by C-G formula based on SCr of 0.61 mg/dL).  Assessment: 69 years of age male on Apixaban prior to admission (last dose 03/14/20 at 0830) for recent DVT now transitioned to IV Heparin for possible procedures. APTT at goal 8/27.  Heparin level therapeutic. No signs of bleeding noted. CBC was not ordered today, has been stable for 5 days and within normal limits yesterday. Will follow CBC tomorrow with AM labs.   Goal of Therapy:  Heparin level 0.3-0.7 units/ml Monitor platelets by anticoagulation protocol: Yes   Plan:  Continue IV Heparin 1850 units/hr Follow daily labs - heparin level, CBC  Norina Buzzard, PharmD PGY1 Pharmacy Resident 03/19/2020 8:39 AM  Please see amion for complete clinical pharmacist phone list

## 2020-03-19 NOTE — Progress Notes (Signed)
Per Merlene Laughter NPC, Alteplase and Dornase will be held for this AM Dose. Will monitor patient Ryan Turner, Ryan Gavia RN

## 2020-03-20 LAB — HEPARIN LEVEL (UNFRACTIONATED): Heparin Unfractionated: 0.22 IU/mL — ABNORMAL LOW (ref 0.30–0.70)

## 2020-03-20 LAB — CBC
HCT: 41 % (ref 39.0–52.0)
Hemoglobin: 13.4 g/dL (ref 13.0–17.0)
MCH: 31.9 pg (ref 26.0–34.0)
MCHC: 32.7 g/dL (ref 30.0–36.0)
MCV: 97.6 fL (ref 80.0–100.0)
Platelets: 300 10*3/uL (ref 150–400)
RBC: 4.2 MIL/uL — ABNORMAL LOW (ref 4.22–5.81)
RDW: 13.9 % (ref 11.5–15.5)
WBC: 8.2 10*3/uL (ref 4.0–10.5)
nRBC: 0 % (ref 0.0–0.2)

## 2020-03-20 LAB — AEROBIC/ANAEROBIC CULTURE W GRAM STAIN (SURGICAL/DEEP WOUND)

## 2020-03-20 LAB — CREATININE, SERUM
Creatinine, Ser: 0.75 mg/dL (ref 0.61–1.24)
GFR calc Af Amer: >60 mL/min (ref >60)
GFR calc non Af Amer: >60 mL/min (ref >60)

## 2020-03-20 NOTE — Progress Notes (Signed)
Notified by CCMD that patient's HR went as high as 160. HR returned to 80-90's. Patient laying in bed on cell phone in no acute distress. Denies CP/SOB. Will continue to monitor

## 2020-03-20 NOTE — Progress Notes (Signed)
[facility] PROGRESS NOTE    Progress Note  Ryan Turner  XTK:[id number] DOB: Nov 19, 1950 DOA: 03/14/2020 PCP: [name], MD     Brief Narrative:   Ryan Turner is an 69 y.o. male past medical history significant for recent COVID-19 pneumonia, essential hypertension with a prolonged hospitalization recent discharge on 03/03/2020 for Covid pneumonia complicated by DVT which he completed treatment and was discharged home on steroid taper and Eliquis and 3 L of oxygen.  Main to the ED with increased difficulty breathing and with sats dropping into the 68%, and was diagnosed with empyema in the right pleural space.  Assessment/Plan:   Loculated empyema of right pleural space (HCC) CT surgery was consulted to pick tail catheter was inserted on 11/14/2019 by IR, and he has put out about 1800 through his catheter. Pulmonary and critical care was consulted and they are instilling TPA every 12 hours for the next 3 days he has had high risk of bleeding continue to monitor hemoglobin. Cultures grew MSSA continue antibiotics for 7 days Ancef. If output continues to decrease chest he can probably be DC'd on 03/21/2020. Appreciate pulmonary and CT surgery's assistance. Tube output continues to improve probably DC today. Hemoglobin stable, no signs of bleeding.  Left lower extremity DVT: IV heparin resume, for discharge we will put him back on his Eliquis.  Elevated blood pressure without a diagnosis of hypertension: She is currently on no antihypertensive medications at home.  History of a recent pneumonia due to COVID-19 virus Discharged on 3 to 6 L of oxygen, continue steroid taper.  DVT (deep venous thrombosis) (HCC) On IV heparin for now.   DVT prophylaxis: heparin Family Communication:none Status is: Inpatient  Remains inpatient appropriate because:Hemodynamically unstable   Dispo: The patient is from: Home              Anticipated d/c is to: Home               Anticipated d/c date is: 1 days              Patient currently is not medically stable to d/c.  Probably home tomorrow.  Once we DC the chest tube.  Code Status:     Code Status Orders  (From admission, onward)         Start     Ordered   03/14/20 2218  Do not attempt resuscitation (DNR)  Continuous       Question Answer Comment  In the event of cardiac or respiratory ARREST Do not call a "code blue"   In the event of cardiac or respiratory ARREST Do not perform Intubation, CPR, defibrillation or ACLS   In the event of cardiac or respiratory ARREST Use medication by any route, position, wound care, and other measures to relive pain and suffering. May use oxygen, suction and manual treatment of airway obstruction as needed for comfort.      03/14/20 2217        Code Status History    Date Active Date Inactive Code Status Order ID Comments User Context   02/26/2020 1529 03/04/2020 0009 DNR [id number]  Jonetta Osgood, MD Inpatient   02/20/2020 2230 02/26/2020 1529 Full Code [id number]  Rolla Plate, DO ED   Advance Care Planning Activity    Advance Directive Documentation     Most Recent Value  Type of Advance Directive Living will  Pre-existing out of facility DNR order (yellow form or pink MOST form) --  "MOST" Form  in Place? --        IV Access:    Peripheral IV   Procedures and diagnostic studies:   CT CHEST WO CONTRAST  Result Date: 03/19/2020 CLINICAL DATA:  Empyema EXAM: CT CHEST WITHOUT CONTRAST TECHNIQUE: Multidetector CT imaging of the chest was performed following the standard protocol without IV contrast. COMPARISON:  03/14/2020 FINDINGS: Cardiovascular: Heart is unremarkable without pericardial effusion. Normal caliber of the thoracic aorta. Stable atherosclerosis of the LAD distribution of the coronary vasculature. Mediastinum/Nodes: No pathologic adenopathy. Thyroid, esophagus, and trachea are grossly normal. Lungs/Pleura: Pigtail drainage catheter is  seen within the right posterior costophrenic angle. There has been near complete evacuation of the complex right pleural effusion since prior study, less than 100 cc remaining. Small amount of gas within the right pleural space likely sequela of drain placement. Trace free-flowing left pleural effusion, volume estimated less than 100 cc. Patchy bilateral areas of lung consolidation are again noted, consistent with multifocal pneumonia. Bilateral bronchiectasis again noted. Central airways are patent. Upper Abdomen: Limited imaging through the upper abdomen demonstrates no acute abnormalities. Musculoskeletal: No acute or destructive bony lesions. Reconstructed images demonstrate no additional findings. IMPRESSION: 1. Near complete evacuation of the complex right pleural effusion after pigtail drainage catheter placement. Less than 100 cc of fluid remaining. 2. Patchy bilateral areas of airspace disease consistent with multifocal pneumonia. 3. Trace left pleural effusion. 4. Bilateral bronchiectasis. Electronically Signed   By: Randa Ngo M.D.   On: 03/19/2020 21:57   DG CHEST PORT 1 VIEW  Result Date: 03/19/2020 CLINICAL DATA:  Chest tube in place. EXAM: PORTABLE CHEST 1 VIEW COMPARISON:  March 18, 2020 FINDINGS: The right chest tube is stable. An apparent loculated component of right pleural air is stable. Bilateral patchy pulmonary infiltrates, right greater than left, may be mildly worsened on the right but are stable on the left. Stable cardiomediastinal silhouette. No left-sided pneumothorax. IMPRESSION: 1. Right chest tube in stable position. Persistent apparent loculated component of right pneumothorax, small. No change. 2. Worsening patchy infiltrate on the right. Stable infiltrate on the left diffusely. Electronically Signed   By: Dorise Bullion III M.D   On: 03/19/2020 08:26   DG CHEST PORT 1 VIEW  Result Date: 03/18/2020 CLINICAL DATA:  Evaluate right-sided chest tube EXAM: PORTABLE CHEST 1  VIEW COMPARISON:  March 16, 2020 and March 17, 2020 FINDINGS: There may be a small loculated pneumothorax laterally on the right. This is similar compared to recent comparison. A right chest tube remains in place. Multifocal infiltrate consistent with pneumonia remains. No other interval changes. IMPRESSION: 1. Small right-sided pneumothorax, unchanged. Right chest tube remains in place. 2. Persistent diffuse bilateral pulmonary infiltrates. Electronically Signed   By: Dorise Bull[place] III M.D   On: 03/18/2020 14:20     Medical Consultants:    None.  Anti-Infectives:   vanc and cefepime  Subjective:    Ryan Turner his breathing continues to improve.  Objective:    Vitals:   03/19/20 2143 03/19/20 2350 03/20/20 0520 03/20/20 0816  BP: 124/71 132/79 131/80 131/72  Pulse: 91 88 84 96  Resp: 18 18 17 20   Temp: 98.9 F (37.2 C) 98.4 F (36.9 C) 98.2 F (36.8 C) 98.8 F (37.1 C)  TempSrc: Oral Oral Oral Oral  SpO2: 94% 96% 96% 95%  Weight:      Height:       SpO2: 95 % O2 Flow Rate (L/min): 3 L/min FiO2 (%): 98 %   Intake/Output Summary (  Last 24 hours) at 03/20/2020 0856 Last data filed at 03/20/2020 0522 Gross per 24 hour  Intake 1011.91 ml  Output 2530 ml  Net -1518.09 ml   Filed Weights   03/14/20 1225 03/15/20 0140  Weight: 111.1 kg 112 kg    Exam: General exam: In no acute distress. Respiratory system: Good air movement and clear to auscultation. Cardiovascular system: S1 & S2 heard, RRR. No JVD. Gastrointestinal system: Abdomen is nondistended, soft and nontender.  Extremities: No pedal edema. Skin: No rashes, lesions or ulcers  Data Reviewed:    Labs: Basic Metabolic Panel: Recent Labs  Lab 03/14/20 1247 03/14/20 1247 03/16/20 0053 03/16/20 0053 03/17/20 0330 03/18/20 0209 03/20/20 0736  NA 133*  --  133*  --  134* 135  --   K 4.2   < > 4.3   < > 4.8 3.9  --   CL 97*  --  96*  --  98 96*  --   CO2 27  --  28  --  29 27  --   GLUCOSE  143*  --  124*  --  112* 152*  --   BUN 11  --  13  --  7* 10  --   CREATININE 0.59*  --  0.84  --  0.68 0.61 0.75  CALCIUM 8.5*  --  8.2*  --  8.4* 8.5*  --    < > = values in this interval not displayed.   GFR Estimated Creatinine Clearance: 111 mL/min (by C-G formula based on SCr of 0.75 mg/dL). Liver Function Tests: Recent Labs  Lab 03/14/20 1247  AST 66*  ALT 84*  ALKPHOS 123  BILITOT 0.7  PROT 6.5  ALBUMIN 2.7*   No results for input(s): LIPASE, AMYLASE in the last 168 hours. No results for input(s): AMMONIA in the last 168 hours. Coagulation profile No results for input(s): INR, PROTIME in the last 168 hours. COVID-19 Labs  No results for input(s): DDIMER, FERRITIN, LDH, CRP in the last 72 hours.  Lab Results  Component Value Date   SARSCOV2NAA NEGATIVE 03/14/2020   SARSCOV2NAA DETECTED (A) 02/14/2020    CBC: Recent Labs  Lab 03/14/20 1247 03/14/20 1247 03/15/20 1035 03/16/20 0053 03/17/20 0330 03/18/20 0209 03/20/20 0736  WBC 12.7*   < > 9.0 9.1 7.3 9.0 8.2  NEUTROABS 11.4*  --  7.7 7.0 5.2 6.4  --   HGB 13.9   < > 12.6* 12.7* 12.8* 13.0 13.4  HCT 42.0   < > 38.1* 38.4* 39.5 39.3 41.0  MCV 96.3   < > 97.2 96.2 96.1 94.7 97.6  PLT 225   < > 218 244 247 265 300   < > = values in this interval not displayed.   Cardiac Enzymes: No results for input(s): CKTOTAL, CKMB, CKMBINDEX, TROPONINI in the last 168 hours. BNP (last 3 results) No results for input(s): PROBNP in the last 8760 hours. CBG: No results for input(s): GLUCAP in the last 168 hours. D-Dimer: No results for input(s): DDIMER in the last 72 hours. Hgb A1c: No results for input(s): HGBA1C in the last 72 hours. Lipid Profile: No results for input(s): CHOL, HDL, LDLCALC, TRIG, CHOLHDL, LDLDIRECT in the last 72 hours. Thyroid function studies: No results for input(s): TSH, T4TOTAL, T3FREE, THYROIDAB in the last 72 hours.  Invalid input(s): FREET3 Anemia work up: No results for input(s):  VITAMINB12, FOLATE, FERRITIN, TIBC, IRON, RETICCTPCT in the last 72 hours. Sepsis Labs: Recent Labs  Lab 03/16/20 [phone]  03/17/20 0330 03/18/20 0209 03/20/20 0736  WBC 9.1 7.3 9.0 8.2   Microbiology Recent Results (from the past 240 hour(s))  SARS Coronavirus 2 by RT PCR (hospital order, performed in [facility] hospital lab) Nasopharyngeal Nasopharyngeal Swab     Status: None   Collection Time: 03/14/20 12:56 PM   Specimen: Nasopharyngeal Swab  Result Value Ref Range Status   SARS Coronavirus 2 NEGATIVE NEGATIVE Final    Comment: (NOTE) SARS-CoV-2 target nucleic acids are NOT DETECTED.  The SARS-CoV-2 RNA is generally detectable in upper and lower respiratory specimens during the acute phase of infection. The lowest concentration of SARS-CoV-2 viral copies this assay can detect is 250 copies / mL. A negative result does not preclude SARS-CoV-2 infection and should not be used as the sole basis for treatment or other patient management decisions.  A negative result may occur with improper specimen collection / handling, submission of specimen other than nasopharyngeal swab, presence of viral mutation(s) within the areas targeted by this assay, and inadequate number of viral copies (<250 copies / mL). A negative result must be combined with clinical observations, patient history, and epidemiological information.  Fact Sheet for Patients:   StrictlyIdeas.no  Fact Sheet for Healthcare Providers: BankingDealers.co.za  This test is not yet approved or  cleared by the [place] FDA and has been authorized for detection and/or diagnosis of SARS-CoV-2 by FDA under an Emergency Use Authorization (EUA).  This EUA will remain in effect (meaning this test can be used) for the duration of the COVID-19 declaration under Section 564(b)(1) of the Act, 21 U.S.C. section 360bbb-3(b)(1), unless the authorization is terminated or revoked  sooner.  Performed at [facility], [address], [place], [place] [address]   Gram stain     Status: None   Collection Time: 03/14/20  2:17 PM   Specimen: Pleura; Body Fluid  Result Value Ref Range Status   Specimen Description PLEURAL  Final   Special Requests NONE  Final   Gram Stain   Final    GRAM POSITIVE COCCI Gram Stain Report Called to,Read Back By and Verified With: CARDWELL,L ON 03/14/20 AT 1640 BY LOY,C WBC PRESENT,BOTH PMN AND MONONUCLEAR CYTOSPIN SMEAR PERFORMED AT [facility] Performed at [facility], [name], [place], [place] [address]    Report Status 03/14/2020 FINAL  Final  Culture, body fluid-bottle     Status: Abnormal   Collection Time: 03/14/20  2:48 PM   Specimen: Pleura  Result Value Ref Range Status   Specimen Description   Final    PLEURAL Performed at [facility], [address], [place], [place] [address]    Special Requests   Final    10CC Performed at [facility] [address] [place], [place] [address]    Gram Stain   Final    GRAM POSITIVE COCCI IN BOTH AEROBIC AND ANAEROBIC BOTTLES Gram Stain Report Called to,Read Back By and Verified With: M DOSS,RN @0031  03/15/20 [facility] Performed at [facility], [address], [place], [place] [address]    Culture STAPHYLOCOCCUS AUREUS (A)  Final   Report Status 03/17/2020 FINAL  Final   Organism ID, Bacteria STAPHYLOCOCCUS AUREUS  Final      Susceptibility   Staphylococcus aureus - MIC*    CIPROFLOXACIN <=0.5 SENSITIVE Sensitive     ERYTHROMYCIN <=0.25 SENSITIVE Sensitive     GENTAMICIN <=0.5 SENSITIVE Sensitive     OXACILLIN <=0.25 SENSITIVE Sensitive     TETRACYCLINE <=1 SENSITIVE Sensitive  VANCOMYCIN <=0.5 SENSITIVE Sensitive     TRIMETH/SULFA <=10 SENSITIVE Sensitive     CLINDAMYCIN <=0.25 SENSITIVE Sensitive     RIFAMPIN <=0.5 SENSITIVE Sensitive     Inducible Clindamycin NEGATIVE Sensitive     * STAPHYLOCOCCUS AUREUS  MRSA PCR Screening     Status: None   Collection Time: 03/15/20  10:59 AM   Specimen: Nasopharyngeal  Result Value Ref Range Status   MRSA by PCR NEGATIVE NEGATIVE Final    Comment:        The GeneXpert MRSA Assay (FDA approved for NASAL specimens only), is one component of a comprehensive MRSA colonization surveillance program. It is not intended to diagnose MRSA infection nor to guide or monitor treatment for MRSA infections. Performed at [facility], [place] [address], [place], [place] [address]   Aerobic/Anaerobic Culture (surgical/deep wound)     Status: None (Preliminary result)   Collection Time: 03/15/20  4:15 PM   Specimen: Pleural Fluid  Result Value Ref Range Status   Specimen Description FLUID RIGHT PLEURAL  Final   Special Requests NONE  Final   Gram Stain   Final    RARE WBC PRESENT, PREDOMINANTLY PMN RARE GRAM POSITIVE COCCI Performed at [facility], [place] [address] [place], [place] [address]    Culture   Final    MODERATE STAPHYLOCOCCUS AUREUS NO ANAEROBES ISOLATED; CULTURE IN PROGRESS FOR 5 DAYS    Report Status PENDING  Incomplete   Organism ID, Bacteria STAPHYLOCOCCUS AUREUS  Final      Susceptibility   Staphylococcus aureus - MIC*    CIPROFLOXACIN <=0.5 SENSITIVE Sensitive     ERYTHROMYCIN <=0.25 SENSITIVE Sensitive     GENTAMICIN <=0.5 SENSITIVE Sensitive     OXACILLIN <=0.25 SENSITIVE Sensitive     TETRACYCLINE <=1 SENSITIVE Sensitive     VANCOMYCIN 1 SENSITIVE Sensitive     TRIMETH/SULFA <=10 SENSITIVE Sensitive     CLINDAMYCIN <=0.25 SENSITIVE Sensitive     RIFAMPIN <=0.5 SENSITIVE Sensitive     Inducible Clindamycin NEGATIVE Sensitive     * MODERATE STAPHYLOCOCCUS AUREUS     Medications:   . melatonin  3 mg Oral QHS  . predniSONE  2.5 mg Oral Q breakfast  . sodium chloride flush  10 mL Intracatheter Q8H   Continuous Infusions: . sodium chloride Stopped (03/17/20 1834)  .  ceFAZolin (ANCEF) IV 2 g (03/20/20 0522)  . heparin 1,850 Units/hr (03/20/20 5176)      LOS: 6 days   Charlynne Cousins  [facility]  03/20/2020, 8:56 AM

## 2020-03-20 NOTE — Progress Notes (Signed)
Pt ambulated x 250 feet with front wheel walker around the unit, pt tolerated well

## 2020-03-20 NOTE — Progress Notes (Signed)
NAME:  Ryan Turner, MRN:  [id number], DOB:  15-Feb-1951, LOS: 6 ADMISSION DATE:  03/14/2020, CONSULTATION DATE:  03/14/20  REFERRING MD:  EDP, CHIEF COMPLAINT:  Sob/L hemothorax   Brief History   69 yo male cigar smoker found to have COVID 19 pneumonia on 02/14/20 and was admitted to hospital from 02/20/20 to 03/03/20.  He was treated with steroids, remdesivir, and tocilizumab.  Course complicated by Lt leg DVT.  He was discharged home on 2 to 4 liters oxygen.  Had progressive dyspnea and found to have Rt pleural effusion.  Admitted to hospital on 03/14/20 and found to have MSSA Rt empyema.  Past Medical History  HTN, IBS, OA  Significant Hospital Events   8/24 admit, thoracentesis 8/25 Pigtail by IR 8/26 tPA/DNAse in chest tube 8/27 tPA/DNAse in chest tube  Consults:  IR Cardiothoracic surgery  Procedures:    Significant Diagnostic Tests:   Rt thoracentesis 8/24 >> 400 ml dark/bloody fluid, glucose < 20, protein 3.9, LDH 277, WBC > 10,000 (84% neutrophils), cytology negative  CT angio chest 8/24 >> patchy peripheral infiltrates, consolidation in RLL and large Rt pleural effusion  CT chest 8/29 >> near complete evacuation of complex Rt pleural effusion  Micro Data:  COVID 8/24 >> negative Rt pleural fluid 8/24 >> MSSA MRSA PCR 8/25 >> negative Rt pleural fluid 8/25 >> MSSA  Antimicrobials:  Cefepime 8/24 >>> 8/26 Vancomycin  8/24 >>> 8/27 Ancef 8/29 >>   Interim history/subjective:  Denies chest pain.  Cough better.  130 ml of fluid out from chest tube in past 24 hrs.  Objective   Blood pressure 131/72, pulse 96, temperature 98.8 F (37.1 C), temperature source Oral, resp. rate 20, height 6\' 1"  (1.854 m), weight 112 kg, SpO2 95 %.        Intake/Output Summary (Last 24 hours) at 03/20/2020 0844 Last data filed at 03/20/2020 0522 Gross per 24 hour  Intake 1011.91 ml  Output 2530 ml  Net -1518.09 ml   Filed Weights   03/14/20 1225 03/15/20 0140  Weight: 111.1 kg  112 kg      Physical exam:  General - alert Eyes - pupils reactive ENT - no sinus tenderness, no stridor Cardiac - regular rate/rhythm, no murmur Chest - b/l crackles, Rt chest tube in place Abdomen - soft, non tender, + bowel sounds Extremities - no cyanosis, clubbing, or edema Skin - no rashes Neuro - normal strength, moves extremities, follows commands Psych - normal mood and behavior   Resolved Hospital Problem list     Assessment & Plan:   Acute on chronic hypoxic respiratory failure MSSA HCAP with Rt empyema after recent COVID 19 pneumonia. - continue chest tube for additional day; don't think he needs additional tPA/DNAse at this time - f/u CXR and assess chest tube outpt on 8/31 >> if no further significant outpt, then likely can d/c chest tube 8/31 - day 7 of ABx, currently on ancef - goal SpO2 > 90%  Lt leg DVT. - should be okay to transition to eliquis since it is unlikely he will need additional pulmonary procedures at this time  Prednisone therapy. - defer tapering to primary team  Best practice:  Diet: heart healthy DVT prophylaxis: Heparin  GI prophylaxis: not indicated Mobility: as tolerated Code Status: DNR  Labs    CMP Latest Ref Rng & Units 03/20/2020 03/18/2020 03/17/2020  Glucose 70 - 99 mg/dL - 152(H) 112(H)  BUN 8 - 23 mg/dL - 10 7(L)  Creatinine  0.61 - 1.24 mg/dL 0.75 0.61 0.68  Sodium 135 - 145 mmol/L - 135 134(L)  Potassium 3.5 - 5.1 mmol/L - 3.9 4.8  Chloride 98 - 111 mmol/L - 96(L) 98  CO2 22 - 32 mmol/L - 27 29  Calcium 8.9 - 10.3 mg/dL - 8.5(L) 8.4(L)  Total Protein 6.5 - 8.1 g/dL - - -  Total Bilirubin 0.3 - 1.2 mg/dL - - -  Alkaline Phos 38 - 126 U/L - - -  AST 15 - 41 U/L - - -  ALT 0 - 44 U/L - - -    CBC Latest Ref Rng & Units 03/20/2020 03/18/2020 03/17/2020  WBC 4.0 - 10.5 K/uL 8.2 9.0 7.3  Hemoglobin 13.0 - 17.0 g/dL 13.4 13.0 12.8(L)  Hematocrit 39 - 52 % 41.0 39.3 39.5  Platelets 150 - 400 K/uL 300 265 247     Signature:  Chesley Mires, MD [place] Pager - [phone] 03/20/2020, 9:05 AM

## 2020-03-20 NOTE — Progress Notes (Signed)
[place] for IV Heparin Indication: recent DVT  Allergies  Allergen Reactions  . Morphine     Other reaction(s): VOMITING    Patient Measurements: Height: 6\' 1"  (185.4 cm) Weight: 112 kg (246 lb 14.6 oz) IBW/kg (Calculated) : 79.9 Heparin Dosing Weight: 105 kg  Vital Signs: Temp: 98.8 F (37.1 C) (08/30 0816) Temp Source: Oral (08/30 0816) BP: 131/72 (08/30 0816) Pulse Rate: 96 (08/30 0816)  Labs: Recent Labs    03/18/20 0209 03/19/20 0350 03/20/20 0736  HGB 13.0  --  13.4  HCT 39.3  --  41.0  PLT 265  --  300  HEPARINUNFRC 0.21* 0.40 0.22*  CREATININE 0.61  --  0.75    Estimated Creatinine Clearance: 111 mL/min (by C-G formula based on SCr of 0.75 mg/dL).  Assessment: 69 years of age male on Apixaban prior to admission (last dose 03/14/20 at 0830) for recent DVT now transitioned to IV Heparin for possible procedures  Heparin level low this morning at 0.22  Goal of Therapy:  Heparin level 0.3-0.7 units/ml Monitor platelets by anticoagulation protocol: Yes   Plan:  Increase heparin to 1950 units / hr Follow daily labs - heparin level, CBC  Back to apixaban at discharge  Thank you Anette Guarneri, PharmD 03/20/2020 8:46 AM  Please see amion for complete clinical pharmacist phone list

## 2020-03-20 NOTE — Progress Notes (Signed)
Referring Physician(s): Roddenberry, Harriet [place])  Supervising Physician: Daryll Brod  Patient Status:  [facility] - In-pt  Chief Complaint: None  Subjective:  History of SAYTK-16 pneumonia complicated by development of right empyema s/p right chest tube placement in IR 03/15/2020 by Dr. Earleen Newport. Patient awake and alert sitting in chair with no complaints at this time. Right chest tube site c/d/i.  CT chest 03/19/2020: 1. Near complete evacuation of the complex right pleural effusion after pigtail drainage catheter placement. Less than 100 cc of fluid remaining. 2. Patchy bilateral areas of airspace disease consistent with multifocal pneumonia. 3. Trace left pleural effusion. 4. Bilateral bronchiectasis.  CXR 03/19/2020: 1. Right chest tube in stable position. Persistent apparent loculated component of right pneumothorax, small. No change. 2. Worsening patchy infiltrate on the right. Stable infiltrate on the left diffusely.   Allergies: Morphine  Medications: Prior to Admission medications   Medication Sig Start Date End Date Taking? Authorizing Provider  acetaminophen (TYLENOL) 325 MG tablet Take 2 tablets (650 mg total) by mouth every 6 (six) hours as needed for mild pain or headache (fever >/= 101). 03/03/20  Yes Cherene Altes, MD  apixaban (ELIQUIS) 5 MG TABS tablet Take 1 tablet (5 mg total) by mouth 2 (two) times daily. 04/08/20  Yes Cherene Altes, MD  celecoxib (CELEBREX) 200 MG capsule TAKE 1 CAPSULE (200 MG TOTAL) BY MOUTH AS NEEDED. Patient taking differently: Take 200 mg by mouth daily as needed for mild pain.  01/04/19  Yes [name], MD  docusate sodium (COLACE) 100 MG capsule Take 200 mg by mouth daily as needed for mild constipation or moderate constipation.    Yes [provider]  Glucosamine-Chondroit-Vit C-Mn (GLUCOSAMINE CHONDROITIN COMPLX) CAPS Take 2 capsules by mouth daily. 1800 mg   Yes [provider]  Multiple  Vitamins-Minerals (OCUVITE PRESERVISION PO) Take 2 tablets by mouth daily.    Yes [provider]  Omega-3 Fatty Acids (FISH OIL) 1200 MG CAPS Take 2 capsules by mouth daily.   Yes [provider]  predniSONE (DELTASONE) 20 MG tablet Take 1 tablet (20 mg total) by mouth daily with breakfast for 7 days. 03/15/20 03/22/20 Yes Cherene Altes, MD  traMADol (ULTRAM) 50 MG tablet Take 1 tablet (50 mg total) by mouth every 8 (eight) hours as needed for up to 10 days. 03/13/20 03/23/20 Yes [name], MD  zolpidem (AMBIEN) 10 MG tablet Take 0.5-1 tablets (5-10 mg total) by mouth at bedtime as needed. for sleep 11/04/19  Yes [name], MD     Vital Signs: BP 128/79 (BP Location: Right Arm)   Pulse 86   Temp 98.8 F (37.1 C) (Oral)   Resp 18   Ht 6\' 1"  (1.854 m)   Wt 246 lb 14.6 oz (112 kg)   SpO2 99%   BMI 32.58 kg/m   Physical Exam Vitals and nursing note reviewed.  Constitutional:      General: He is not in acute distress. Pulmonary:     Effort: Pulmonary effort is normal. No respiratory distress.     Comments: Right chest tube site without tenderness, erythema, drainage, or active bleeding; approximately 1440 cc bloody fluid in pleure-vac; tube to water seal with (-) air leak. Skin:    General: Skin is warm and dry.  Neurological:     Mental Status: He is alert and oriented to person, place, and time.     Imaging: CT CHEST WO CONTRAST  Result Date:  03/19/2020 CLINICAL DATA:  Empyema EXAM: CT CHEST WITHOUT CONTRAST TECHNIQUE: Multidetector CT imaging of the chest was performed following the standard protocol without IV contrast. COMPARISON:  03/14/2020 FINDINGS: Cardiovascular: Heart is unremarkable without pericardial effusion. Normal caliber of the thoracic aorta. Stable atherosclerosis of the LAD distribution of the coronary vasculature. Mediastinum/Nodes: No pathologic adenopathy. Thyroid, esophagus, and trachea are grossly normal. Lungs/Pleura: Pigtail  drainage catheter is seen within the right posterior costophrenic angle. There has been near complete evacuation of the complex right pleural effusion since prior study, less than 100 cc remaining. Small amount of gas within the right pleural space likely sequela of drain placement. Trace free-flowing left pleural effusion, volume estimated less than 100 cc. Patchy bilateral areas of lung consolidation are again noted, consistent with multifocal pneumonia. Bilateral bronchiectasis again noted. Central airways are patent. Upper Abdomen: Limited imaging through the upper abdomen demonstrates no acute abnormalities. Musculoskeletal: No acute or destructive bony lesions. Reconstructed images demonstrate no additional findings. IMPRESSION: 1. Near complete evacuation of the complex right pleural effusion after pigtail drainage catheter placement. Less than 100 cc of fluid remaining. 2. Patchy bilateral areas of airspace disease consistent with multifocal pneumonia. 3. Trace left pleural effusion. 4. Bilateral bronchiectasis. Electronically Signed   By: Randa Ngo M.D.   On: 03/19/2020 21:57   DG CHEST PORT 1 VIEW  Result Date: 03/19/2020 CLINICAL DATA:  Chest tube in place. EXAM: PORTABLE CHEST 1 VIEW COMPARISON:  March 18, 2020 FINDINGS: The right chest tube is stable. An apparent loculated component of right pleural air is stable. Bilateral patchy pulmonary infiltrates, right greater than left, may be mildly worsened on the right but are stable on the left. Stable cardiomediastinal silhouette. No left-sided pneumothorax. IMPRESSION: 1. Right chest tube in stable position. Persistent apparent loculated component of right pneumothorax, small. No change. 2. Worsening patchy infiltrate on the right. Stable infiltrate on the left diffusely. Electronically Signed   By: Dorise Bullion III M.D   On: 03/19/2020 08:26   DG CHEST PORT 1 VIEW  Result Date: 03/18/2020 CLINICAL DATA:  Evaluate right-sided chest tube  EXAM: PORTABLE CHEST 1 VIEW COMPARISON:  March 16, 2020 and March 17, 2020 FINDINGS: There may be a small loculated pneumothorax laterally on the right. This is similar compared to recent comparison. A right chest tube remains in place. Multifocal infiltrate consistent with pneumonia remains. No other interval changes. IMPRESSION: 1. Small right-sided pneumothorax, unchanged. Right chest tube remains in place. 2. Persistent diffuse bilateral pulmonary infiltrates. Electronically Signed   By: Dorise Bullion III M.D   On: 03/18/2020 14:20   DG CHEST PORT 1 VIEW  Result Date: 03/17/2020 CLINICAL DATA:  Loculated pleural effusion EXAM: PORTABLE CHEST 1 VIEW COMPARISON:  03/16/2020 FINDINGS: Moderate loculated right pleural effusion with indwelling pigtail drain. Course patchy opacities in the lungs bilaterally, compatible with multifocal pneumonia. No pneumothorax. The heart is top-normal in size. IMPRESSION: Moderate loculated right pleural effusion with indwelling pigtail drain. Multifocal pneumonia, grossly unchanged. Electronically Signed   By: Julian Hy M.D.   On: 03/17/2020 07:53   DG Chest Port 1 View  Result Date: 03/16/2020 CLINICAL DATA:  Shortness of breath EXAM: PORTABLE CHEST 1 VIEW COMPARISON:  03/16/2020 FINDINGS: Right basilar chest tube in place, unchanged. Small right pleural effusion. Extensive bilateral airspace disease, unchanged. Mild cardiomegaly. No pneumothorax or acute bony abnormality. IMPRESSION: Diffuse bilateral airspace disease, unchanged. Right basilar chest tube with small right pleural effusion. No pneumothorax. Electronically Signed   By: Lennette Bihari  [place] M.D.   On: 03/16/2020 17:32    Labs:  CBC: Recent Labs    03/16/20 0053 03/17/20 0330 03/18/20 0209 03/20/20 0736  WBC 9.1 7.3 9.0 8.2  HGB 12.7* 12.8* 13.0 13.4  HCT 38.4* 39.5 39.3 41.0  PLT 244 247 265 300    COAGS: Recent Labs    03/15/20 0003 03/15/20 1035 03/16/20 0053 03/17/20 0330  APTT  42* 62* 70* 71*    BMP: Recent Labs    03/14/20 1247 03/14/20 1247 03/16/20 0053 03/17/20 0330 03/18/20 0209 03/20/20 0736  NA 133*  --  133* 134* 135  --   K 4.2  --  4.3 4.8 3.9  --   CL 97*  --  96* 98 96*  --   CO2 27  --  28 29 27   --   GLUCOSE 143*  --  124* 112* 152*  --   BUN 11  --  13 7* 10  --   CALCIUM 8.5*  --  8.2* 8.4* 8.5*  --   CREATININE 0.59*   < > 0.84 0.68 0.61 0.75  GFRNONAA >60   < > >60 >60 >60 >60  GFRAA >60   < > >60 >60 >60 >60   < > = values in this interval not displayed.    LIVER FUNCTION TESTS: Recent Labs    03/01/20 0147 03/01/20 0147 03/02/20 1135 03/03/20 0541 03/09/20 1659 03/14/20 1247  BILITOT 0.7   < > 0.8 0.8 0.5 0.7  AST 29   < > 20 24 25  66*  ALT 60*   < > 42 43 55* 84*  ALKPHOS 105  --  77 70  --  123  PROT 4.8*   < > 4.6* 4.6* 5.7* 6.5  ALBUMIN 2.4*  --  2.3* 2.2*  --  2.7*   < > = values in this interval not displayed.    Assessment and Plan:  History of XENMM-76 pneumonia complicated by development of right empyema s/p right chest tube placement in IR 03/15/2020 by Dr. Earleen Newport. Right chest tube stable with approximately 1440 cc bloody fluid in pleure-vac. Continue current chest tube management- continue with Qshift monitor of output, tube to remain to water seal per CCM. For possible chest tube removal tomorrow pending output/AM CXR per CCM. Further plans per TRH/CCM/TCTS- appreciate and agree with management. IR to follow.   Electronically Signed: Earley Abide, PA-C 03/20/2020, 1:13 PM   I spent a total of 25 Minutes at the the patient's bedside AND on the patient's hospital floor or unit, greater than 50% of which was counseling/coordinating care for right empyema s/p right chest tube placement.

## 2020-03-20 NOTE — Plan of Care (Signed)
  Problem: Health Behavior/Discharge Planning: Goal: Ability to manage health-related needs will improve Outcome: Progressing   Problem: Clinical Measurements: Goal: Will remain free from infection Outcome: Progressing Goal: Diagnostic test results will improve Outcome: Progressing   

## 2020-03-21 ENCOUNTER — Inpatient Hospital Stay (HOSPITAL_COMMUNITY): Payer: BC Managed Care – PPO

## 2020-03-21 LAB — CBC
HCT: 40.1 % (ref 39.0–52.0)
Hemoglobin: 13.1 g/dL (ref 13.0–17.0)
MCH: 31.6 pg (ref 26.0–34.0)
MCHC: 32.7 g/dL (ref 30.0–36.0)
MCV: 96.6 fL (ref 80.0–100.0)
Platelets: 239 10*3/uL (ref 150–400)
RBC: 4.15 MIL/uL — ABNORMAL LOW (ref 4.22–5.81)
RDW: 13.8 % (ref 11.5–15.5)
WBC: 8.2 10*3/uL (ref 4.0–10.5)
nRBC: 0 % (ref 0.0–0.2)

## 2020-03-21 LAB — HEPARIN LEVEL (UNFRACTIONATED): Heparin Unfractionated: 0.27 IU/mL — ABNORMAL LOW (ref 0.30–0.70)

## 2020-03-21 MED ORDER — CEPHALEXIN 500 MG PO CAPS
500.0000 mg | ORAL_CAPSULE | Freq: Three times a day (TID) | ORAL | Status: DC
  Administered 2020-03-21 – 2020-03-22 (×4): 500 mg via ORAL
  Filled 2020-03-21 (×4): qty 1

## 2020-03-21 NOTE — Progress Notes (Signed)
Physical Therapy Treatment Patient Details Name: Ryan Turner MRN: [id number] DOB: 12-10-1950 Today's Date: 03/21/2020    History of Present Illness Ryan Turner is an 69 y.o. male past medical history significant for recent COVID-19 pneumonia, essential hypertension with a prolonged hospitalization recent discharge on 03/03/2020 for Covid pneumonia complicated by DVT which he completed treatment and was discharged home on steroid taper and Eliquis and 3 L of oxygen.  Main to the ED with increased difficulty breathing and with sats dropping into the 68%, and was diagnosed with empyema in the right pleural space. now with chest tube    PT Comments    Patient progressing well towards PT goals. Improved ambulation distance with Min guard progressing to supervision for safety with use of RW for support. Sp02 dropped to 84% on 3-4L.min 02 [place] requiring forced standing rest break to recover with cues for pursed lip breathing. Encouraged walking 2 more times with nursing staff today. Reviewed energy conservation techniques and self monitoring for symptoms. Will follow.    Follow Up Recommendations  Home health PT;Supervision - Intermittent     Equipment Recommendations  Rolling walker with 5" wheels    Recommendations for Other Services       Precautions / Restrictions Precautions Precautions: Fall;Other (comment) Precaution Comments: Watch O2 sats with amb; Rt chest tube Restrictions Weight Bearing Restrictions: No    Mobility  Bed Mobility Overal bed mobility: Needs Assistance Bed Mobility: Supine to Sit     Supine to sit: Supervision;HOB elevated     General bed mobility comments: No assist needed.  Transfers Overall transfer level: Needs assistance Equipment used: Rolling walker (2 wheeled) Transfers: Sit to/from Stand Sit to Stand: Supervision         General transfer comment: Supervision for safety. Stood from [employer], transferred to chair post  ambulation.  Ambulation/Gait Ambulation/Gait assistance: Min guard;Supervision Gait Distance (Feet): 350 Feet   Gait Pattern/deviations: Step-through pattern;Decreased stride length;Trunk flexed Gait velocity: good speed Gait velocity interpretation: 1.31 - 2.62 ft/sec, indicative of limited community ambulator General Gait Details: Mostly steady gait with RW; SP02 dropped to 84% on 3L, increased to 4L and Sp02 ranged from 85-92% with activity; 1 forced standing rest break.   Stairs             Wheelchair Mobility    Modified Rankin (Stroke Patients Only)       Balance Overall balance assessment: Needs assistance Sitting-balance support: Feet supported;No upper extremity supported Sitting balance-Leahy Scale: Good     Standing balance support: During functional activity Standing balance-Leahy Scale: Fair Standing balance comment: Stability improved with RW                            Cognition Arousal/Alertness: Awake/alert Behavior During Therapy: WFL for tasks assessed/performed Overall Cognitive Status: Within Functional Limits for tasks assessed                                 General Comments: motivated; needs cues to self monitor for symptoms      Exercises      General Comments General comments (skin integrity, edema, etc.): SP02 dropped to 84% on 3-4L/min 02 [place], able to recover with rest and breathing.      Pertinent Vitals/Pain Pain Assessment: No/denies pain    Home Living  Prior Function            PT Goals (current goals can now be found in the care plan section) Progress towards PT goals: Progressing toward goals    Frequency    Min 3X/week      PT Plan Current plan remains appropriate    Co-evaluation              AM-PAC PT "6 Clicks" Mobility   Outcome Measure  Help needed turning from your back to your side while in a flat bed without using bedrails?: None Help  needed moving from lying on your back to sitting on the side of a flat bed without using bedrails?: None Help needed moving to and from a bed to a chair (including a wheelchair)?: None Help needed standing up from a chair using your arms (e.g., wheelchair or bedside chair)?: None Help needed to walk in hospital room?: A Little Help needed climbing 3-5 steps with a railing? : A Lot 6 Click Score: 21    End of Session Equipment Utilized During Treatment: Oxygen Activity Tolerance: Treatment limited secondary to medical complications (Comment);Patient tolerated treatment well (drop in SP02) Patient left: in chair;with call bell/phone within reach Nurse Communication: Mobility status PT Visit Diagnosis: Other abnormalities of gait and mobility (R26.89)     Time: 0034-9179 PT Time Calculation (min) (ACUTE ONLY): 20 min  Charges:  $Therapeutic Exercise: 8-22 mins                     Marisa Severin, PT, DPT Acute Rehabilitation Services Pager [phone] Office [place] 03/21/2020, 10:26 AM

## 2020-03-21 NOTE — Telephone Encounter (Signed)
lmtcb for pt. Pt wants sooner consult appt than 9/20.

## 2020-03-21 NOTE — Plan of Care (Signed)
°  Problem: Clinical Measurements: °Goal: Will remain free from infection °Outcome: Progressing °Goal: Respiratory complications will improve °Outcome: Progressing °  °

## 2020-03-21 NOTE — Progress Notes (Addendum)
[facility] PROGRESS NOTE    Progress Note  Ryan Turner  MOQ:[id number] DOB: 07-Aug-1950 DOA: 03/14/2020 PCP: [name], MD     Brief Narrative:   Ryan Turner is an 69 y.o. male past medical history significant for recent COVID-19 pneumonia, essential hypertension with a prolonged hospitalization recent discharge on 03/03/2020 for Covid pneumonia complicated by DVT which he completed treatment and was discharged home on steroid taper and Eliquis and 3 L of oxygen.  Main to the ED with increased difficulty breathing and with sats dropping into the 68%, and was diagnosed with empyema in the right pleural space.  Assessment/Plan:   Loculated empyema of right pleural space (HCC) CT surgery was consulted to pick tail catheter was inserted on 11/14/2019 by IR. Pulmonary was consulted.  Empyema was treated with TPA which is now resolved. Chest tube to be removed on 03/21/2020 were reviewed I chest x-ray.  Cultures grew MSSA continue antibiotics for for 2 weeks as an outpatient. Appreciate pulmonary and CT surgery's assistance.  Left lower extremity DVT: IV heparin resume, for discharge we will put him back on his Eliquis.  Elevated blood pressure without a diagnosis of hypertension: She is currently on no antihypertensive medications at home.  History of a recent pneumonia due to COVID-19 virus Discharged on 3 to 6 L of oxygen, continue steroid taper.  DVT (deep venous thrombosis) (HCC) On IV heparin for now.   DVT prophylaxis: heparin Family Communication:none Status is: Inpatient  Remains inpatient appropriate because:Hemodynamically unstable   Dispo: The patient is from: Home              Anticipated d/c is to: Home              Anticipated d/c date is: 1 days              Patient currently is not medically stable to d/c.  Probably home tomorrow.  Once we DC the chest tube.  Code Status:     Code Status Orders  (From admission, onward)          Start     Ordered   03/14/20 2218  Do not attempt resuscitation (DNR)  Continuous       Question Answer Comment  In the event of cardiac or respiratory ARREST Do not call a "code blue"   In the event of cardiac or respiratory ARREST Do not perform Intubation, CPR, defibrillation or ACLS   In the event of cardiac or respiratory ARREST Use medication by any route, position, wound care, and other measures to relive pain and suffering. May use oxygen, suction and manual treatment of airway obstruction as needed for comfort.      03/14/20 2217        Code Status History    Date Active Date Inactive Code Status Order ID Comments User Context   02/26/2020 1529 03/04/2020 0009 DNR [id number]  Jonetta Osgood, MD Inpatient   02/20/2020 2230 02/26/2020 1529 Full Code [id number]  Rolla Plate, DO ED   Advance Care Planning Activity    Advance Directive Documentation     Most Recent Value  Type of Advance Directive Living will  Pre-existing out of facility DNR order (yellow form or pink MOST form) --  "MOST" Form in Place? --        IV Access:    Peripheral IV   Procedures and diagnostic studies:   CT CHEST WO CONTRAST  Result Date: 03/19/2020 CLINICAL DATA:  Empyema EXAM:  CT CHEST WITHOUT CONTRAST TECHNIQUE: Multidetector CT imaging of the chest was performed following the standard protocol without IV contrast. COMPARISON:  03/14/2020 FINDINGS: Cardiovascular: Heart is unremarkable without pericardial effusion. Normal caliber of the thoracic aorta. Stable atherosclerosis of the LAD distribution of the coronary vasculature. Mediastinum/Nodes: No pathologic adenopathy. Thyroid, esophagus, and trachea are grossly normal. Lungs/Pleura: Pigtail drainage catheter is seen within the right posterior costophrenic angle. There has been near complete evacuation of the complex right pleural effusion since prior study, less than 100 cc remaining. Small amount of gas within the right pleural space  likely sequela of drain placement. Trace free-flowing left pleural effusion, volume estimated less than 100 cc. Patchy bilateral areas of lung consolidation are again noted, consistent with multifocal pneumonia. Bilateral bronchiectasis again noted. Central airways are patent. Upper Abdomen: Limited imaging through the upper abdomen demonstrates no acute abnormalities. Musculoskeletal: No acute or destructive bony lesions. Reconstructed images demonstrate no additional findings. IMPRESSION: 1. Near complete evacuation of the complex right pleural effusion after pigtail drainage catheter placement. Less than 100 cc of fluid remaining. 2. Patchy bilateral areas of airspace disease consistent with multifocal pneumonia. 3. Trace left pleural effusion. 4. Bilateral bronchiectasis. Electronically Signed   By: Randa Ngo M.D.   On: 03/19/2020 21:57   DG Chest Port 1 View  Result Date: 03/21/2020 CLINICAL DATA:  Empyema. EXAM: PORTABLE CHEST 1 VIEW COMPARISON:  March 19, 2020. FINDINGS: Stable cardiomediastinal silhouette. No pneumothorax is noted. Stable position of right basilar pigtail drainage catheter. Stable bilateral lung opacities are noted concerning for pneumonia. Bony thorax is unremarkable. Stable small right pleural effusion is noted. IMPRESSION: Stable position of right basilar pigtail drainage catheter. Stable bilateral lung opacities are noted concerning for pneumonia. Stable small right pleural effusion. Electronically Signed   By: Marijo Conception M.D.   On: 03/21/2020 08:10     Medical Consultants:    None.  Anti-Infectives:   vanc and cefepime  Subjective:    Ryan Turner relates his breathing is improved.  Objective:    Vitals:   03/20/20 2021 03/20/20 2345 03/21/20 0417 03/21/20 0845  BP: 136/84 119/77 121/74 123/79  Pulse: 95 82 87   Resp: 18 17 20    Temp: 98.8 F (37.1 C) 98.6 F (37 C) 98.3 F (36.8 C) 98.4 F (36.9 C)  TempSrc: Oral Oral Oral Oral  SpO2:  98% 97% 97%   Weight:      Height:       SpO2: 97 % O2 Flow Rate (L/min): 3 L/min FiO2 (%): 98 %   Intake/Output Summary (Last 24 hours) at 03/21/2020 0945 Last data filed at 03/21/2020 0800 Gross per 24 hour  Intake 240 ml  Output 3230 ml  Net -2990 ml   Filed Weights   03/14/20 1225 03/15/20 0140  Weight: 111.1 kg 112 kg    Exam: General exam: In no acute distress. Respiratory system: Good air movement and clear to auscultation. Cardiovascular system: S1 & S2 heard, RRR. No JVD. Gastrointestinal system: Abdomen is nondistended, soft and nontender.  Extremities: No pedal edema. Skin: No rashes, lesions or ulcers  Data Reviewed:    Labs: Basic Metabolic Panel: Recent Labs  Lab 03/14/20 1247 03/14/20 1247 03/16/20 0053 03/16/20 0053 03/17/20 0330 03/18/20 0209 03/20/20 0736  NA 133*  --  133*  --  134* 135  --   K 4.2   < > 4.3   < > 4.8 3.9  --   CL 97*  --  96*  --  98 96*  --   CO2 27  --  28  --  29 27  --   GLUCOSE 143*  --  124*  --  112* 152*  --   BUN 11  --  13  --  7* 10  --   CREATININE 0.59*  --  0.84  --  0.68 0.61 0.75  CALCIUM 8.5*  --  8.2*  --  8.4* 8.5*  --    < > = values in this interval not displayed.   GFR Estimated Creatinine Clearance: 111 mL/min (by C-G formula based on SCr of 0.75 mg/dL). Liver Function Tests: Recent Labs  Lab 03/14/20 1247  AST 66*  ALT 84*  ALKPHOS 123  BILITOT 0.7  PROT 6.5  ALBUMIN 2.7*   No results for input(s): LIPASE, AMYLASE in the last 168 hours. No results for input(s): AMMONIA in the last 168 hours. Coagulation profile No results for input(s): INR, PROTIME in the last 168 hours. COVID-19 Labs  No results for input(s): DDIMER, FERRITIN, LDH, CRP in the last 72 hours.  Lab Results  Component Value Date   SARSCOV2NAA NEGATIVE 03/14/2020   SARSCOV2NAA DETECTED (A) 02/14/2020    CBC: Recent Labs  Lab 03/14/20 1247 03/14/20 1247 03/15/20 1035 03/15/20 1035 03/16/20 0053 03/17/20 0330  03/18/20 0209 03/20/20 0736 03/21/20 0206  WBC 12.7*   < > 9.0   < > 9.1 7.3 9.0 8.2 8.2  NEUTROABS 11.4*  --  7.7  --  7.0 5.2 6.4  --   --   HGB 13.9   < > 12.6*   < > 12.7* 12.8* 13.0 13.4 13.1  HCT 42.0   < > 38.1*   < > 38.4* 39.5 39.3 41.0 40.1  MCV 96.3   < > 97.2   < > 96.2 96.1 94.7 97.6 96.6  PLT 225   < > 218   < > 244 247 265 300 239   < > = values in this interval not displayed.   Cardiac Enzymes: No results for input(s): CKTOTAL, CKMB, CKMBINDEX, TROPONINI in the last 168 hours. BNP (last 3 results) No results for input(s): PROBNP in the last 8760 hours. CBG: No results for input(s): GLUCAP in the last 168 hours. D-Dimer: No results for input(s): DDIMER in the last 72 hours. Hgb A1c: No results for input(s): HGBA1C in the last 72 hours. Lipid Profile: No results for input(s): CHOL, HDL, LDLCALC, TRIG, CHOLHDL, LDLDIRECT in the last 72 hours. Thyroid function studies: No results for input(s): TSH, T4TOTAL, T3FREE, THYROIDAB in the last 72 hours.  Invalid input(s): FREET3 Anemia work up: No results for input(s): VITAMINB12, FOLATE, FERRITIN, TIBC, IRON, RETICCTPCT in the last 72 hours. Sepsis Labs: Recent Labs  Lab 03/17/20 0330 03/18/20 0209 03/20/20 0736 03/21/20 0206  WBC 7.3 9.0 8.2 8.2   Microbiology Recent Results (from the past 240 hour(s))  SARS Coronavirus 2 by RT PCR (hospital order, performed in [facility] hospital lab) Nasopharyngeal Nasopharyngeal Swab     Status: None   Collection Time: 03/14/20 12:56 PM   Specimen: Nasopharyngeal Swab  Result Value Ref Range Status   SARS Coronavirus 2 NEGATIVE NEGATIVE Final    Comment: (NOTE) SARS-CoV-2 target nucleic acids are NOT DETECTED.  The SARS-CoV-2 RNA is generally detectable in upper and lower respiratory specimens during the acute phase of infection. The lowest concentration of SARS-CoV-2 viral copies this assay can detect is 250 copies / mL. A negative result does not preclude SARS-CoV-2  infection and should not be used as the sole basis for treatment or other patient management decisions.  A negative result may occur with improper specimen collection / handling, submission of specimen other than nasopharyngeal swab, presence of viral mutation(s) within the areas targeted by this assay, and inadequate number of viral copies (<250 copies / mL). A negative result must be combined with clinical observations, patient history, and epidemiological information.  Fact Sheet for Patients:   StrictlyIdeas.no  Fact Sheet for Healthcare Providers: BankingDealers.co.za  This test is not yet approved or  cleared by the [place] FDA and has been authorized for detection and/or diagnosis of SARS-CoV-2 by FDA under an Emergency Use Authorization (EUA).  This EUA will remain in effect (meaning this test can be used) for the duration of the COVID-19 declaration under Section 564(b)(1) of the Act, 21 U.S.C. section 360bbb-3(b)(1), unless the authorization is terminated or revoked sooner.  Performed at [facility], [address], [place], [place] [address]   Gram stain     Status: None   Collection Time: 03/14/20  2:17 PM   Specimen: Pleura; Body Fluid  Result Value Ref Range Status   Specimen Description PLEURAL  Final   Special Requests NONE  Final   Gram Stain   Final    GRAM POSITIVE COCCI Gram Stain Report Called to,Read Back By and Verified With: CARDWELL,L ON 03/14/20 AT 1640 BY LOY,C WBC PRESENT,BOTH PMN AND MONONUCLEAR CYTOSPIN SMEAR PERFORMED AT [facility] Performed at [facility], [address], [place], [place] [address]    Report Status 03/14/2020 FINAL  Final  Culture, body fluid-bottle     Status: Abnormal   Collection Time: 03/14/20  2:48 PM   Specimen: Pleura  Result Value Ref Range Status   Specimen Description   Final    PLEURAL Performed at [facility], [address] [place], [place] [address]    Special  Requests   Final    10CC Performed at [facility], [address], [place], [place] [address]    Gram Stain   Final    GRAM POSITIVE COCCI IN BOTH AEROBIC AND ANAEROBIC BOTTLES Gram Stain Report Called to,Read Back By and Verified With: M DOSS,RN @0031  03/15/20 MKELLY Performed at [facility], [address], [place], [place] [address]    Culture STAPHYLOCOCCUS AUREUS (A)  Final   Report Status 03/17/2020 FINAL  Final   Organism ID, Bacteria STAPHYLOCOCCUS AUREUS  Final      Susceptibility   Staphylococcus aureus - MIC*    CIPROFLOXACIN <=0.5 SENSITIVE Sensitive     ERYTHROMYCIN <=0.25 SENSITIVE Sensitive     GENTAMICIN <=0.5 SENSITIVE Sensitive     OXACILLIN <=0.25 SENSITIVE Sensitive     TETRACYCLINE <=1 SENSITIVE Sensitive     VANCOMYCIN <=0.5 SENSITIVE Sensitive     TRIMETH/SULFA <=10 SENSITIVE Sensitive     CLINDAMYCIN <=0.25 SENSITIVE Sensitive     RIFAMPIN <=0.5 SENSITIVE Sensitive     Inducible Clindamycin NEGATIVE Sensitive     * STAPHYLOCOCCUS AUREUS  MRSA PCR Screening     Status: None   Collection Time: 03/15/20 10:59 AM   Specimen: Nasopharyngeal  Result Value Ref Range Status   MRSA by PCR NEGATIVE NEGATIVE Final    Comment:        The GeneXpert MRSA Assay (FDA approved for NASAL specimens only), is one component of a comprehensive MRSA colonization surveillance program. It is not intended to diagnose MRSA infection nor to guide or monitor treatment for MRSA infections.  Performed at [facility], [place] [address] [place], [place] [address]   Aerobic/Anaerobic Culture (surgical/deep wound)     Status: None   Collection Time: 03/15/20  4:15 PM   Specimen: Pleural Fluid  Result Value Ref Range Status   Specimen Description FLUID RIGHT PLEURAL  Final   Special Requests NONE  Final   Gram Stain   Final    RARE WBC PRESENT, PREDOMINANTLY PMN RARE GRAM POSITIVE COCCI    Culture   Final    MODERATE STAPHYLOCOCCUS AUREUS NO ANAEROBES  ISOLATED Performed at [facility], [place] [address] [place],  [address]    Report Status 03/20/2020 FINAL  Final   Organism ID, Bacteria STAPHYLOCOCCUS AUREUS  Final      Susceptibility   Staphylococcus aureus - MIC*    CIPROFLOXACIN <=0.5 SENSITIVE Sensitive     ERYTHROMYCIN <=0.25 SENSITIVE Sensitive     GENTAMICIN <=0.5 SENSITIVE Sensitive     OXACILLIN <=0.25 SENSITIVE Sensitive     TETRACYCLINE <=1 SENSITIVE Sensitive     VANCOMYCIN 1 SENSITIVE Sensitive     TRIMETH/SULFA <=10 SENSITIVE Sensitive     CLINDAMYCIN <=0.25 SENSITIVE Sensitive     RIFAMPIN <=0.5 SENSITIVE Sensitive     Inducible Clindamycin NEGATIVE Sensitive     * MODERATE STAPHYLOCOCCUS AUREUS     Medications:   . melatonin  3 mg Oral QHS  . sodium chloride flush  10 mL Intracatheter Q8H   Continuous Infusions: . sodium chloride 250 mL (03/20/20 2140)  .  ceFAZolin (ANCEF) IV 2 g (03/21/20 0548)  . heparin 2,050 Units/hr (03/21/20 0820)      LOS: 7 days   Charlynne Cousins  [facility]  03/21/2020, 9:45 AM

## 2020-03-21 NOTE — Progress Notes (Signed)
NAME:  Ryan Turner, MRN:  [id number], DOB:  08/30/1950, LOS: 7 ADMISSION DATE:  03/14/2020, CONSULTATION DATE:  03/14/20  REFERRING MD:  EDP, CHIEF COMPLAINT:  Sob/L hemothorax   Brief History   69 yo male cigar smoker found to have COVID 19 pneumonia on 02/14/20 and was admitted to hospital from 02/20/20 to 03/03/20.  He was treated with steroids, remdesivir, and tocilizumab.  Course complicated by Lt leg DVT.  He was discharged home on 2 to 4 liters oxygen.  Had progressive dyspnea and found to have Rt pleural effusion.  Admitted to hospital on 03/14/20 and found to have MSSA Rt empyema.  Past Medical History  HTN, IBS, OA  Significant Hospital Events   8/24 admit, thoracentesis 8/25 Pigtail by IR 8/26 tPA/DNAse in chest tube 8/27 tPA/DNAse in chest tube 8/31 discontinue chest tube  Consults:  IR Cardiothoracic surgery  Procedures:    Significant Diagnostic Tests:   Rt thoracentesis 8/24 >> 400 ml dark/bloody fluid, glucose < 20, protein 3.9, LDH 277, WBC > 10,000 (84% neutrophils), cytology negative  CT angio chest 8/24 >> patchy peripheral infiltrates, consolidation in RLL and large Rt pleural effusion  CT chest 8/29 >> near complete evacuation of complex Rt pleural effusion  Micro Data:  COVID 8/24 >> negative Rt pleural fluid 8/24 >> MSSA MRSA PCR 8/25 >> negative Rt pleural fluid 8/25 >> MSSA  Antimicrobials:  Cefepime 8/24 >>> 8/26 Vancomycin  8/24 >>> 8/27 Ancef 8/29 >>   Interim history/subjective:  Minimum outpt from chest tube.  CXR stable.  Denies chest pain.  Objective   Blood pressure 121/74, pulse 87, temperature 98.3 F (36.8 C), temperature source Oral, resp. rate 20, height 6\' 1"  (1.854 m), weight 112 kg, SpO2 97 %.        Intake/Output Summary (Last 24 hours) at 03/21/2020 0841 Last data filed at 03/21/2020 0800 Gross per 24 hour  Intake 480 ml  Output 3380 ml  Net -2900 ml   Filed Weights   03/14/20 1225 03/15/20 0140  Weight: 111.1 kg  112 kg      Physical exam:  General - alert Eyes - pupils reactive ENT - no sinus tenderness, no stridor Cardiac - regular rate/rhythm, no murmur Chest - decreased breath sounds Rt base Abdomen - soft, non tender, + bowel sounds Extremities - no cyanosis, clubbing, or edema Skin - no rashes Neuro - normal strength, moves extremities, follows commands Psych - normal mood and behavior   Resolved Hospital Problem list     Assessment & Plan:   Acute on chronic hypoxic respiratory failure MSSA HCAP with Rt empyema after recent COVID 19 pneumonia. - can d/c chest tube 8/31 - f/u CXR 9/01 - transitioning to oral Abx per primary team >> would complete 3 weeks total of ABx - goal SpO2 > 90%; has home oxygen set up from previous admission - will need outpt pulmonary follow up in [place] leg DVT. - should be okay to transition to eliquis since it is unlikely he will need additional pulmonary procedures at this time  Prednisone therapy. - defer tapering to primary team  Best practice:  Diet: heart healthy DVT prophylaxis: Heparin  GI prophylaxis: not indicated Mobility: as tolerated Code Status: DNR  Labs    CMP Latest Ref Rng & Units 03/20/2020 03/18/2020 03/17/2020  Glucose 70 - 99 mg/dL - 152(H) 112(H)  BUN 8 - 23 mg/dL - 10 7(L)  Creatinine 0.61 - 1.24 mg/dL 0.75 0.61 0.68  Sodium  135 - 145 mmol/L - 135 134(L)  Potassium 3.5 - 5.1 mmol/L - 3.9 4.8  Chloride 98 - 111 mmol/L - 96(L) 98  CO2 22 - 32 mmol/L - 27 29  Calcium 8.9 - 10.3 mg/dL - 8.5(L) 8.4(L)  Total Protein 6.5 - 8.1 g/dL - - -  Total Bilirubin 0.3 - 1.2 mg/dL - - -  Alkaline Phos 38 - 126 U/L - - -  AST 15 - 41 U/L - - -  ALT 0 - 44 U/L - - -    CBC Latest Ref Rng & Units 03/21/2020 03/20/2020 03/18/2020  WBC 4.0 - 10.5 K/uL 8.2 8.2 9.0  Hemoglobin 13.0 - 17.0 g/dL 13.1 13.4 13.0  Hematocrit 39 - 52 % 40.1 41.0 39.3  Platelets 150 - 400 K/uL 239 300 265    Signature:  Chesley Mires, MD Benjamin Perez Pager - [phone] 03/21/2020, 8:41 AM

## 2020-03-21 NOTE — Progress Notes (Signed)
Right chest pigtail catheter removed per order. Site clean and dry. Gauze dressing applied.

## 2020-03-21 NOTE — Progress Notes (Signed)
[place] for IV Heparin Indication: recent DVT  Allergies  Allergen Reactions  . Morphine     Other reaction(s): VOMITING    Patient Measurements: Height: 6\' 1"  (185.4 cm) Weight: 112 kg (246 lb 14.6 oz) IBW/kg (Calculated) : 79.9 Heparin Dosing Weight: 105 kg  Vital Signs: Temp: 98.3 F (36.8 C) (08/31 0417) Temp Source: Oral (08/31 0417) BP: 121/74 (08/31 0417) Pulse Rate: 87 (08/31 0417)  Labs: Recent Labs    03/19/20 0350 03/20/20 0736 03/21/20 0206  HGB  --  13.4 13.1  HCT  --  41.0 40.1  PLT  --  300 239  HEPARINUNFRC 0.40 0.22* 0.27*  CREATININE  --  0.75  --     Estimated Creatinine Clearance: 111 mL/min (by C-G formula based on SCr of 0.75 mg/dL).  Assessment: 69 years of age male on Apixaban prior to admission (last dose 03/14/20 at 0830) for recent DVT now transitioned to IV Heparin for possible procedures  Heparin level low this morning at 0.27  Goal of Therapy:  Heparin level 0.3-0.7 units/ml Monitor platelets by anticoagulation protocol: Yes   Plan:  Increase heparin to 2050 units / hr Follow daily labs - heparin level, CBC  Back to apixaban soon?  Thank you Anette Guarneri, PharmD 03/21/2020 8:16 AM  Please see amion for complete clinical pharmacist phone list

## 2020-03-22 ENCOUNTER — Inpatient Hospital Stay (HOSPITAL_COMMUNITY): Payer: BC Managed Care – PPO

## 2020-03-22 ENCOUNTER — Telehealth: Payer: Self-pay

## 2020-03-22 LAB — CBC
HCT: 38.4 % — ABNORMAL LOW (ref 39.0–52.0)
Hemoglobin: 12.5 g/dL — ABNORMAL LOW (ref 13.0–17.0)
MCH: 31.7 pg (ref 26.0–34.0)
MCHC: 32.6 g/dL (ref 30.0–36.0)
MCV: 97.5 fL (ref 80.0–100.0)
Platelets: 295 10*3/uL (ref 150–400)
RBC: 3.94 MIL/uL — ABNORMAL LOW (ref 4.22–5.81)
RDW: 13.7 % (ref 11.5–15.5)
WBC: 7.6 10*3/uL (ref 4.0–10.5)
nRBC: 0 % (ref 0.0–0.2)

## 2020-03-22 LAB — CREATININE, SERUM
Creatinine, Ser: 0.77 mg/dL (ref 0.61–1.24)
GFR calc Af Amer: >60 mL/min (ref >60)
GFR calc non Af Amer: >60 mL/min (ref >60)

## 2020-03-22 LAB — HEPARIN LEVEL (UNFRACTIONATED): Heparin Unfractionated: 0.36 IU/mL (ref 0.30–0.70)

## 2020-03-22 MED ORDER — APIXABAN 5 MG PO TABS: 5.0000 mg | ORAL_TABLET | Freq: Two times a day (BID) | ORAL | 0 refills | Status: DC

## 2020-03-22 MED ORDER — SACCHAROMYCES BOULARDII 250 MG PO CAPS: 250.0000 mg | ORAL_CAPSULE | Freq: Two times a day (BID) | ORAL | 0 refills | Status: AC

## 2020-03-22 MED ORDER — APIXABAN 5 MG PO TABS
5.0000 mg | ORAL_TABLET | Freq: Two times a day (BID) | ORAL | Status: DC
  Administered 2020-03-22: 5 mg via ORAL
  Filled 2020-03-22: qty 1

## 2020-03-22 MED ORDER — CEPHALEXIN 500 MG PO CAPS: 500.0000 mg | ORAL_CAPSULE | Freq: Three times a day (TID) | ORAL | 0 refills | Status: AC

## 2020-03-22 NOTE — Consult Note (Addendum)
[facility] Nurse Consult Note: Reason for Consult:Stage 3 sacral pressure injury.  Has recently been hospitalized for COVID.   Wound type:pressure injury and moisture Pressure Injury POA: Yes Measurement: 2 cm x 2.5 cm x 0.2 cm  Wound UVO:ZDGU and moist Drainage (amount, consistency, odor) minimal serosanguinous  No odor.  Periwound:intact frequently moist due to perspiration Dressing procedure/placement/frequency: Cleanse sacral wound with soap and water and pat dry.  Apply barrier cream and fold a 4x4 gauze.  Educated patient to clean rectal skin fold twice daily as perspiration is contributing to skin breakdown.  Patient verbalizes understanding.  Will not follow at this time.  Please re-consult if needed.  Domenic Moras MSN, RN, FNP-BC CWON Wound, Ostomy, Continence Nurse Pager [phone]

## 2020-03-22 NOTE — Telephone Encounter (Signed)
[facility] and spoke with Margreta Journey to let her know that patient was scheduled for a sooner appointment on 8/27 but it was canceled due to him being in hospital. He is now scheduled to be seen in our office on 9/9 and then again on 10/11. Nothing further needed at this time.

## 2020-03-22 NOTE — Progress Notes (Signed)
Physical Therapy Treatment Patient Details Name: Ryan Turner MRN: [id number] DOB: May 10, 1951 Today's Date: 03/22/2020    History of Present Illness Ryan Turner is an 69 y.o. male past medical history significant for recent COVID-19 pneumonia, essential hypertension with a prolonged hospitalization recent discharge on 03/03/2020 for Covid pneumonia complicated by DVT which he completed treatment and was discharged home on steroid taper and Eliquis and 3 L of oxygen.  Main to the ED with increased difficulty breathing and with sats dropping into the 68%, and was diagnosed with empyema in the right pleural space. now with chest tube    PT Comments    Pt eager to get up and wanting to try without RW.  Emphasis on gait training, balance challenge and realizing symptoms to stop and regroup.   Follow Up Recommendations  Home health PT;Supervision - Intermittent     Equipment Recommendations  None recommended by PT    Recommendations for Other Services       Precautions / Restrictions Precautions Precautions: Fall Precaution Comments: Watch O2 sats with amb    Mobility  Bed Mobility               General bed mobility comments: OOB on arrival  Transfers Overall transfer level: Needs assistance Equipment used: None Transfers: Sit to/from Stand Sit to Stand: Modified independent (Device/Increase time)         General transfer comment: no use of hands appropriately  Ambulation/Gait Ambulation/Gait assistance: Supervision Gait Distance (Feet): 500 Feet Assistive device: None Gait Pattern/deviations: Step-through pattern;Decreased stride length   Gait velocity interpretation: >2.62 ft/sec, indicative of community ambulatory General Gait Details: initially with mild unsteadiness which improved with distance.  Frequent stops to rest and recover from dropped sats.  Sats maintained at 89-91% except 2 occasions where pt was asked to give a burst of speed.  Sats at this  point dropped to 83% with slow return to 88% on 3 to 4 L.  EHR in the low 100's   Stairs Stairs: Yes Stairs assistance: Modified independent (Device/Increase time) Stair Management: One rail Right;Alternating pattern;Forwards Number of Stairs: 2 General stair comments: safe with the rail   Wheelchair Mobility    Modified Rankin (Stroke Patients Only)       Balance Overall balance assessment: Needs assistance   Sitting balance-Leahy Scale: Good       Standing balance-Leahy Scale: Good                              Cognition Arousal/Alertness: Awake/alert Behavior During Therapy: WFL for tasks assessed/performed Overall Cognitive Status: Within Functional Limits for tasks assessed                                 General Comments: motivated; needs cues to self monitor for symptoms      Exercises      General Comments        Pertinent Vitals/Pain Pain Assessment: Faces Faces Pain Scale: No hurt Pain Intervention(s): Monitored during session    Home Living                      Prior Function            PT Goals (current goals can now be found in the care plan section) Acute Rehab PT Goals Patient Stated Goal: recover and go home PT Goal Formulation:  With patient Time For Goal Achievement: 04/02/20 Potential to Achieve Goals: Good Progress towards PT goals: Progressing toward goals    Frequency    Min 3X/week      PT Plan Current plan remains appropriate    Co-evaluation              AM-PAC PT "6 Clicks" Mobility   Outcome Measure  Help needed turning from your back to your side while in a flat bed without using bedrails?: None Help needed moving from lying on your back to sitting on the side of a flat bed without using bedrails?: None Help needed moving to and from a bed to a chair (including a wheelchair)?: None Help needed standing up from a chair using your arms (e.g., wheelchair or bedside chair)?:  None   Help needed climbing 3-5 steps with a railing? : None 6 Click Score: 20    End of Session Equipment Utilized During Treatment: Oxygen Activity Tolerance: Patient tolerated treatment well Patient left: in chair;with call bell/phone within reach Nurse Communication: Mobility status PT Visit Diagnosis: Other abnormalities of gait and mobility (R26.89)     Time: 1410-3013 PT Time Calculation (min) (ACUTE ONLY): 24 min  Charges:  $Gait Training: 8-22 mins $Therapeutic Activity: 8-22 mins                     03/22/2020  Ginger Carne., PT Acute Rehabilitation Services [phone]  (pager) [phone]  (office)   Tessie Fass [name] 03/22/2020, 12:27 PM

## 2020-03-22 NOTE — Progress Notes (Signed)
Discharge instructions provided to patient. All medications, follow up appointments, and discharge instructions provided. IV out. Monitor off CCMD notified. Discharging to home with wife.  Adylin Hankey R Dametra Whetsel, RN  

## 2020-03-22 NOTE — Progress Notes (Signed)
[facility] for IV Heparin Indication: recent DVT  Allergies  Allergen Reactions  . Morphine     Other reaction(s): VOMITING    Patient Measurements: Height: 6\' 1"  (185.4 cm) Weight: 112 kg (246 lb 14.6 oz) IBW/kg (Calculated) : 79.9 Heparin Dosing Weight: 105 kg  Vital Signs: Temp: 98.9 F (37.2 C) (09/01 0804) Temp Source: Oral (09/01 0804) BP: 120/68 (09/01 0804) Pulse Rate: 87 (09/01 0804)  Labs: Recent Labs    03/20/20 0736 03/20/20 0736 03/21/20 0206 03/22/20 0834  HGB 13.4   < > 13.1 12.5*  HCT 41.0  --  40.1 38.4*  PLT 300  --  239 295  HEPARINUNFRC 0.22*  --  0.27* 0.36  CREATININE 0.75  --   --  0.77   < > = values in this interval not displayed.    Estimated Creatinine Clearance: 111 mL/min (by C-G formula based on SCr of 0.77 mg/dL).  Assessment: 69 years of age male on Apixaban prior to admission (last dose 03/14/20 at 0830) for recent DVT now transitioned to IV Heparin for possible procedures  Heparin level therapeutic  Goal of Therapy:  Heparin level 0.3-0.7 units/ml Monitor platelets by anticoagulation protocol: Yes   Plan:  Continue heparin at 2050 units / hr Follow daily labs - heparin level, CBC  Back to apixaban soon?  Thank you Anette Guarneri, PharmD 03/22/2020 10:00 AM  Please see amion for complete clinical pharmacist phone list

## 2020-03-22 NOTE — Telephone Encounter (Signed)
Mandy from [facility] called Ryan Turner has appt on 03/30/20

## 2020-03-22 NOTE — Progress Notes (Signed)
NAME:  Ryan Turner, MRN:  [id number], DOB:  04/16/1951, LOS: 8 ADMISSION DATE:  03/14/2020, CONSULTATION DATE:  03/14/20  REFERRING MD:  EDP, CHIEF COMPLAINT:  Sob/L hemothorax   Brief History   69 yo male cigar smoker found to have COVID 19 pneumonia on 02/14/20 and was admitted to hospital from 02/20/20 to 03/03/20.  He was treated with steroids, remdesivir, and tocilizumab.  Course complicated by Lt leg DVT.  He was discharged home on 2 to 4 liters oxygen.  Had progressive dyspnea and found to have Rt pleural effusion.  Admitted to hospital on 03/14/20 and found to have MSSA Rt empyema.  Past Medical History  HTN, IBS, OA  Significant Hospital Events   8/24 admit, thoracentesis 8/25 Pigtail by IR 8/26 tPA/DNAse in chest tube 8/27 tPA/DNAse in chest tube 8/31 discontinue chest tube  Consults:  IR Cardiothoracic surgery  Procedures:    Significant Diagnostic Tests:   Rt thoracentesis 8/24 >> 400 ml dark/bloody fluid, glucose < 20, protein 3.9, LDH 277, WBC > 10,000 (84% neutrophils), cytology negative  CT angio chest 8/24 >> patchy peripheral infiltrates, consolidation in RLL and large Rt pleural effusion  CT chest 8/29 >> near complete evacuation of complex Rt pleural effusion  Micro Data:  COVID 8/24 >> negative Rt pleural fluid 8/24 >> MSSA MRSA PCR 8/25 >> negative Rt pleural fluid 8/25 >> MSSA  Antimicrobials:  Cefepime 8/24 >>> 8/26 Vancomycin  8/24 >>> 8/27 Ancef 8/29 >> 8/31 Keflex 8/31 >>   Interim history/subjective:  Denies chest pain.  Still gets winded when walking in hall, but better than before.  Not having cough.  Objective   Blood pressure 120/68, pulse 87, temperature 98.9 F (37.2 C), temperature source Oral, resp. rate 18, height 6\' 1"  (1.854 m), weight 112 kg, SpO2 98 %.        Intake/Output Summary (Last 24 hours) at 03/22/2020 0901 Last data filed at 03/22/2020 0726 Gross per 24 hour  Intake 1359.2 ml  Output 1850 ml  Net -490.8 ml    Filed Weights   03/14/20 1225 03/15/20 0140  Weight: 111.1 kg 112 kg      Physical exam:  General - alert Eyes - pupils reactive ENT - no sinus tenderness, no stridor Cardiac - regular rate/rhythm, no murmur Chest - decreased breaths sounds at bases Rt > Lt Abdomen - soft, non tender, + bowel sounds Extremities - no cyanosis, clubbing, or edema Skin - no rashes Neuro - normal strength, moves extremities, follows commands Psych - normal mood and behavior   Assessment & Plan:   Acute on chronic hypoxic respiratory failure MSSA HCAP with Rt empyema after recent COVID 19 pneumonia. - chest tube d/c'ed 8/31 - day 9 of 21 days of ABx, currently on keflex - will need f/u CXR as outpt  - has home oxygen set up and portable pulse oximeter already - goal SpO2 > 90%  Lt leg DVT. - transition back to eliquis per primary team  I have scheduled him for outpt pulmonary follow up with Derl Barrow on 03/30/20 at 53 in [place] office.  He will then follow up with Dr. Melvyn Novas on 05/01/20 in [place] office.  He is stable for d/c home from pulmonary standpoint.  D/w Dr. Pietro Cassis.  Best practice:  Diet: heart healthy DVT prophylaxis: Eliquis GI prophylaxis: not indicated Mobility: as tolerated Code Status: DNR  Labs    CMP Latest Ref Rng & Units 03/20/2020 03/18/2020 03/17/2020  Glucose 70 - 99 mg/dL -  152(H) 112(H)  BUN 8 - 23 mg/dL - 10 7(L)  Creatinine 0.61 - 1.24 mg/dL 0.75 0.61 0.68  Sodium 135 - 145 mmol/L - 135 134(L)  Potassium 3.5 - 5.1 mmol/L - 3.9 4.8  Chloride 98 - 111 mmol/L - 96(L) 98  CO2 22 - 32 mmol/L - 27 29  Calcium 8.9 - 10.3 mg/dL - 8.5(L) 8.4(L)  Total Protein 6.5 - 8.1 g/dL - - -  Total Bilirubin 0.3 - 1.2 mg/dL - - -  Alkaline Phos 38 - 126 U/L - - -  AST 15 - 41 U/L - - -  ALT 0 - 44 U/L - - -    CBC Latest Ref Rng & Units 03/21/2020 03/20/2020 03/18/2020  WBC 4.0 - 10.5 K/uL 8.2 8.2 9.0  Hemoglobin 13.0 - 17.0 g/dL 13.1 13.4 13.0  Hematocrit 39 - 52  % 40.1 41.0 39.3  Platelets 150 - 400 K/uL 239 300 265    Signature:  Chesley Mires, MD [place] Pager - [phone] 03/22/2020, 9:01 AM

## 2020-03-22 NOTE — Discharge Summary (Signed)
Physician Discharge Summary  Ryan Turner WJX:[id number] DOB: [birth year] DOA: 03/14/2020  PCP: [name], MD  Admit date: 03/14/2020 Discharge date: 03/22/2020  Admitted From: home Discharge disposition: home   Code Status: DNR  Diet Recommendation: regular diet  Discharge Diagnosis:   Principal Problem:   Empyema of right pleural space ([place]) Active Problems:   Hypertension   Pneumonia due to COVID-19 virus   DVT (deep venous thrombosis) ([place])   Pressure injury of skin  History of Present Illness / Brief narrative:  Ryan Turner is an 69 y.o. male  with recent history of Covid pneumonia, diagnosed 02/14/2020, hospitalized 02/20/2020 to 03/03/2020.  Discharge on supplemental oxygen on steroids.  His hospital course was also complicated by acute DVT of his left lower extremity and was started on Eliquis.  Post discharge, he was gradually improving but started having worsening shortness of breath and right-sided pleuritic pain.Ryan Turner He was seen by his pulmonologist as an outpatient on 8/24 and subsequently sent to the ED. Chest x-ray showed right pleural effusion. He underwent a right thoracentesis in IR 03/14/2020 by Dr. Thornton Papas yielding 400 mL.  He was transferred and admitted to [facility] for further management. See below for details.  Subjective:  Seen and examined this am.  Not in distress. Chest tube out yesterday.  Wants to go home.  Hospital Course:  Loculated empyema of right pleural space -Chest x-ray showed right pleural effusion. He underwent a right thoracentesis in IR 03/14/2020 by Dr. Thornton Papas yielding 400 mL.  -Subsequent CTA chest revealed large right pleural effusion despite recent thoracentesis.   -Cardiothoracic surgery for the right chest tube and administered TPA. Patient clinically improved. -Chest tube removed on 8/31. Chest x-ray shows interval improvement. -Cultures grew MSSA continue antibiotics for for 2 weeks as an outpatient. -on Keflex 500 mg q8 hrs.  Plan for 2 more weeks to complete a total of 3 weeks.   Acute on chronic respiratory failure with hypoxia  -secondary to COVID.  -On 2lpm O2 at rest and 4lpm on ambulation.   Recent left lower extremity DVT: -Eliquis to resume at home.  History of a recent pneumonia due to COVID-19 virus Discharged on 3 to 6 L of oxygen, continue steroid taper to finish as planned.  Pressure injury - buttocks stage 2.  -seen by wound care as below. Wound care: Pressure Injury 03/15/20 Buttocks Mid Stage 2 -  Partial thickness loss of dermis presenting as a shallow open injury with a red, pink wound bed without slough. (Active)  Date First Assessed/Time First Assessed: 03/15/20 1825   Location: Buttocks  Location Orientation: Mid  Staging: Stage 2 -  Partial thickness loss of dermis presenting as a shallow open injury with a red, pink wound bed without slough.  Present on Adm...    Assessments 03/15/2020  6:15 PM 03/22/2020  7:45 AM  Dressing Type Barrier Film (skin prep) Foam - Lift dressing to assess site every shift  Dressing Clean;Dry;Intact Clean;Dry;Intact  Dressing Change Frequency PRN PRN  Margins Unattached edges (unapproximated) Unattached edges (unapproximated)  Drainage Amount None None  Treatment Cleansed --     No Linked orders to display    Discharge Exam:   Vitals:   03/21/20 2336 03/22/20 0407 03/22/20 0408 03/22/20 0804  BP: 132/76 119/71 119/71 120/68  Pulse: 90  83 87  Resp: 17  19 18   Temp: 98.3 F (36.8 C)  98.8 F (37.1 C) 98.9 F (37.2 C)  TempSrc: Oral  Oral Oral  SpO2:  98%  97% 98%  Weight:      Height:        Body mass index is 32.58 kg/m.  General exam: Appears calm and comfortable. Not in physical distress Skin: No rashes, lesions or ulcers. HEENT: Atraumatic, normocephalic, supple neck, no obvious bleeding Lungs: clear to ausculation on left side. Mild crackles on right CVS: regular rate and rhythm. No murmur.   GI/Abd soft, non tender, non distended,  BS+  CNS: AAOx3  Psychiatry: mood appropriate Extremities: no pedal edema, no calf tenderness.  Follow ups:   Discharge Instructions    Diet general   Complete by: As directed    Increase activity slowly   Complete by: As directed    Leave dressing on - Keep it clean, dry, and intact until clinic visit   Complete by: As directed       Follow-up Information    Martyn Ehrich, NP Follow up on 03/30/2020.   Specialty: Pulmonary Disease Why: Follow up with lung doctors at 10:30 AM.  You will have a chest xray during your appointment. Contact information: [address] [place] [address] [phone]        [name], MD Follow up.   Specialty: Family Medicine Contact information: [address] [place] [address] [phone]               Recommendations for Outpatient Follow-Up:   1. F/u with PCP as an outpatient.   Discharge Instructions:  Follow with Primary MD [name], MD in 7 days   Get CBC/BMP checked in next visit within 1 week by PCP or SNF MD ( we routinely change or add medications that can affect your baseline labs and fluid status, therefore we recommend that you get the mentioned basic workup next visit with your PCP, your PCP may decide not to get them or add new tests based on their clinical decision)  On your next visit with your PCP, please Get Medicines reviewed and adjusted.  Please request your PCP  to go over all Hospital Tests and Procedure/Radiological results at the follow up, please get all Hospital records sent to your Prim MD by signing hospital release before you go home.  Activity: As tolerated with Full fall precautions use walker/cane & assistance as needed  For Heart failure patients - Check your Weight same time everyday, if you gain over 2 pounds, or you develop in leg swelling, experience more shortness of breath or chest pain, call your Primary MD immediately. Follow Cardiac Low Salt  Diet and 1.5 lit/day fluid restriction.  If you have smoked or chewed Tobacco in the last 2 yrs please stop smoking, stop any regular Alcohol  and or any Recreational drug use.  If you experience worsening of your admission symptoms, develop shortness of breath, life threatening emergency, suicidal or homicidal thoughts you must seek medical attention immediately by calling 911 or calling your MD immediately  if symptoms less severe.  You Must read complete instructions/literature along with all the possible adverse reactions/side effects for all the Medicines you take and that have been prescribed to you. Take any new Medicines after you have completely understood and accpet all the possible adverse reactions/side effects.   Do not drive, operate heavy machinery, perform activities at heights, swimming or participation in water activities or provide baby sitting services if your were admitted for syncope or siezures until you have seen by Primary MD or a Neurologist  and advised to do so again.  Do not drive when taking Pain medications.  Do not take more than prescribed Pain, Sleep and Anxiety Medications  Wear Seat belts while driving.   Please note You were cared for by a hospitalist during your hospital stay. If you have any questions about your discharge medications or the care you received while you were in the hospital after you are discharged, you can call the unit and asked to speak with the hospitalist on call if the hospitalist that took care of you is not available. Once you are discharged, your primary care physician will handle any further medical issues. Please note that NO REFILLS for any discharge medications will be authorized once you are discharged, as it is imperative that you return to your primary care physician (or establish a relationship with a primary care physician if you do not have one) for your aftercare needs so that they can reassess your need for medications and  monitor your lab values.    Allergies as of 03/22/2020      Reactions   Morphine    Other reaction(s): VOMITING      Medication List    TAKE these medications   acetaminophen 325 MG tablet Commonly known as: TYLENOL Take 2 tablets (650 mg total) by mouth every 6 (six) hours as needed for mild pain or headache (fever >/= 101).   apixaban 5 MG Tabs tablet Commonly known as: ELIQUIS Take 1 tablet (5 mg total) by mouth 2 (two) times daily. Start taking on: April 08, 2020 What changed: These instructions start on April 08, 2020. If you are unsure what to do until then, ask your doctor or other care provider.   celecoxib 200 MG capsule Commonly known as: CELEBREX TAKE 1 CAPSULE (200 MG TOTAL) BY MOUTH AS NEEDED. What changed:   when to take this  reasons to take this   cephALEXin 500 MG capsule Commonly known as: KEFLEX Take 1 capsule (500 mg total) by mouth every 8 (eight) hours for 14 days.   docusate sodium 100 MG capsule Commonly known as: COLACE Take 200 mg by mouth daily as needed for mild constipation or moderate constipation.   Fish Oil 1200 MG Caps Take 2 capsules by mouth daily.   Glucosamine Chondroitin Complx Caps Take 2 capsules by mouth daily. 1800 mg   OCUVITE PRESERVISION PO Take 2 tablets by mouth daily.   predniSONE 20 MG tablet Commonly known as: DELTASONE Take 1 tablet (20 mg total) by mouth daily with breakfast for 7 days.   saccharomyces boulardii 250 MG capsule Commonly known as: FLORASTOR Take 1 capsule (250 mg total) by mouth 2 (two) times daily for 14 days.   traMADol 50 MG tablet Commonly known as: ULTRAM Take 1 tablet (50 mg total) by mouth every 8 (eight) hours as needed for up to 10 days.   zolpidem 10 MG tablet Commonly known as: AMBIEN Take 0.5-1 tablets (5-10 mg total) by mouth at bedtime as needed. for sleep            Discharge Care Instructions  (From admission, onward)         Start     Ordered   03/22/20  0000  Leave dressing on - Keep it clean, dry, and intact until clinic visit        03/22/20 1107          Time coordinating discharge: 35 minutes  The results of significant diagnostics from this hospitalization (  including imaging, microbiology, ancillary and laboratory) are listed below for reference.    Procedures and Diagnostic Studies:   DG Chest 1 View  Result Date: 03/14/2020 CLINICAL DATA:  RIGHT pneumothorax post thoracentesis EXAM: CHEST  1 VIEW COMPARISON:  Extra tori AP chest radiograph 1451 hours compared to earlier study of 1254 hours FINDINGS: Enlargement of cardiac silhouette. BILATERAL pulmonary infiltrates consistent with history of recent COVID pneumonia. Again identified moderate to large RIGHT pleural effusion, accentuated by expiratory technique. Significant RIGHT basilar atelectasis. No pneumothorax following thoracentesis. IMPRESSION: No pneumothorax following RIGHT thoracentesis. Persistent RIGHT pleural effusion/hemothorax, RIGHT basilar atelectasis, and BILATERAL infiltrates. Electronically Signed   By: Lavonia Dana M.D.   On: 03/14/2020 15:28   CT Angio Chest PE W and/or Wo Contrast  Result Date: 03/14/2020 CLINICAL DATA:  Shortness of breath, COVID-19 positivity, recent right thoracentesis EXAM: CT ANGIOGRAPHY CHEST WITH CONTRAST TECHNIQUE: Multidetector CT imaging of the chest was performed using the standard protocol during bolus administration of intravenous contrast. Multiplanar CT image reconstructions and MIPs were obtained to evaluate the vascular anatomy. CONTRAST:  163mL OMNIPAQUE IOHEXOL 350 MG/ML SOLN COMPARISON:  Chest x-ray from earlier in the same day. FINDINGS: Cardiovascular: Thoracic aorta demonstrates mild atherosclerotic calcification without aneurysmal dilatation or dissection. No significant cardiac enlargement is seen. Scattered coronary calcifications are noted. The pulmonary artery shows a normal branching pattern. Motion artifact limits  evaluation of the lower lobe branches. No definitive intraluminal filling defect to suggest pulmonary embolism is noted. Mediastinum/Nodes: Thoracic inlet is within normal limits. Scattered small hilar and mediastinal lymph nodes are noted likely reactive in nature. The esophagus as visualized is within normal limits. Lungs/Pleura: Patchy peripheral infiltrates are identified within both lungs consistent with the given clinical history of COVID-19 positivity. Additionally there is consolidation in the right lower lobe likely of a more compressive affect given the large right-sided pleural effusion. This persists despite recent thoracentesis. No pneumothorax is noted. Upper Abdomen: Visualized upper abdomen shows no acute abnormality. Musculoskeletal: No chest wall abnormality. No acute or significant osseous findings. Review of the MIP images confirms the above findings. IMPRESSION: Patchy bilateral airspace opacities consistent with the given clinical history of COVID-19 positivity. Large right-sided pleural effusion despite recent thoracentesis. This causes some compressive consolidation particularly in the right lower lobe. No pneumothorax is noted. No evidence of pulmonary emboli. Aortic Atherosclerosis (ICD10-I70.0). Electronically Signed   By: Inez Catalina M.D.   On: 03/14/2020 18:57   US Venous Img Lower Unilateral Left (DVT)  Result Date: 03/14/2020 CLINICAL DATA:  Shortness of breath EXAM: Left LOWER EXTREMITY VENOUS DOPPLER ULTRASOUND TECHNIQUE: Gray-scale sonography with compression, as well as color and duplex ultrasound, were performed to evaluate the deep venous system(s) from the level of the common femoral vein through the popliteal and proximal calf veins. COMPARISON:  None. FINDINGS: VENOUS Normal compressibility of the common femoral, superficial femoral, and popliteal veins, as well as the visualized calf veins. Visualized portions of profunda femoral vein and great saphenous vein  unremarkable. No filling defects to suggest DVT on grayscale or color Doppler imaging. Doppler waveforms show normal direction of venous flow, normal respiratory plasticity and response to augmentation. Limited views of the contralateral common femoral vein are unremarkable. OTHER None. Limitations: none IMPRESSION: Negative. Electronically Signed   By: Donavan Foil M.D.   On: 03/14/2020 17:53   DG Chest Port 1 View  Result Date: 03/14/2020 CLINICAL DATA:  Increasing shortness of breath.  COVID positive. EXAM: PORTABLE CHEST 1 VIEW COMPARISON:  03/10/2020  FINDINGS: Interval increase in the size of a layering right pleural effusion, now large. No substantial left pleural effusion. Increased overlying opacities in the right lung. Cardiopericardial silhouette is similar to prior, but partially obscured. No discernible pneumothorax. Improved opacities in the left lung with improved aeration of the left lung. IMPRESSION: Interval increase in the size of a layering right pleural effusion, now large. Increased overlying opacities in the right lung may represent atelectasis, aspiration, and/or pneumonia. Improved aeration of the left lung. Electronically Signed   By: Margaretha [place] MD   On: 03/14/2020 13:10   CT IMAGE GUIDED DRAINAGE BY PERCUTANEOUS CATHETER  Result Date: 03/15/2020 INDICATION: 69 year old male with a history of right-sided empyema EXAM: CT GUIDED DRAINAGE OF CHEST ABSCESS MEDICATIONS: The patient is currently admitted to the hospital and receiving intravenous antibiotics. The antibiotics were administered within an appropriate time frame prior to the initiation of the procedure. ANESTHESIA/SEDATION: 1.0 mg IV Versed 50 mcg IV Fentanyl Moderate Sedation Time:  21 The patient was continuously monitored during the procedure by the interventional radiology nurse under my direct supervision. COMPLICATIONS: None TECHNIQUE: Informed written consent was obtained from the patient after a thorough  discussion of the procedural risks, benefits and alternatives. All questions were addressed. Maximal Sterile Barrier Technique was utilized including caps, mask, sterile gowns, sterile gloves, sterile drape, hand hygiene and skin antiseptic. A timeout was performed prior to the initiation of the procedure. PROCEDURE: The procedure, risks, benefits, and alternatives were explained to the patient/patient's family, who provided informed consent on the patient's behalf. Specific risks that were addressed included bleeding, infection, ongoing pneumothorax, need for further procedure/surgery, chance of hemorrhage, hemoptysis, cardiopulmonary collapse, death. Questions regarding the procedure were encouraged and answered. The patient understands and consents to the procedure. Patient was positioned in the prone position on the CT table and scout image of the chest was performed for planning purposes. Patient was prepped and draped in the usual sterile fashion. The skin and subcutaneous tissues were generously infiltrated 1% lidocaine for local anesthesia. A Yueh needle was then used to enter the pleural space with aspiration of fluid. The plastic Yueh catheter was advanced into the pleural space and an 035 guidewire was advanced to the apex of the lung under fluoroscopy. Dilation of the skin tract was performed over the wire, and then modified Seldinger technique was used to place a 12 French pigtail catheter. Catheter was attached to water seal chamber and suction was applied confirming a operational chest tube. Retention suture was placed.  Sterile dressing was placed. Patient tolerated the procedure well and remained hemodynamically stable throughout. No complications were encountered and no significant blood loss was encounter FINDINGS: Empyema of the left chest. At the completion, 12 French drain is within the pleural space. IMPRESSION: Status post right pleural drain for evacuation of empyema. Signed, Dulcy Fanny.  Dellia Nims, RPVI [facility] Radiology Electronically Signed   By: Corrie Mckusick D.O.   On: 03/15/2020 17:46   US THORACENTESIS ASP PLEURAL SPACE W/IMG GUIDE  Result Date: 03/14/2020 INDICATION: RIGHT pleural effusion EXAM: ULTRASOUND GUIDED DIAGNOSTIC AND THERAPEUTIC RIGHT THORACENTESIS MEDICATIONS: None. COMPLICATIONS: None immediate. PROCEDURE: An ultrasound guided thoracentesis was thoroughly discussed with the patient and questions answered. The benefits, risks, alternatives and complications were also discussed. The patient understands and wishes to proceed with the procedure. Written consent was obtained. Ultrasound was performed to localize and mark an adequate pocket of fluid in the posterior RIGHT chest. The area was then  prepped and draped in the normal sterile fashion. 1% Lidocaine was used for local anesthesia. Under ultrasound guidance a 8 French thoracentesis catheter was introduced. Thoracentesis was performed. The catheter was removed and a dressing applied. FINDINGS: A total of approximately 400 mL of thick dark old bloody RIGHT pleural fluid was removed. Samples were sent to the laboratory as requested by the clinical team. IMPRESSION: Successful ultrasound guided RIGHT thoracentesis yielding 400 mL of dark old bloody pleural fluid. Electronically Signed   By: Lavonia Dana M.D.   On: 03/14/2020 15:27     Labs:   Basic Metabolic Panel: Recent Labs  Lab 03/16/20 0053 03/16/20 0053 03/17/20 0330 03/18/20 0209 03/20/20 0736 03/22/20 0834  NA 133*  --  134* 135  --   --   K 4.3   < > 4.8 3.9  --   --   CL 96*  --  98 96*  --   --   CO2 28  --  29 27  --   --   GLUCOSE 124*  --  112* 152*  --   --   BUN 13  --  7* 10  --   --   CREATININE 0.84  --  0.68 0.61 0.75 0.77  CALCIUM 8.2*  --  8.4* 8.5*  --   --    < > = values in this interval not displayed.   GFR Estimated Creatinine Clearance: 111 mL/min (by C-G formula based on  SCr of 0.77 mg/dL). Liver Function Tests: No results for input(s): AST, ALT, ALKPHOS, BILITOT, PROT, ALBUMIN in the last 168 hours. No results for input(s): LIPASE, AMYLASE in the last 168 hours. No results for input(s): AMMONIA in the last 168 hours. Coagulation profile No results for input(s): INR, PROTIME in the last 168 hours.  CBC: Recent Labs  Lab 03/16/20 0053 03/16/20 0053 03/17/20 0330 03/18/20 0209 03/20/20 0736 03/21/20 0206 03/22/20 0834  WBC 9.1   < > 7.3 9.0 8.2 8.2 7.6  NEUTROABS 7.0  --  5.2 6.4  --   --   --   HGB 12.7*   < > 12.8* 13.0 13.4 13.1 12.5*  HCT 38.4*   < > 39.5 39.3 41.0 40.1 38.4*  MCV 96.2   < > 96.1 94.7 97.6 96.6 97.5  PLT 244   < > 247 265 300 239 295   < > = values in this interval not displayed.   Cardiac Enzymes: No results for input(s): CKTOTAL, CKMB, CKMBINDEX, TROPONINI in the last 168 hours. BNP: Invalid input(s): POCBNP CBG: No results for input(s): GLUCAP in the last 168 hours. D-Dimer No results for input(s): DDIMER in the last 72 hours. Hgb A1c No results for input(s): HGBA1C in the last 72 hours. Lipid Profile No results for input(s): CHOL, HDL, LDLCALC, TRIG, CHOLHDL, LDLDIRECT in the last 72 hours. Thyroid function studies No results for input(s): TSH, T4TOTAL, T3FREE, THYROIDAB in the last 72 hours.  Invalid input(s): FREET3 Anemia work up No results for input(s): VITAMINB12, FOLATE, FERRITIN, TIBC, IRON, RETICCTPCT in the last 72 hours. Microbiology Recent Results (from the past 240 hour(s))  SARS Coronavirus 2 by RT PCR (hospital order, performed in [facility] hospital lab) Nasopharyngeal Nasopharyngeal Swab     Status: None   Collection Time: 03/14/20 12:56 PM   Specimen: Nasopharyngeal Swab  Result Value Ref Range Status   SARS Coronavirus 2 NEGATIVE NEGATIVE Final    Comment: (NOTE) SARS-CoV-2 target nucleic acids are NOT DETECTED.  The  SARS-CoV-2 RNA is generally detectable in upper and lower respiratory  specimens during the acute phase of infection. The lowest concentration of SARS-CoV-2 viral copies this assay can detect is 250 copies / mL. A negative result does not preclude SARS-CoV-2 infection and should not be used as the sole basis for treatment or other patient management decisions.  A negative result may occur with improper specimen collection / handling, submission of specimen other than nasopharyngeal swab, presence of viral mutation(s) within the areas targeted by this assay, and inadequate number of viral copies (<250 copies / mL). A negative result must be combined with clinical observations, patient history, and epidemiological information.  Fact Sheet for Patients:   StrictlyIdeas.no  Fact Sheet for Healthcare Providers: BankingDealers.co.za  This test is not yet approved or  cleared by the [place] FDA and has been authorized for detection and/or diagnosis of SARS-CoV-2 by FDA under an Emergency Use Authorization (EUA).  This EUA will remain in effect (meaning this test can be used) for the duration of the COVID-19 declaration under Section 564(b)(1) of the Act, 21 U.S.C. section 360bbb-3(b)(1), unless the authorization is terminated or revoked sooner.  Performed at [facility], [address] [place], [place] [address]   Gram stain     Status: None   Collection Time: 03/14/20  2:17 PM   Specimen: Pleura; Body Fluid  Result Value Ref Range Status   Specimen Description PLEURAL  Final   Special Requests NONE  Final   Gram Stain   Final    GRAM POSITIVE COCCI Gram Stain Report Called to,Read Back By and Verified With: CARDWELL,L ON 03/14/20 AT 1640 BY LOY,C WBC PRESENT,BOTH PMN AND MONONUCLEAR CYTOSPIN SMEAR PERFORMED AT [facility] Performed at [facility], [address] [place], [place] [address]    Report Status 03/14/2020 FINAL  Final  Culture, body fluid-bottle     Status: Abnormal   Collection Time: 03/14/20   2:48 PM   Specimen: Pleura  Result Value Ref Range Status   Specimen Description   Final    PLEURAL Performed at [facility], [address], [place], [place] [address]    Special Requests   Final    10CC Performed at [facility], [address], [place], [place] [address]    Gram Stain   Final    GRAM POSITIVE COCCI IN BOTH AEROBIC AND ANAEROBIC BOTTLES Gram Stain Report Called to,Read Back By and Verified With: M DOSS,RN @0031  03/15/20 [facility] Performed at [facility], [address] [place], [place] [address]    Culture STAPHYLOCOCCUS AUREUS (A)  Final   Report Status 03/17/2020 FINAL  Final   Organism ID, Bacteria STAPHYLOCOCCUS AUREUS  Final      Susceptibility   Staphylococcus aureus - MIC*    CIPROFLOXACIN <=0.5 SENSITIVE Sensitive     ERYTHROMYCIN <=0.25 SENSITIVE Sensitive     GENTAMICIN <=0.5 SENSITIVE Sensitive     OXACILLIN <=0.25 SENSITIVE Sensitive     TETRACYCLINE <=1 SENSITIVE Sensitive     VANCOMYCIN <=0.5 SENSITIVE Sensitive     TRIMETH/SULFA <=10 SENSITIVE Sensitive     CLINDAMYCIN <=0.25 SENSITIVE Sensitive     RIFAMPIN <=0.5 SENSITIVE Sensitive     Inducible Clindamycin NEGATIVE Sensitive     * STAPHYLOCOCCUS AUREUS  MRSA PCR Screening     Status: None   Collection Time: 03/15/20 10:59 AM   Specimen: Nasopharyngeal  Result Value Ref Range Status   MRSA by PCR NEGATIVE NEGATIVE Final    Comment:  The GeneXpert MRSA Assay (FDA approved for NASAL specimens only), is one component of a comprehensive MRSA colonization surveillance program. It is not intended to diagnose MRSA infection nor to guide or monitor treatment for MRSA infections. Performed at [facility], [place] [address] [place], [place] [address]   Aerobic/Anaerobic Culture (surgical/deep wound)     Status: None   Collection Time: 03/15/20  4:15 PM   Specimen: Pleural Fluid  Result Value Ref Range Status   Specimen Description FLUID RIGHT PLEURAL  Final   Special Requests  NONE  Final   Gram Stain   Final    RARE WBC PRESENT, PREDOMINANTLY PMN RARE GRAM POSITIVE COCCI    Culture   Final    MODERATE STAPHYLOCOCCUS AUREUS NO ANAEROBES ISOLATED Performed at [facility], [place] [address] [place], [place] [address]    Report Status 03/20/2020 FINAL  Final   Organism ID, Bacteria STAPHYLOCOCCUS AUREUS  Final      Susceptibility   Staphylococcus aureus - MIC*    CIPROFLOXACIN <=0.5 SENSITIVE Sensitive     ERYTHROMYCIN <=0.25 SENSITIVE Sensitive     GENTAMICIN <=0.5 SENSITIVE Sensitive     OXACILLIN <=0.25 SENSITIVE Sensitive     TETRACYCLINE <=1 SENSITIVE Sensitive     VANCOMYCIN 1 SENSITIVE Sensitive     TRIMETH/SULFA <=10 SENSITIVE Sensitive     CLINDAMYCIN <=0.25 SENSITIVE Sensitive     RIFAMPIN <=0.5 SENSITIVE Sensitive     Inducible Clindamycin NEGATIVE Sensitive     * MODERATE STAPHYLOCOCCUS AUREUS     Signed: Tanush Drees  [facility] 03/22/2020, 11:07 AM

## 2020-03-22 NOTE — TOC Transition Note (Signed)
Transition of Care (TOC) - CM/SW Discharge Note Marvetta Gibbons RN, BSN Transitions of Care Unit 4E- RN Case Manager See Treatment Team for direct phone #    Patient Details  Name: Ryan Turner MRN: [id number] Date of Birth: 02/10/1951  Transition of Care [facility]) CM/SW Contact:  Dawayne Patricia, RN Phone Number: 03/22/2020, 12:33 PM   Clinical Narrative:    Pt from home w/ wife, was active with [facility] for HHPT, and has home 02 with [employer]. Per pt he has RW at home and no other DME needs noted.  Order placed to resume HHPT - notified Tommi Rumps with [facility] for resumption of HHPT needs.  No further TOC needs noted.    Final next level of care: [place] Barriers to Discharge: No Barriers Identified   Patient Goals and CMS Choice Patient states their goals for this hospitalization and ongoing recovery are:: return home      Discharge Placement               Home with [facility]        Discharge Plan and Services   Discharge Planning Services: CM Consult Post Acute Care Choice: Home Health, Resumption of Svcs/PTA Provider                    HH Arranged: PT [place]: [place] Date [place]: 03/22/20 Time [place]: 1233 Representative spoke with at [place]: [place] ([place]) Interventions     Readmission Risk Interventions Readmission Risk Prevention Plan 03/22/2020  Post Dischage Appt Complete  Medication Screening Complete  Transportation Screening Complete  Some recent data might be hidden

## 2020-03-23 ENCOUNTER — Ambulatory Visit: Payer: BC Managed Care – PPO | Admitting: Family Medicine

## 2020-03-24 ENCOUNTER — Other Ambulatory Visit: Payer: Self-pay

## 2020-03-24 ENCOUNTER — Telehealth: Payer: Self-pay

## 2020-03-24 ENCOUNTER — Ambulatory Visit (INDEPENDENT_AMBULATORY_CARE_PROVIDER_SITE_OTHER): Payer: BC Managed Care – PPO | Admitting: Family Medicine

## 2020-03-24 VITALS — BP 120/70 | HR 108 | Temp 97.5°F | Ht 73.0 in | Wt 238.0 lb

## 2020-03-24 DIAGNOSIS — R233 Spontaneous ecchymoses: Secondary | ICD-10-CM

## 2020-03-24 DIAGNOSIS — Z09 Encounter for follow-up examination after completed treatment for conditions other than malignant neoplasm: Secondary | ICD-10-CM

## 2020-03-24 DIAGNOSIS — R0689 Other abnormalities of breathing: Secondary | ICD-10-CM | POA: Diagnosis not present

## 2020-03-24 DIAGNOSIS — J869 Pyothorax without fistula: Secondary | ICD-10-CM

## 2020-03-24 DIAGNOSIS — R0600 Dyspnea, unspecified: Secondary | ICD-10-CM

## 2020-03-24 NOTE — Progress Notes (Signed)
Subjective:    Patient ID: Ryan Turner, male    DOB: May 06, 1951, 69 y.o.   MRN: [id number]  HPI  Patient is a very pleasant 69 year old Caucasian male here today for a follow-up from his hospital discharge.    Admit date: 03/14/2020 Discharge date: 03/22/2020  Admitted From: home Discharge disposition: home   Code Status: DNR  Diet Recommendation: regular diet  Discharge Diagnosis:   Principal Problem:   Empyema of right pleural space ([place]) Active Problems:   Hypertension   Pneumonia due to COVID-19 virus   DVT (deep venous thrombosis) ([place])   Pressure injury of skin  History of Present Illness / Brief narrative:  Ryan Turner an 69 y.o.male with recent history of Covid pneumonia, diagnosed 02/14/2020, hospitalized 02/20/2020 to 03/03/2020.  Discharge on supplemental oxygen on steroids.  His hospital course was also complicated by acute DVT of his left lower extremity and was started on Eliquis.  Post discharge, he was gradually improving but started having worsening shortness of breath and right-sided pleuritic pain.Marland Kitchen He was seen by his pulmonologist as an outpatient on 8/24 and subsequently sent to the ED. Chest x-ray showed right pleural effusion. He underwent a right thoracentesis in IR 03/14/2020 by Dr. Thornton Papas yielding 400 mL.  He was transferred and admitted to [facility] for further management. See below for details.  Subjective:  Seen and examined this am.  Not in distress. Chest tube out yesterday.  Wants to go home.  Hospital Course:  Loculated empyema of right pleural space -Chest x-ray showed right pleural effusion. He underwent a right thoracentesis in IR 03/14/2020 by Dr. Thornton Papas yielding 400 mL.  -Subsequent CTA chest revealed large right pleural effusion despite recent thoracentesis.   -Cardiothoracic surgery for the right chest tube and administered TPA. Patient clinically improved. -Chest tube removed on 8/31. Chest x-ray shows interval  improvement. -Cultures grew MSSA continue antibiotics forfor 2 weeks as an outpatient. -on Keflex 500 mg q8 hrs. Plan for 2 more weeks to complete a total of 3 weeks.   Acute on chronic respiratory failure with hypoxia  -secondary to COVID.  -On 2lpm O2 at rest and 4lpm on ambulation.   Recent left lower extremity DVT: -Eliquis to resume at home.  History of a recent pneumonia due to COVID-19 virus Discharged on 3 to 6 L of oxygen, continue steroid taper to finish as planned.  Pressure injury - buttocks stage 2.  -seen by wound care as below.  03/24/20 Patient is here today for evaluation.  He states that he feels much better than his last visit.  Chest tube was removed.  I evaluated the wound.  The wound is well-healed.  There is a small purple bruise on his right flank.  There is no surrounding erythema.  There is no crepitus under the skin.  There is no induration.  Patient denies any chest pain or pleurisy.  He is still short of breath however he is at his baseline since discharge with Covid.  He does have petechia and purpura all over his legs.  He states that the petechiae or on his legs prior to his discharge from the hospital.  He did have heparin in the hospital.  His platelet count on discharge was normal.  Since discharge, he states the petechiae have not worsened although he has had one episode of blood in his urine.  He denies any epistaxis or bleeding gums or conjunctival hemorrhages.  He denies headache or neck stiffness.  He is taking Keflex.  Past Medical History:  Diagnosis Date  . Cataract 2014   removed  . COVID-19   . DDD (degenerative disc disease), lumbar   . Diverticulosis   . Headache   . Hypertension   . IBS (irritable bowel syndrome)    Past Surgical History:  Procedure Laterality Date  . cataracts    . COLON SURGERY  1995   fistula  . COLONOSCOPY    . EYE SURGERY  2014   catarats/lens replaced  . mohs  2015   skin cancer from nose  . POLYPECTOMY     . VASECTOMY  1985   Current Outpatient Medications on File Prior to Visit  Medication Sig Dispense Refill  . acetaminophen (TYLENOL) 325 MG tablet Take 2 tablets (650 mg total) by mouth every 6 (six) hours as needed for mild pain or headache (fever >/= 101).    Derrill Memo ON 04/08/2020] apixaban (ELIQUIS) 5 MG TABS tablet Take 1 tablet (5 mg total) by mouth 2 (two) times daily. 60 tablet 0  . cephALEXin (KEFLEX) 500 MG capsule Take 1 capsule (500 mg total) by mouth every 8 (eight) hours for 14 days. 42 capsule 0  . docusate sodium (COLACE) 100 MG capsule Take 200 mg by mouth daily as needed for mild constipation or moderate constipation.     . Glucosamine-Chondroit-Vit C-Mn (GLUCOSAMINE CHONDROITIN COMPLX) CAPS Take 2 capsules by mouth daily. 1800 mg    . Multiple Vitamins-Minerals (OCUVITE PRESERVISION PO) Take 2 tablets by mouth daily.     . Omega-3 Fatty Acids (FISH OIL) 1200 MG CAPS Take 2 capsules by mouth daily.    . predniSONE (DELTASONE) 20 MG tablet Take 20 mg by mouth daily with breakfast. 1 tablet daily for 7 days    . saccharomyces boulardii (FLORASTOR) 250 MG capsule Take 1 capsule (250 mg total) by mouth 2 (two) times daily for 14 days. 28 capsule 0  . zolpidem (AMBIEN) 10 MG tablet Take 0.5-1 tablets (5-10 mg total) by mouth at bedtime as needed. for sleep 30 tablet 3  . celecoxib (CELEBREX) 200 MG capsule TAKE 1 CAPSULE (200 MG TOTAL) BY MOUTH AS NEEDED. (Patient not taking: Reported on 03/24/2020) 90 capsule 2   No current facility-administered medications on file prior to visit.   Allergies  Allergen Reactions  . Morphine     Other reaction(s): VOMITING   Social History   Socioeconomic History  . Marital status: Married    Spouse name: Not on file  . Number of children: Not on file  . Years of education: College  . Highest education level: Not on file  Occupational History  . Occupation: Teacher, music  Tobacco Use  . Smoking status: Current Some Day Smoker    Types: Cigars   . Smokeless tobacco: Never Used  . Tobacco comment: quit cigarettes in 1973  Vaping Use  . Vaping Use: Never used  Substance and Sexual Activity  . Alcohol use: Yes    Alcohol/week: 6.0 standard drinks    Types: 2 Cans of beer, 4 Shots of liquor per week  . Drug use: No  . Sexual activity: Yes    Birth control/protection: None  Other Topics Concern  . Not on file  Social History Narrative   Lives at home with wife.   Social Determinants of Health   Financial Resource Strain:   . Difficulty of Paying Living Expenses: Not on file  Food Insecurity:   . Worried About Charity fundraiser in the Last Year: Not  on file  . Ran Out of Food in the Last Year: Not on file  Transportation Needs:   . Lack of Transportation (Medical): Not on file  . Lack of Transportation (Non-Medical): Not on file  Physical Activity:   . Days of Exercise per Week: Not on file  . Minutes of Exercise per Session: Not on file  Stress:   . Feeling of Stress : Not on file  Social Connections:   . Frequency of Communication with Friends and Family: Not on file  . Frequency of Social Gatherings with Friends and Family: Not on file  . Attends Religious Services: Not on file  . Active Member of Clubs or Organizations: Not on file  . Attends Archivist Meetings: Not on file  . Marital Status: Not on file  Intimate Partner Violence:   . Fear of Current or Ex-Partner: Not on file  . Emotionally Abused: Not on file  . Physically Abused: Not on file  . Sexually Abused: Not on file      Review of Systems  All other systems reviewed and are negative.      Objective:   Physical Exam Vitals reviewed.  Constitutional:      General: He is not in acute distress.    Appearance: Normal appearance. He is normal weight. He is not ill-appearing or toxic-appearing.  HENT:     Right Ear: Tympanic membrane and ear canal normal.     Left Ear: Tympanic membrane and ear canal normal.     Nose: Nose normal.  No congestion or rhinorrhea.     Mouth/Throat:     Pharynx: No oropharyngeal exudate or posterior oropharyngeal erythema.  Eyes:     General:        Right eye: No discharge.        Left eye: No discharge.     Conjunctiva/sclera: Conjunctivae normal.  Cardiovascular:     Rate and Rhythm: Normal rate and regular rhythm.     Pulses: Normal pulses.     Heart sounds: Normal heart sounds. No murmur heard.  No friction rub.  Pulmonary:     Effort: No respiratory distress.     Breath sounds: Examination of the right-middle field reveals decreased breath sounds. Examination of the right-lower field reveals decreased breath sounds. Decreased breath sounds present. No wheezing, rhonchi or rales.    Abdominal:     General: There is no distension.     Palpations: Abdomen is soft.     Tenderness: There is no abdominal tenderness. There is no guarding.  Musculoskeletal:     Cervical back: Neck supple.  Lymphadenopathy:     Cervical: No cervical adenopathy.  Skin:    Findings: Bruising, petechiae and rash present.       Neurological:     General: No focal deficit present.     Mental Status: He is alert and oriented to person, place, and time.     Cranial Nerves: No cranial nerve deficit.           Assessment & Plan:  Decreased breath sounds at right lung base - Plan: DG Chest 2 View  Petechiae - Plan: CBC with Differential/Platelet, COMPLETE METABOLIC PANEL WITH GFR, Protime-INR, APTT  Dyspnea, unspecified type  Hospital discharge follow-up  Empyema of right pleural space ([place])  Empyema has clinically resolved.  However given the diminished breath sounds in the right base I am concerned about a pneumothorax.  I will obtain a chest x-ray to evaluate the size  and as long as it is small, it would likely resorb on its own.  There was a small amount of air in the pleural space on CT scan August 29.  Patient denies any fevers or signs of systemic illness.  The petechia on his legs  apparently are chronic from his hospitalization.  I will check a CBC to rule out heparin-induced thrombocytopenia.  Patient appears clinically well and therefore I feel the DIC is unlikely.  Assuming platelet counts are normal, most likely due to his anticoagulant/Eliquis.  Dyspnea is due to Covid pneumonia.  He is still requiring oxygen.  Patient will gradually taper off prednisone and has a follow-up appointment with his pulmonologist next week.  Recheck CBC and CMP today.  Determine follow-up appointment based on lab results and chest x-ray

## 2020-03-24 NOTE — Telephone Encounter (Signed)
Mel Physical Therapist from [facility] needed verbal orders for twice week for four weeks. Verbal orders were given

## 2020-03-25 DIAGNOSIS — K5790 Diverticulosis of intestine, part unspecified, without perforation or abscess without bleeding: Secondary | ICD-10-CM | POA: Diagnosis not present

## 2020-03-25 DIAGNOSIS — J1282 Pneumonia due to coronavirus disease 2019: Secondary | ICD-10-CM | POA: Diagnosis not present

## 2020-03-25 DIAGNOSIS — M519 Unspecified thoracic, thoracolumbar and lumbosacral intervertebral disc disorder: Secondary | ICD-10-CM | POA: Diagnosis not present

## 2020-03-25 DIAGNOSIS — J9601 Acute respiratory failure with hypoxia: Secondary | ICD-10-CM | POA: Diagnosis not present

## 2020-03-25 DIAGNOSIS — U071 COVID-19: Secondary | ICD-10-CM | POA: Diagnosis not present

## 2020-03-25 DIAGNOSIS — D696 Thrombocytopenia, unspecified: Secondary | ICD-10-CM | POA: Diagnosis not present

## 2020-03-25 DIAGNOSIS — Z6831 Body mass index (BMI) 31.0-31.9, adult: Secondary | ICD-10-CM | POA: Diagnosis not present

## 2020-03-25 DIAGNOSIS — Z9181 History of falling: Secondary | ICD-10-CM | POA: Diagnosis not present

## 2020-03-25 DIAGNOSIS — I82402 Acute embolism and thrombosis of unspecified deep veins of left lower extremity: Secondary | ICD-10-CM | POA: Diagnosis not present

## 2020-03-25 DIAGNOSIS — E669 Obesity, unspecified: Secondary | ICD-10-CM | POA: Diagnosis not present

## 2020-03-25 DIAGNOSIS — I10 Essential (primary) hypertension: Secondary | ICD-10-CM | POA: Diagnosis not present

## 2020-03-25 DIAGNOSIS — Z791 Long term (current) use of non-steroidal anti-inflammatories (NSAID): Secondary | ICD-10-CM | POA: Diagnosis not present

## 2020-03-25 DIAGNOSIS — K589 Irritable bowel syndrome without diarrhea: Secondary | ICD-10-CM | POA: Diagnosis not present

## 2020-03-25 DIAGNOSIS — F17210 Nicotine dependence, cigarettes, uncomplicated: Secondary | ICD-10-CM | POA: Diagnosis not present

## 2020-03-25 DIAGNOSIS — Z7901 Long term (current) use of anticoagulants: Secondary | ICD-10-CM | POA: Diagnosis not present

## 2020-03-25 LAB — COMPLETE METABOLIC PANEL WITH GFR
AG Ratio: 1.1 (calc) (ref 1.0–2.5)
ALT: 61 U/L — ABNORMAL HIGH (ref 9–46)
AST: 28 U/L (ref 10–35)
Albumin: 3.3 g/dL — ABNORMAL LOW (ref 3.6–5.1)
Alkaline phosphatase (APISO): 157 U/L — ABNORMAL HIGH (ref 35–144)
BUN/Creatinine Ratio: 31 (calc) — ABNORMAL HIGH (ref 6–22)
BUN: 20 mg/dL (ref 7–25)
CO2: 26 mmol/L (ref 20–32)
Calcium: 8.8 mg/dL (ref 8.6–10.3)
Chloride: 103 mmol/L (ref 98–110)
Creat: 0.65 mg/dL — ABNORMAL LOW (ref 0.70–1.18)
GFR, Est African American: 113 mL/min/{1.73_m2} (ref >=60)
GFR, Est Non African American: 98 mL/min/{1.73_m2} (ref >=60)
Globulin: 2.9 g/dL (calc) (ref 1.9–3.7)
Glucose, Bld: 120 mg/dL — ABNORMAL HIGH (ref 65–99)
Potassium: 4.9 mmol/L (ref 3.5–5.3)
Sodium: 138 mmol/L (ref 135–146)
Total Bilirubin: 0.3 mg/dL (ref 0.2–1.2)
Total Protein: 6.2 g/dL (ref 6.1–8.1)

## 2020-03-25 LAB — CBC WITH DIFFERENTIAL/PLATELET
Absolute Monocytes: 421 cells/uL (ref 200–950)
Basophils Absolute: 41 cells/uL (ref 0–200)
Basophils Relative: 0.5 %
Eosinophils Absolute: 0 cells/uL — ABNORMAL LOW (ref 15–500)
Eosinophils Relative: 0 %
HCT: 39.7 % (ref 38.5–50.0)
Hemoglobin: 13.6 g/dL (ref 13.2–17.1)
Lymphs Abs: 883 cells/uL (ref 850–3900)
MCH: 31.9 pg (ref 27.0–33.0)
MCHC: 34.3 g/dL (ref 32.0–36.0)
MCV: 93 fL (ref 80.0–100.0)
MPV: 9.6 fL (ref 7.5–12.5)
Monocytes Relative: 5.2 %
Neutro Abs: 6755 cells/uL (ref 1500–7800)
Neutrophils Relative %: 83.4 %
Platelets: 357 10*3/uL (ref 140–400)
RBC: 4.27 10*6/uL (ref 4.20–5.80)
RDW: 12.8 % (ref 11.0–15.0)
Total Lymphocyte: 10.9 %
WBC: 8.1 10*3/uL (ref 3.8–10.8)

## 2020-03-25 LAB — PROTIME-INR
INR: 1
Prothrombin Time: 10.5 s (ref 9.0–11.5)

## 2020-03-25 LAB — APTT: aPTT: 29 s (ref 23–32)

## 2020-03-27 ENCOUNTER — Ambulatory Visit (HOSPITAL_COMMUNITY)
Admission: RE | Admit: 2020-03-27 | Discharge: 2020-03-27 | Disposition: A | Discharge status: Home / Self Care | Payer: BC Managed Care – PPO | Source: Ambulatory Visit | Attending: Family Medicine | Admitting: Family Medicine

## 2020-03-27 ENCOUNTER — Other Ambulatory Visit: Payer: Self-pay

## 2020-03-27 DIAGNOSIS — R0689 Other abnormalities of breathing: Secondary | ICD-10-CM | POA: Diagnosis not present

## 2020-03-27 DIAGNOSIS — J9811 Atelectasis: Secondary | ICD-10-CM | POA: Diagnosis not present

## 2020-03-27 DIAGNOSIS — J90 Pleural effusion, not elsewhere classified: Secondary | ICD-10-CM | POA: Diagnosis not present

## 2020-03-28 ENCOUNTER — Ambulatory Visit: Payer: BC Managed Care – PPO | Admitting: Family Medicine

## 2020-03-29 DIAGNOSIS — M519 Unspecified thoracic, thoracolumbar and lumbosacral intervertebral disc disorder: Secondary | ICD-10-CM | POA: Diagnosis not present

## 2020-03-29 DIAGNOSIS — Z791 Long term (current) use of non-steroidal anti-inflammatories (NSAID): Secondary | ICD-10-CM | POA: Diagnosis not present

## 2020-03-29 DIAGNOSIS — Z7901 Long term (current) use of anticoagulants: Secondary | ICD-10-CM | POA: Diagnosis not present

## 2020-03-29 DIAGNOSIS — E669 Obesity, unspecified: Secondary | ICD-10-CM | POA: Diagnosis not present

## 2020-03-29 DIAGNOSIS — I10 Essential (primary) hypertension: Secondary | ICD-10-CM | POA: Diagnosis not present

## 2020-03-29 DIAGNOSIS — I82402 Acute embolism and thrombosis of unspecified deep veins of left lower extremity: Secondary | ICD-10-CM | POA: Diagnosis not present

## 2020-03-29 DIAGNOSIS — Z9181 History of falling: Secondary | ICD-10-CM | POA: Diagnosis not present

## 2020-03-29 DIAGNOSIS — D696 Thrombocytopenia, unspecified: Secondary | ICD-10-CM | POA: Diagnosis not present

## 2020-03-29 DIAGNOSIS — Z6831 Body mass index (BMI) 31.0-31.9, adult: Secondary | ICD-10-CM | POA: Diagnosis not present

## 2020-03-29 DIAGNOSIS — F17210 Nicotine dependence, cigarettes, uncomplicated: Secondary | ICD-10-CM | POA: Diagnosis not present

## 2020-03-29 DIAGNOSIS — K5790 Diverticulosis of intestine, part unspecified, without perforation or abscess without bleeding: Secondary | ICD-10-CM | POA: Diagnosis not present

## 2020-03-29 DIAGNOSIS — K589 Irritable bowel syndrome without diarrhea: Secondary | ICD-10-CM | POA: Diagnosis not present

## 2020-03-29 DIAGNOSIS — U071 COVID-19: Secondary | ICD-10-CM | POA: Diagnosis not present

## 2020-03-29 DIAGNOSIS — J1282 Pneumonia due to coronavirus disease 2019: Secondary | ICD-10-CM | POA: Diagnosis not present

## 2020-03-29 DIAGNOSIS — J9601 Acute respiratory failure with hypoxia: Secondary | ICD-10-CM | POA: Diagnosis not present

## 2020-03-30 ENCOUNTER — Ambulatory Visit (INDEPENDENT_AMBULATORY_CARE_PROVIDER_SITE_OTHER): Payer: BC Managed Care – PPO

## 2020-03-30 ENCOUNTER — Encounter: Payer: Self-pay | Admitting: Primary Care

## 2020-03-30 ENCOUNTER — Other Ambulatory Visit: Payer: Self-pay

## 2020-03-30 ENCOUNTER — Ambulatory Visit (INDEPENDENT_AMBULATORY_CARE_PROVIDER_SITE_OTHER): Payer: BC Managed Care – PPO | Admitting: Primary Care

## 2020-03-30 VITALS — BP 128/68 | HR 94 | Temp 97.2°F | Ht 73.0 in | Wt 244.6 lb

## 2020-03-30 DIAGNOSIS — J1282 Pneumonia due to coronavirus disease 2019: Secondary | ICD-10-CM

## 2020-03-30 DIAGNOSIS — I82451 Acute embolism and thrombosis of right peroneal vein: Secondary | ICD-10-CM | POA: Diagnosis not present

## 2020-03-30 DIAGNOSIS — J479 Bronchiectasis, uncomplicated: Secondary | ICD-10-CM | POA: Diagnosis not present

## 2020-03-30 DIAGNOSIS — J869 Pyothorax without fistula: Secondary | ICD-10-CM | POA: Diagnosis not present

## 2020-03-30 DIAGNOSIS — J9601 Acute respiratory failure with hypoxia: Secondary | ICD-10-CM | POA: Diagnosis not present

## 2020-03-30 DIAGNOSIS — U071 COVID-19: Secondary | ICD-10-CM

## 2020-03-30 DIAGNOSIS — J9811 Atelectasis: Secondary | ICD-10-CM | POA: Diagnosis not present

## 2020-03-30 DIAGNOSIS — J90 Pleural effusion, not elsewhere classified: Secondary | ICD-10-CM | POA: Diagnosis not present

## 2020-03-30 DIAGNOSIS — J439 Emphysema, unspecified: Secondary | ICD-10-CM | POA: Diagnosis not present

## 2020-03-30 LAB — BASIC METABOLIC PANEL
BUN: 15 mg/dL (ref 6–23)
CO2: 32 mEq/L (ref 19–32)
Calcium: 9.3 mg/dL (ref 8.4–10.5)
Chloride: 100 mEq/L (ref 96–112)
Creatinine, Ser: 0.73 mg/dL (ref 0.40–1.50)
GFR: 105.75 mL/min (ref >60.00)
Glucose, Bld: 101 mg/dL — ABNORMAL HIGH (ref 70–99)
Potassium: 4.5 mEq/L (ref 3.5–5.1)
Sodium: 139 mEq/L (ref 135–145)

## 2020-03-30 MED ORDER — APIXABAN 5 MG PO TABS: 5.0000 mg | ORAL_TABLET | Freq: Two times a day (BID) | ORAL | 1 refills | Status: DC

## 2020-03-30 NOTE — Patient Instructions (Addendum)
Orders: - CXR today (ordered-already done) - Labs (BMET- ordered) - Ambulatory walk to qualify for POC - please check O2 first on room air if >88% then ambulate and titrate oxygen to stay above 88-90%  Left lower extremity DVT: - Continue Eliquis 5mg  twice daily for 3 months (Early-Mid November) - Do not take NSAIDs or Celebrex when on blood thinner   Covid-pneumonia:  - CXR today showed small right pleural effusion, mild atelectasis. Predominantly interstitial type densities elsewhere in the right greater than left lungs are unchanged with bronchiectasis demonstrated on CT - Due for vaccine 60-90 days   Follow-up:  - Dr. Melvyn Novas in October (please change to [place])    Bronchiectasis  Bronchiectasis is a condition in which the airways in the lungs (bronchi) are damaged and widened. The condition makes it hard for the lungs to get rid of mucus, and it causes mucus to gather in the bronchi. This condition often leads to lung infections, which can make the condition worse. What are the causes? You can be born with this condition or you can develop it later in life. Common causes of this condition include:  Cystic fibrosis.  Repeated lung infections, such as pneumonia or tuberculosis.  An object or other blockage in the lungs.  Breathing in fluid, food, or other objects (aspiration).  A problem with the immune system and lung structure that is present at birth (congenital). Sometimes the cause is not known. What are the signs or symptoms? Common symptoms of this condition include:  A daily cough that brings up mucus and lasts for more than 3 weeks.  Lung infections that happen often.  Shortness of breath and wheezing.  Weakness and fatigue. How is this diagnosed? This condition is diagnosed with tests, such as:  Chest X-rays or CT scans. These are done to check for changes in the lungs.  Breathing tests. These are done to check how well your lungs are working.  A  test of a sample of your saliva (sputum culture). This test is done to check for infection.  Blood tests and other tests. These are done to check for related diseases or causes. How is this treated? Treatment for this condition depends on the severity of the illness and its cause. Treatment may include:  Medicines that loosen mucus so it can be coughed up (expectorants).  Medicines that relax the muscles of the bronchi (bronchodilators).  Antibiotic medicines to prevent or treat infection.  Physical therapy to help clear mucus from the lungs. Techniques may include: ? Postural drainage. This is when you sit or lie in certain positions so that mucus can drain by gravity. ? Chest percussion. This involves tapping the chest or back with a cupped hand. ? Chest vibration. For this therapy, a hand or special equipment vibrates your chest and back.  Surgery to remove the affected part of the lung. This may be done in severe cases. Follow these instructions at home: Medicines  Take over-the-counter and prescription medicines only as told by your health care provider.  If you were prescribed an antibiotic medicine, take it as told by your health care provider. Do not stop taking the antibiotic even if you start to feel better.  Avoid taking sedatives and antihistamines unless your health care provider tells you to take them. These medicines tend to thicken the mucus in the lungs. Managing symptoms  Perform breathing exercises or techniques to clear your lungs as told by your health care provider.  Consider using a cold  steam vaporizer or humidifier in your room or home to help loosen secretions.  If you have a cough that gets worse at night, try sleeping in a semi-upright position. General instructions  Get plenty of rest.  Drink enough fluid to keep your urine clear or pale yellow.  Stay inside when pollution and ozone levels are high.  Stay up to date with vaccinations and  immunizations.  Avoid cigarette smoke and other lung irritants.  Do not use any products that contain nicotine or tobacco, such as cigarettes and e-cigarettes. If you need help quitting, ask your health care provider.  Keep all follow-up visits as told by your health care provider. This is important. Contact a health care provider if:  You cough up more sputum than before and the sputum is yellow or green in color.  You have a fever.  You cannot control your cough and are losing sleep. Get help right away if:  You cough up blood.  You have chest pain.  You have increasing shortness of breath.  You have pain that gets worse or is not controlled with medicines.  You have a fever and your symptoms suddenly get worse. Summary  Bronchiectasis is a condition in which the airways in the lungs (bronchi) are damaged and widened. The condition makes it hard for the lungs to get rid of mucus, and it causes mucus to gather in the bronchi.  Treatment usually includes therapy to help clear mucus from the lungs.  Stay up to date with vaccinations and immunizations. This information is not intended to replace advice given to you by your health care provider. Make sure you discuss any questions you have with your health care provider. Document Revised: 06/20/2017 Document Reviewed: 08/12/2016 Elsevier Patient Education  2020 [employer].

## 2020-03-30 NOTE — Progress Notes (Signed)
@Patient  ID: Ryan Turner, male    DOB: 13-Oct-1950, 69 y.o.   MRN: [id number]  Chief Complaint  Patient presents with  . Follow-up    PHOS x 1wk., sob sl better    Referring provider: [name], MD  HPI: 69 year old male, former smoker quit in 2021. PMH significant for hypertension, covid-19 pneumonia, empyema right pleural space, acute respiratory failure, LLE DVT.   Patient was diagnosed with covid on July 26th 2021. He was admitted from 02/20/20-03/03/20 for covid-pneumonia. Treated with Remdesivir, Actemra, steroids. Hospital course complicated by LLE DVT and discharge on Eliquis. He was re-admitted on 03/14/20 for increased shortness of breath. CXR showed right pleural effusion. He underwent right thoracentesis on 03/14/20 which put out 446ml. Subsequent CTA was negative for PE but showed large pleural effusion. CT surgery placed chest tube and he received TPA. Chest tube removed on 8/31. CXR showed improvement. Cultures grew MSSA, placed on Keflex 500mg  TID x 2 weeks.  03/30/2020 Patient presents today for hospital follow-up. Accompanied by his wife. Shortness of breath is slightly better. He noticed a big difference in breathing after thoracentesis. He gets some winded with walking.  He is weaning prednisone, currently on 10 prednisone daily. He is doing physical therapy twice a week. He is on 3L oxygen, O2 saturation will drop to 85% on exertion but quickly recovers. Eating and drinking ok. Continues Eliquis 5mg  BID. He did notice dark colored urine and single blood clot on one occurance, this has since cleared up. Petechiae on his is mostly gone. Repeat doppler on 03/14/20 showed resolved LLE DVT. He has not received covid vaccine. No fevers. Denies pain.     Allergies  Allergen Reactions  . Morphine     Other reaction(s): VOMITING    Immunization History  Administered Date(s) Administered  . Influenza, High Dose Seasonal PF 04/17/2018, 05/19/2019, 05/22/2019  .  Influenza-Unspecified 03/22/2015, 03/01/2017  . Pneumococcal Conjugate-13 01/21/2015  . Pneumococcal Polysaccharide-23 07/22/2013  . Tdap 10/23/2015  . Zoster 07/22/2013  . Zoster Recombinat (Shingrix) 05/22/2019    Past Medical History:  Diagnosis Date  . Cataract 2014   removed  . COVID-19   . DDD (degenerative disc disease), lumbar   . Diverticulosis   . Headache   . Hypertension   . IBS (irritable bowel syndrome)     Tobacco History: Social History   Tobacco Use  Smoking Status Former Smoker  . Types: Cigars  . Quit date: 01/28/2020  . Years since quitting: 0.1  Smokeless Tobacco Never Used  Tobacco Comment   quit cigarettes in 1973   Counseling given: Not Answered Comment: quit cigarettes in 1973   Outpatient Medications Prior to Visit  Medication Sig Dispense Refill  . cephALEXin (KEFLEX) 500 MG capsule Take 1 capsule (500 mg total) by mouth every 8 (eight) hours for 14 days. 42 capsule 0  . docusate sodium (COLACE) 100 MG capsule Take 200 mg by mouth daily as needed for mild constipation or moderate constipation.     . Glucosamine-Chondroit-Vit C-Mn (GLUCOSAMINE CHONDROITIN COMPLX) CAPS Take 2 capsules by mouth daily. 1800 mg    . Multiple Vitamins-Minerals (OCUVITE PRESERVISION PO) Take 2 tablets by mouth daily.     . Omega-3 Fatty Acids (FISH OIL) 1200 MG CAPS Take 2 capsules by mouth daily.    . predniSONE (DELTASONE) 20 MG tablet Take 20 mg by mouth daily with breakfast. 1 tablet daily for 7 days    . saccharomyces boulardii (FLORASTOR) 250 MG capsule  Take 1 capsule (250 mg total) by mouth 2 (two) times daily for 14 days. 28 capsule 0  . zolpidem (AMBIEN) 10 MG tablet Take 0.5-1 tablets (5-10 mg total) by mouth at bedtime as needed. for sleep 30 tablet 3  . [START ON 04/08/2020] apixaban (ELIQUIS) 5 MG TABS tablet Take 1 tablet (5 mg total) by mouth 2 (two) times daily. 60 tablet 0  . acetaminophen (TYLENOL) 325 MG tablet Take 2 tablets (650 mg total) by mouth  every 6 (six) hours as needed for mild pain or headache (fever >/= 101). (Patient not taking: Reported on 03/30/2020)    . celecoxib (CELEBREX) 200 MG capsule TAKE 1 CAPSULE (200 MG TOTAL) BY MOUTH AS NEEDED. (Patient not taking: Reported on 03/24/2020) 90 capsule 2   No facility-administered medications prior to visit.   Review of Systems  Review of Systems  Respiratory: Positive for shortness of breath. Negative for cough and wheezing.   Cardiovascular: Negative.    Physical Exam  BP 128/68 (BP Location: Left Arm, Cuff Size: Normal)   Pulse 94   Temp (!) 97.2 F (36.2 C) (Oral)   Ht 6\' 1"  (1.854 m)   Wt 244 lb 9.6 oz (110.9 kg)   SpO2 99%   BMI 32.27 kg/m  Physical Exam Constitutional:      Appearance: Normal appearance.  HENT:     Head: Normocephalic and atraumatic.     Mouth/Throat:     Mouth: Mucous membranes are moist.     Pharynx: Oropharynx is clear.  Cardiovascular:     Rate and Rhythm: Normal rate and regular rhythm.  Pulmonary:     Effort: Pulmonary effort is normal.     Breath sounds: Normal breath sounds.     Comments: Diminished right base Neurological:     Mental Status: He is alert.  Psychiatric:        Mood and Affect: Mood normal.        Behavior: Behavior normal.        Thought Content: Thought content normal.        Judgment: Judgment normal.      Lab Results:  CBC    Component Value Date/Time   WBC 8.1 03/24/2020 1617   RBC 4.27 03/24/2020 1617   HGB 13.6 03/24/2020 1617   HCT 39.7 03/24/2020 1617   PLT 357 03/24/2020 1617   MCV 93.0 03/24/2020 1617   MCH 31.9 03/24/2020 1617   MCHC 34.3 03/24/2020 1617   RDW 12.8 03/24/2020 1617   LYMPHSABS 883 03/24/2020 1617   MONOABS 0.7 03/18/2020 0209   EOSABS 0 (L) 03/24/2020 1617   BASOSABS 41 03/24/2020 1617    BMET    Component Value Date/Time   NA 139 03/30/2020 1132   K 4.5 03/30/2020 1132   CL 100 03/30/2020 1132   CO2 32 03/30/2020 1132   GLUCOSE 101 (H) 03/30/2020 1132   BUN 15  03/30/2020 1132   CREATININE 0.73 03/30/2020 1132   CREATININE 0.65 (L) 03/24/2020 1617   CALCIUM 9.3 03/30/2020 1132   GFRNONAA 98 03/24/2020 1617   GFRAA 113 03/24/2020 1617    BNP    Component Value Date/Time   BNP 160.0 (H) 03/14/2020 1247    ProBNP No results found for: PROBNP  Imaging: DG Chest 1 View  Result Date: 03/14/2020 CLINICAL DATA:  RIGHT pneumothorax post thoracentesis EXAM: CHEST  1 VIEW COMPARISON:  Extra tori AP chest radiograph 1451 hours compared to earlier study of 1254 hours FINDINGS: Enlargement of  cardiac silhouette. BILATERAL pulmonary infiltrates consistent with history of recent COVID pneumonia. Again identified moderate to large RIGHT pleural effusion, accentuated by expiratory technique. Significant RIGHT basilar atelectasis. No pneumothorax following thoracentesis. IMPRESSION: No pneumothorax following RIGHT thoracentesis. Persistent RIGHT pleural effusion/hemothorax, RIGHT basilar atelectasis, and BILATERAL infiltrates. Electronically Signed   By: Lavonia Dana M.D.   On: 03/14/2020 15:28   DG Chest 2 View  Result Date: 03/30/2020 CLINICAL DATA:  Empyema.  Emphysema. EXAM: CHEST - 2 VIEW COMPARISON:  03/27/2020 FINDINGS: The cardiomediastinal silhouette is unchanged with normal heart size. A small right pleural effusion is unchanged. Mild right basilar atelectasis is again noted. Predominantly interstitial type densities elsewhere in the right greater than left lungs are unchanged with bronchiectasis demonstrated on CT. No pneumothorax is identified. No acute osseous abnormality is seen. IMPRESSION: Unchanged appearance of the lungs including a small right pleural effusion. Electronically Signed   By: Logan Bores M.D.   On: 03/30/2020 10:46   DG Chest 2 View  Result Date: 03/27/2020 CLINICAL DATA:  Empyema, follow-up examination EXAM: CHEST - 2 VIEW COMPARISON:  03/22/2020 FINDINGS: Lung volumes are small. Small right pleural effusion has slightly increased in  the interval since prior examination with associated right basilar discoid atelectasis. Superimposed asymmetric diffuse interstitial pulmonary infiltrate is again seen, progressive within the right upper lobe and improved at the left lung base, likely infectious or inflammatory. No pneumothorax. Cardiac size within normal limits. Pulmonary vascularity is normal. No acute bone abnormality. IMPRESSION: 1. Small right pleural effusion has slightly increased in the interval with associated right basilar discoid atelectasis. 2. Waxing and waning superimposed moderate pulmonary infiltrate, likely infectious or inflammatory. Electronically Signed   By: Fidela Salisbury MD   On: 03/27/2020 16:15   DG Chest 2 View  Result Date: 03/10/2020 CLINICAL DATA:  Dyspnea, abnormal chest auscultation EXAM: CHEST - 2 VIEW COMPARISON:  02/28/2020 FINDINGS: Since the prior examination, moderate right pleural effusion has developed with mild resultant right-sided volume loss. Extensive superimposed pulmonary infiltrate persists, in keeping with acute atypical infection. Infiltrate within the left lung has improved slightly since prior examination. No pneumothorax. Cardiac size within normal limits. No acute bone abnormality. IMPRESSION: 1. New moderate right pleural effusion with mild resultant right-sided volume loss. 2. Extensive superimposed pulmonary infiltrate persists, in keeping with acute atypical infection, improving. Electronically Signed   By: Fidela Salisbury MD   On: 03/10/2020 23:02   CT CHEST WO CONTRAST  Result Date: 03/19/2020 CLINICAL DATA:  Empyema EXAM: CT CHEST WITHOUT CONTRAST TECHNIQUE: Multidetector CT imaging of the chest was performed following the standard protocol without IV contrast. COMPARISON:  03/14/2020 FINDINGS: Cardiovascular: Heart is unremarkable without pericardial effusion. Normal caliber of the thoracic aorta. Stable atherosclerosis of the LAD distribution of the coronary vasculature.  Mediastinum/Nodes: No pathologic adenopathy. Thyroid, esophagus, and trachea are grossly normal. Lungs/Pleura: Pigtail drainage catheter is seen within the right posterior costophrenic angle. There has been near complete evacuation of the complex right pleural effusion since prior study, less than 100 cc remaining. Small amount of gas within the right pleural space likely sequela of drain placement. Trace free-flowing left pleural effusion, volume estimated less than 100 cc. Patchy bilateral areas of lung consolidation are again noted, consistent with multifocal pneumonia. Bilateral bronchiectasis again noted. Central airways are patent. Upper Abdomen: Limited imaging through the upper abdomen demonstrates no acute abnormalities. Musculoskeletal: No acute or destructive bony lesions. Reconstructed images demonstrate no additional findings. IMPRESSION: 1. Near complete evacuation of the complex right  pleural effusion after pigtail drainage catheter placement. Less than 100 cc of fluid remaining. 2. Patchy bilateral areas of airspace disease consistent with multifocal pneumonia. 3. Trace left pleural effusion. 4. Bilateral bronchiectasis. Electronically Signed   By: Randa Ngo M.D.   On: 03/19/2020 21:57   CT Angio Chest PE W and/or Wo Contrast  Result Date: 03/14/2020 CLINICAL DATA:  Shortness of breath, COVID-19 positivity, recent right thoracentesis EXAM: CT ANGIOGRAPHY CHEST WITH CONTRAST TECHNIQUE: Multidetector CT imaging of the chest was performed using the standard protocol during bolus administration of intravenous contrast. Multiplanar CT image reconstructions and MIPs were obtained to evaluate the vascular anatomy. CONTRAST:  143mL OMNIPAQUE IOHEXOL 350 MG/ML SOLN COMPARISON:  Chest x-ray from earlier in the same day. FINDINGS: Cardiovascular: Thoracic aorta demonstrates mild atherosclerotic calcification without aneurysmal dilatation or dissection. No significant cardiac enlargement is seen.  Scattered coronary calcifications are noted. The pulmonary artery shows a normal branching pattern. Motion artifact limits evaluation of the lower lobe branches. No definitive intraluminal filling defect to suggest pulmonary embolism is noted. Mediastinum/Nodes: Thoracic inlet is within normal limits. Scattered small hilar and mediastinal lymph nodes are noted likely reactive in nature. The esophagus as visualized is within normal limits. Lungs/Pleura: Patchy peripheral infiltrates are identified within both lungs consistent with the given clinical history of COVID-19 positivity. Additionally there is consolidation in the right lower lobe likely of a more compressive affect given the large right-sided pleural effusion. This persists despite recent thoracentesis. No pneumothorax is noted. Upper Abdomen: Visualized upper abdomen shows no acute abnormality. Musculoskeletal: No chest wall abnormality. No acute or significant osseous findings. Review of the MIP images confirms the above findings. IMPRESSION: Patchy bilateral airspace opacities consistent with the given clinical history of COVID-19 positivity. Large right-sided pleural effusion despite recent thoracentesis. This causes some compressive consolidation particularly in the right lower lobe. No pneumothorax is noted. No evidence of pulmonary emboli. Aortic Atherosclerosis (ICD10-I70.0). Electronically Signed   By: Inez Catalina M.D.   On: 03/14/2020 18:57   US Venous Img Lower Unilateral Left (DVT)  Result Date: 03/14/2020 CLINICAL DATA:  Shortness of breath EXAM: Left LOWER EXTREMITY VENOUS DOPPLER ULTRASOUND TECHNIQUE: Gray-scale sonography with compression, as well as color and duplex ultrasound, were performed to evaluate the deep venous system(s) from the level of the common femoral vein through the popliteal and proximal calf veins. COMPARISON:  None. FINDINGS: VENOUS Normal compressibility of the common femoral, superficial femoral, and popliteal  veins, as well as the visualized calf veins. Visualized portions of profunda femoral vein and great saphenous vein unremarkable. No filling defects to suggest DVT on grayscale or color Doppler imaging. Doppler waveforms show normal direction of venous flow, normal respiratory plasticity and response to augmentation. Limited views of the contralateral common femoral vein are unremarkable. OTHER None. Limitations: none IMPRESSION: Negative. Electronically Signed   By: Donavan Foil M.D.   On: 03/14/2020 17:53   DG Chest Port 1 View  Result Date: 03/22/2020 CLINICAL DATA:  Empyema. EXAM: PORTABLE CHEST 1 VIEW COMPARISON:  March 21, 2020. FINDINGS: Stable cardiomediastinal silhouette. No pneumothorax is noted. Small right pleural effusion is present. Stable bilateral lung opacities are noted concerning for multifocal pneumonia. Right-sided chest tube has been removed. Bony thorax is unremarkable. IMPRESSION: Stable bilateral lung opacities are noted concerning for multifocal pneumonia. Small right pleural effusion is present. Electronically Signed   By: Marijo Conception M.D.   On: 03/22/2020 08:45   DG Chest Port 1 View  Result Date: 03/21/2020 CLINICAL  DATA:  Empyema. EXAM: PORTABLE CHEST 1 VIEW COMPARISON:  March 19, 2020. FINDINGS: Stable cardiomediastinal silhouette. No pneumothorax is noted. Stable position of right basilar pigtail drainage catheter. Stable bilateral lung opacities are noted concerning for pneumonia. Bony thorax is unremarkable. Stable small right pleural effusion is noted. IMPRESSION: Stable position of right basilar pigtail drainage catheter. Stable bilateral lung opacities are noted concerning for pneumonia. Stable small right pleural effusion. Electronically Signed   By: Marijo Conception M.D.   On: 03/21/2020 08:10   DG CHEST PORT 1 VIEW  Result Date: 03/19/2020 CLINICAL DATA:  Chest tube in place. EXAM: PORTABLE CHEST 1 VIEW COMPARISON:  March 18, 2020 FINDINGS: The right chest  tube is stable. An apparent loculated component of right pleural air is stable. Bilateral patchy pulmonary infiltrates, right greater than left, may be mildly worsened on the right but are stable on the left. Stable cardiomediastinal silhouette. No left-sided pneumothorax. IMPRESSION: 1. Right chest tube in stable position. Persistent apparent loculated component of right pneumothorax, small. No change. 2. Worsening patchy infiltrate on the right. Stable infiltrate on the left diffusely. Electronically Signed   By: Dorise Bullion III M.D   On: 03/19/2020 08:26   DG CHEST PORT 1 VIEW  Result Date: 03/18/2020 CLINICAL DATA:  Evaluate right-sided chest tube EXAM: PORTABLE CHEST 1 VIEW COMPARISON:  March 16, 2020 and March 17, 2020 FINDINGS: There may be a small loculated pneumothorax laterally on the right. This is similar compared to recent comparison. A right chest tube remains in place. Multifocal infiltrate consistent with pneumonia remains. No other interval changes. IMPRESSION: 1. Small right-sided pneumothorax, unchanged. Right chest tube remains in place. 2. Persistent diffuse bilateral pulmonary infiltrates. Electronically Signed   By: Dorise Bullion III M.D   On: 03/18/2020 14:20   DG CHEST PORT 1 VIEW  Result Date: 03/17/2020 CLINICAL DATA:  Loculated pleural effusion EXAM: PORTABLE CHEST 1 VIEW COMPARISON:  03/16/2020 FINDINGS: Moderate loculated right pleural effusion with indwelling pigtail drain. Course patchy opacities in the lungs bilaterally, compatible with multifocal pneumonia. No pneumothorax. The heart is top-normal in size. IMPRESSION: Moderate loculated right pleural effusion with indwelling pigtail drain. Multifocal pneumonia, grossly unchanged. Electronically Signed   By: Julian Hy M.D.   On: 03/17/2020 07:53   DG Chest Port 1 View  Result Date: 03/16/2020 CLINICAL DATA:  Shortness of breath EXAM: PORTABLE CHEST 1 VIEW COMPARISON:  03/16/2020 FINDINGS: Right basilar  chest tube in place, unchanged. Small right pleural effusion. Extensive bilateral airspace disease, unchanged. Mild cardiomegaly. No pneumothorax or acute bony abnormality. IMPRESSION: Diffuse bilateral airspace disease, unchanged. Right basilar chest tube with small right pleural effusion. No pneumothorax. Electronically Signed   By: Rolm Baptise M.D.   On: 03/16/2020 17:32   DG CHEST PORT 1 VIEW  Result Date: 03/16/2020 CLINICAL DATA:  Shortness of breath, cough. EXAM: PORTABLE CHEST 1 VIEW COMPARISON:  March 14, 2020. FINDINGS: Stable cardiomegaly. Stable interstitial densities are noted throughout both lungs concerning for pulmonary edema or possibly pneumonia. Interval placement of pigtail drainage catheter into right lung base. Loculated right pleural effusion is slightly smaller in size. No pneumothorax is noted. Bony thorax is unremarkable. IMPRESSION: Interval placement of pigtail drainage catheter into right lung base. Loculated right pleural effusion is slightly smaller in size. Stable interstitial densities throughout both lungs concerning for pulmonary edema or possibly pneumonia. Electronically Signed   By: Marijo Conception M.D.   On: 03/16/2020 08:10   DG Chest [facility]  Result Date: 03/14/2020 CLINICAL DATA:  Increasing shortness of breath.  COVID positive. EXAM: PORTABLE CHEST 1 VIEW COMPARISON:  03/10/2020 FINDINGS: Interval increase in the size of a layering right pleural effusion, now large. No substantial left pleural effusion. Increased overlying opacities in the right lung. Cardiopericardial silhouette is similar to prior, but partially obscured. No discernible pneumothorax. Improved opacities in the left lung with improved aeration of the left lung. IMPRESSION: Interval increase in the size of a layering right pleural effusion, now large. Increased overlying opacities in the right lung may represent atelectasis, aspiration, and/or pneumonia. Improved aeration of the left lung.  Electronically Signed   By: Margaretha Sheffield MD   On: 03/14/2020 13:10   CT IMAGE GUIDED DRAINAGE BY PERCUTANEOUS CATHETER  Result Date: 03/15/2020 INDICATION: 69 year old male with a history of right-sided empyema EXAM: CT GUIDED DRAINAGE OF CHEST ABSCESS MEDICATIONS: The patient is currently admitted to the hospital and receiving intravenous antibiotics. The antibiotics were administered within an appropriate time frame prior to the initiation of the procedure. ANESTHESIA/SEDATION: 1.0 mg IV Versed 50 mcg IV Fentanyl Moderate Sedation Time:  21 The patient was continuously monitored during the procedure by the interventional radiology nurse under my direct supervision. COMPLICATIONS: None TECHNIQUE: Informed written consent was obtained from the patient after a thorough discussion of the procedural risks, benefits and alternatives. All questions were addressed. Maximal Sterile Barrier Technique was utilized including caps, mask, sterile gowns, sterile gloves, sterile drape, hand hygiene and skin antiseptic. A timeout was performed prior to the initiation of the procedure. PROCEDURE: The procedure, risks, benefits, and alternatives were explained to the patient/patient's family, who provided informed consent on the patient's behalf. Specific risks that were addressed included bleeding, infection, ongoing pneumothorax, need for further procedure/surgery, chance of hemorrhage, hemoptysis, cardiopulmonary collapse, death. Questions regarding the procedure were encouraged and answered. The patient understands and consents to the procedure. Patient was positioned in the prone position on the CT table and scout image of the chest was performed for planning purposes. Patient was prepped and draped in the usual sterile fashion. The skin and subcutaneous tissues were generously infiltrated 1% lidocaine for local anesthesia. A Yueh needle was then used to enter the pleural space with aspiration of fluid. The plastic  Yueh catheter was advanced into the pleural space and an 035 guidewire was advanced to the apex of the lung under fluoroscopy. Dilation of the skin tract was performed over the wire, and then modified Seldinger technique was used to place a 12 French pigtail catheter. Catheter was attached to water seal chamber and suction was applied confirming a operational chest tube. Retention suture was placed.  Sterile dressing was placed. Patient tolerated the procedure well and remained hemodynamically stable throughout. No complications were encountered and no significant blood loss was encounter FINDINGS: Empyema of the left chest. At the completion, 12 French drain is within the pleural space. IMPRESSION: Status post right pleural drain for evacuation of empyema. Signed, Dulcy Fanny. Dellia Nims, RPVI [facility] Electronically Signed   By: Corrie Mckusick D.O.   On: 03/15/2020 17:46   US THORACENTESIS ASP PLEURAL SPACE W/IMG GUIDE  Result Date: 03/14/2020 INDICATION: RIGHT pleural effusion EXAM: ULTRASOUND GUIDED DIAGNOSTIC AND THERAPEUTIC RIGHT THORACENTESIS MEDICATIONS: None. COMPLICATIONS: None immediate. PROCEDURE: An ultrasound guided thoracentesis was thoroughly discussed with the patient and questions answered. The benefits, risks, alternatives and complications were also discussed. The patient understands and wishes to proceed with the procedure. Written consent was obtained.  Ultrasound was performed to localize and mark an adequate pocket of fluid in the posterior RIGHT chest. The area was then prepped and draped in the normal sterile fashion. 1% Lidocaine was used for local anesthesia. Under ultrasound guidance a 8 French thoracentesis catheter was introduced. Thoracentesis was performed. The catheter was removed and a dressing applied. FINDINGS: A total of approximately 400 mL of thick dark old bloody RIGHT pleural fluid was removed. Samples were sent to  the laboratory as requested by the clinical team. IMPRESSION: Successful ultrasound guided RIGHT thoracentesis yielding 400 mL of dark old bloody pleural fluid. Electronically Signed   By: Lavonia Dana M.D.   On: 03/14/2020 15:27     Assessment & Plan:   Pneumonia due to COVID-19 virus - Clinically improved - Repeat CXR 03/30/2020 showed interstitial type densities R>L unchanged with bronchiextasis demonstrated by CT - Encourage IS/flutter valve, pulmonary hygiene - Likely will have residual scarring from COVID pneumonia. Recommend follow-up clinically, consider repeat PFTs and or HRCT imaging in 6 months to monitor  Empyema of right pleural space ([place]) - S/p thoracentesis 03/14/20 with 465ml output, patient reported noticeable improvement in breathing afterwards. Repeat imaging showed small residual right pleural effusion  DVT (deep venous thrombosis) (HCC) - Continue Eliquis 5mg  twice daily - Repeat doppler LLE DVT resolved  Acute respiratory failure with hypoxia ([place]) - Continues O2 3L/min and pulmonary rehab  Scheduled for FU with Dr. Melvyn Novas in October  Martyn Ehrich, NP 04/02/2020

## 2020-03-31 DIAGNOSIS — K5790 Diverticulosis of intestine, part unspecified, without perforation or abscess without bleeding: Secondary | ICD-10-CM | POA: Diagnosis not present

## 2020-03-31 DIAGNOSIS — F17210 Nicotine dependence, cigarettes, uncomplicated: Secondary | ICD-10-CM | POA: Diagnosis not present

## 2020-03-31 DIAGNOSIS — I82402 Acute embolism and thrombosis of unspecified deep veins of left lower extremity: Secondary | ICD-10-CM | POA: Diagnosis not present

## 2020-03-31 DIAGNOSIS — D696 Thrombocytopenia, unspecified: Secondary | ICD-10-CM | POA: Diagnosis not present

## 2020-03-31 DIAGNOSIS — M519 Unspecified thoracic, thoracolumbar and lumbosacral intervertebral disc disorder: Secondary | ICD-10-CM | POA: Diagnosis not present

## 2020-03-31 DIAGNOSIS — J9601 Acute respiratory failure with hypoxia: Secondary | ICD-10-CM | POA: Diagnosis not present

## 2020-03-31 DIAGNOSIS — K589 Irritable bowel syndrome without diarrhea: Secondary | ICD-10-CM | POA: Diagnosis not present

## 2020-03-31 DIAGNOSIS — Z9181 History of falling: Secondary | ICD-10-CM | POA: Diagnosis not present

## 2020-03-31 DIAGNOSIS — Z791 Long term (current) use of non-steroidal anti-inflammatories (NSAID): Secondary | ICD-10-CM | POA: Diagnosis not present

## 2020-03-31 DIAGNOSIS — U071 COVID-19: Secondary | ICD-10-CM | POA: Diagnosis not present

## 2020-03-31 DIAGNOSIS — I10 Essential (primary) hypertension: Secondary | ICD-10-CM | POA: Diagnosis not present

## 2020-03-31 DIAGNOSIS — J1282 Pneumonia due to coronavirus disease 2019: Secondary | ICD-10-CM | POA: Diagnosis not present

## 2020-03-31 DIAGNOSIS — Z7901 Long term (current) use of anticoagulants: Secondary | ICD-10-CM | POA: Diagnosis not present

## 2020-03-31 DIAGNOSIS — E669 Obesity, unspecified: Secondary | ICD-10-CM | POA: Diagnosis not present

## 2020-03-31 DIAGNOSIS — Z6831 Body mass index (BMI) 31.0-31.9, adult: Secondary | ICD-10-CM | POA: Diagnosis not present

## 2020-04-02 NOTE — Assessment & Plan Note (Signed)
-   Continues O2 3L/min and pulmonary rehab

## 2020-04-02 NOTE — Assessment & Plan Note (Addendum)
-   Clinically improved - Repeat CXR 03/30/2020 showed interstitial type densities R>L unchanged with bronchiextasis demonstrated by CT - Encourage IS/flutter valve, pulmonary hygiene - Likely will have residual scarring from COVID pneumonia. Recommend follow-up clinically, consider repeat PFTs and or HRCT imaging in 6 months to monitor

## 2020-04-02 NOTE — Assessment & Plan Note (Addendum)
-   S/p thoracentesis 03/14/20 with 427ml output, patient reported noticeable improvement in breathing afterwards. Repeat imaging showed small residual right pleural effusion

## 2020-04-02 NOTE — Assessment & Plan Note (Signed)
-   Continue Eliquis 5mg  twice daily - Repeat doppler LLE DVT resolved

## 2020-04-03 ENCOUNTER — Other Ambulatory Visit: Payer: Self-pay

## 2020-04-03 ENCOUNTER — Ambulatory Visit (INDEPENDENT_AMBULATORY_CARE_PROVIDER_SITE_OTHER): Payer: BC Managed Care – PPO | Admitting: Family Medicine

## 2020-04-03 VITALS — BP 120/74 | HR 93 | Resp 16 | Ht 73.0 in | Wt 244.0 lb

## 2020-04-03 DIAGNOSIS — J1282 Pneumonia due to coronavirus disease 2019: Secondary | ICD-10-CM

## 2020-04-03 DIAGNOSIS — J918 Pleural effusion in other conditions classified elsewhere: Secondary | ICD-10-CM

## 2020-04-03 DIAGNOSIS — R0600 Dyspnea, unspecified: Secondary | ICD-10-CM | POA: Diagnosis not present

## 2020-04-03 DIAGNOSIS — J869 Pyothorax without fistula: Secondary | ICD-10-CM | POA: Diagnosis not present

## 2020-04-03 DIAGNOSIS — U071 COVID-19: Secondary | ICD-10-CM

## 2020-04-03 DIAGNOSIS — Z8616 Personal history of COVID-19: Secondary | ICD-10-CM

## 2020-04-03 DIAGNOSIS — J189 Pneumonia, unspecified organism: Secondary | ICD-10-CM

## 2020-04-03 DIAGNOSIS — Z23 Encounter for immunization: Secondary | ICD-10-CM

## 2020-04-03 NOTE — Progress Notes (Signed)
Subjective:    Patient ID: Ryan Turner, male    DOB: 11-04-1950, 69 y.o.   MRN: [id number]  HPI  Patient is a very pleasant 69 year old Caucasian male here today for a follow-up from his hospital discharge.    Admit date: 03/14/2020 Discharge date: 03/22/2020  Admitted From: home Discharge disposition: home   Code Status: DNR  Diet Recommendation: regular diet  Discharge Diagnosis:   Principal Problem:   Empyema of right pleural space ([place]) Active Problems:   Hypertension   Pneumonia due to COVID-19 virus   DVT (deep venous thrombosis) ([place])   Pressure injury of skin  History of Present Illness / Brief narrative:  Ryan Turner an 69 y.o.male with recent history of Covid pneumonia, diagnosed 02/14/2020, hospitalized 02/20/2020 to 03/03/2020.  Discharge on supplemental oxygen on steroids.  His hospital course was also complicated by acute DVT of his left lower extremity and was started on Eliquis.  Post discharge, he was gradually improving but started having worsening shortness of breath and right-sided pleuritic pain.Marland Kitchen He was seen by his pulmonologist as an outpatient on 8/24 and subsequently sent to the ED. Chest x-ray showed right pleural effusion. He underwent a right thoracentesis in IR 03/14/2020 by Dr. Thornton Papas yielding 400 mL.  He was transferred and admitted to [facility] for further management. See below for details.  Subjective:  Seen and examined this am.  Not in distress. Chest tube out yesterday.  Wants to go home.  Hospital Course:  Loculated empyema of right pleural space -Chest x-ray showed right pleural effusion. He underwent a right thoracentesis in IR 03/14/2020 by Dr. Thornton Papas yielding 400 mL.  -Subsequent CTA chest revealed large right pleural effusion despite recent thoracentesis.   -Cardiothoracic surgery for the right chest tube and administered TPA. Patient clinically improved. -Chest tube removed on 8/31. Chest x-ray shows interval  improvement. -Cultures grew MSSA continue antibiotics forfor 2 weeks as an outpatient. -on Keflex 500 mg q8 hrs. Plan for 2 more weeks to complete a total of 3 weeks.   Acute on chronic respiratory failure with hypoxia  -secondary to COVID.  -On 2lpm O2 at rest and 4lpm on ambulation.   Recent left lower extremity DVT: -Eliquis to resume at home.  History of a recent pneumonia due to COVID-19 virus Discharged on 3 to 6 L of oxygen, continue steroid taper to finish as planned.  Pressure injury - buttocks stage 2.  -seen by wound care as below.  03/24/20 Patient is here today for evaluation.  He states that he feels much better than his last visit.  Chest tube was removed.  I evaluated the wound.  The wound is well-healed.  There is a small purple bruise on his right flank.  There is no surrounding erythema.  There is no crepitus under the skin.  There is no induration.  Patient denies any chest pain or pleurisy.  He is still short of breath however he is at his baseline since discharge with Covid.  He does have petechia and purpura all over his legs.  He states that the petechiae or on his legs prior to his discharge from the hospital.  He did have heparin in the hospital.  His platelet count on discharge was normal.  Since discharge, he states the petechiae have not worsened although he has had one episode of blood in his urine.  He denies any epistaxis or bleeding gums or conjunctival hemorrhages.  He denies headache or neck stiffness.  He is taking Keflex.  At that time, my plan was: Empyema has clinically resolved.  However given the diminished breath sounds in the right base I am concerned about a pneumothorax.  I will obtain a chest x-ray to evaluate the size and as long as it is small, it would likely resorb on its own.  There was a small amount of air in the pleural space on CT scan August 29.  Patient denies any fevers or signs of systemic illness.  The petechia on his legs apparently  are chronic from his hospitalization.  I will check a CBC to rule out heparin-induced thrombocytopenia.  Patient appears clinically well and therefore I feel the DIC is unlikely.  Assuming platelet counts are normal, most likely due to his anticoagulant/Eliquis.  Dyspnea is due to Covid pneumonia.  He is still requiring oxygen.  Patient will gradually taper off prednisone and has a follow-up appointment with his pulmonologist next week.  Recheck CBC and CMP today.  Determine follow-up appointment based on lab results and chest x-ray  04/03/20 Office Visit on 03/30/2020  Component Date Value Ref Range Status  . Sodium 03/30/2020 139  135 - 145 mEq/L Final  . Potassium 03/30/2020 4.5  3.5 - 5.1 mEq/L Final  . Chloride 03/30/2020 100  96 - 112 mEq/L Final  . CO2 03/30/2020 32  19 - 32 mEq/L Final  . Glucose, Bld 03/30/2020 101* 70 - 99 mg/dL Final  . BUN 03/30/2020 15  6 - 23 mg/dL Final  . Creatinine, Ser 03/30/2020 0.73  0.40 - 1.50 mg/dL Final  . GFR 03/30/2020 105.75  >60.00 mL/min Final  . Calcium 03/30/2020 9.3  8.4 - 10.5 mg/dL Final  Office Visit on 03/24/2020  Component Date Value Ref Range Status  . WBC 03/24/2020 8.1  3.8 - 10.8 Thousand/uL Final  . RBC 03/24/2020 4.27  4.20 - 5.80 Million/uL Final  . Hemoglobin 03/24/2020 13.6  13.2 - 17.1 g/dL Final  . HCT 03/24/2020 39.7  38 - 50 % Final  . MCV 03/24/2020 93.0  80.0 - 100.0 fL Final  . MCH 03/24/2020 31.9  27.0 - 33.0 pg Final  . MCHC 03/24/2020 34.3  32.0 - 36.0 g/dL Final  . RDW 03/24/2020 12.8  11.0 - 15.0 % Final  . Platelets 03/24/2020 357  140 - 400 Thousand/uL Final  . MPV 03/24/2020 9.6  7.5 - 12.5 fL Final  . Neutro Abs 03/24/2020 6,755  1,500 - 7,800 cells/uL Final  . Lymphs Abs 03/24/2020 883  850 - 3,900 cells/uL Final  . Absolute Monocytes 03/24/2020 421  200 - 950 cells/uL Final  . Eosinophils Absolute 03/24/2020 0* 15 - 500 cells/uL Final  . Basophils Absolute 03/24/2020 41  0 - 200 cells/uL Final  .  Neutrophils Relative % 03/24/2020 83.4  % Final  . Total Lymphocyte 03/24/2020 10.9  % Final  . Monocytes Relative 03/24/2020 5.2  % Final  . Eosinophils Relative 03/24/2020 0.0  % Final  . Basophils Relative 03/24/2020 0.5  % Final  . Smear Review 03/24/2020    Final   Comment: Review of peripheral smear confirms automated results.   . Glucose, Bld 03/24/2020 120* 65 - 99 mg/dL Final   Comment: .            Fasting reference interval . For someone without known diabetes, a glucose value between 100 and 125 mg/dL is consistent with prediabetes and should be confirmed with a follow-up test. .   . BUN 03/24/2020 20  7 - 25  mg/dL Final  . Creat 03/24/2020 0.65* 0.70 - 1.18 mg/dL Final   Comment: For patients >55 years of age, the reference limit for Creatinine is approximately 13% higher for people identified as African-American. .   . GFR, Est Non African American 03/24/2020 98  > OR = 60 mL/min/1.47m2 Final  . GFR, Est African American 03/24/2020 113  > OR = 60 mL/min/1.78m2 Final  . BUN/Creatinine Ratio 03/24/2020 31* 6 - 22 (calc) Final  . Sodium 03/24/2020 138  135 - 146 mmol/L Final  . Potassium 03/24/2020 4.9  3.5 - 5.3 mmol/L Final  . Chloride 03/24/2020 103  98 - 110 mmol/L Final  . CO2 03/24/2020 26  20 - 32 mmol/L Final  . Calcium 03/24/2020 8.8  8.6 - 10.3 mg/dL Final  . Total Protein 03/24/2020 6.2  6.1 - 8.1 g/dL Final  . Albumin 03/24/2020 3.3* 3.6 - 5.1 g/dL Final  . Globulin 03/24/2020 2.9  1.9 - 3.7 g/dL (calc) Final  . AG Ratio 03/24/2020 1.1  1.0 - 2.5 (calc) Final  . Total Bilirubin 03/24/2020 0.3  0.2 - 1.2 mg/dL Final  . Alkaline phosphatase (APISO) 03/24/2020 157* 35 - 144 U/L Final  . AST 03/24/2020 28  10 - 35 U/L Final  . ALT 03/24/2020 61* 9 - 46 U/L Final  . INR 03/24/2020 1.0   Final   Comment: Reference Range                     0.9-1.1 Moderate-intensity Warfarin Therapy 2.0-3.0 Higher-intensity Warfarin Therapy   3.0-4.0  .   Marland Kitchen Prothrombin  Time 03/24/2020 10.5  9.0 - 11.5 sec Final   Comment: For additional information, please refer to http://education.[facility].com/faq/FAQ104 (This link is being provided for informational/ educational purposes only.)   . aPTT 03/24/2020 29  23 - 32 sec Final   Comment: . This test has not been validated for monitoring unfractionated heparin therapy. For testing that is validated for this type of therapy, please refer to the Heparin Anti-Xa assay (test code [phone]). . For additional information, please refer to http://education.[facility].com/faq/FAQ159 (This link is being provided for  informational/educational purposes only.)   No results displayed because visit has over 200 results.    Patient's lungs sound much better today.  The crackles in his right base and the decreased breath sounds have improved.  Patient denies any chest pain.  He does have some dyspnea on exertion however that improves rapidly with rest.  I reviewed his chest x-ray from last week which showed a right-sided pleural effusion that was small.  Patient is 96% on 1 L today.  I turned his oxygen down to 1 L and talk with him for 15 minutes.  His oxygen maintained steady at 96% at rest.  With activity it drops rapidly however however this is much improved from 2 weeks ago.  Overall he is doing much better.  He is about to finish his prednisone today  Past Medical History:  Diagnosis Date  . Cataract 2014   removed  . COVID-19   . DDD (degenerative disc disease), lumbar   . Diverticulosis   . Headache   . Hypertension   . IBS (irritable bowel syndrome)    Past Surgical History:  Procedure Laterality Date  . cataracts    . COLON SURGERY  1995   fistula  . COLONOSCOPY    . EYE SURGERY  2014   catarats/lens replaced  . 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 POLYPECTOMY    . VASECTOMY  1985   Current Outpatient Medications on File Prior to Visit  Medication Sig Dispense Refill  . acetaminophen  (TYLENOL) 325 MG tablet Take 2 tablets (650 mg total) by mouth every 6 (six) hours as needed for mild pain or headache (fever >/= 101). (Patient not taking: Reported on 03/30/2020)    . [START ON 04/08/2020] apixaban (ELIQUIS) 5 MG TABS tablet Take 1 tablet (5 mg total) by mouth 2 (two) times daily. 60 tablet 1  . celecoxib (CELEBREX) 200 MG capsule TAKE 1 CAPSULE (200 MG TOTAL) BY MOUTH AS NEEDED. (Patient not taking: Reported on 03/24/2020) 90 capsule 2  . cephALEXin (KEFLEX) 500 MG capsule Take 1 capsule (500 mg total) by mouth every 8 (eight) hours for 14 days. 42 capsule 0  . docusate sodium (COLACE) 100 MG capsule Take 200 mg by mouth daily as needed for mild constipation or moderate constipation.     . Glucosamine-Chondroit-Vit C-Mn (GLUCOSAMINE CHONDROITIN COMPLX) CAPS Take 2 capsules by mouth daily. 1800 mg    . Multiple Vitamins-Minerals (OCUVITE PRESERVISION PO) Take 2 tablets by mouth daily.     . Omega-3 Fatty Acids (FISH OIL) 1200 MG CAPS Take 2 capsules by mouth daily.    . predniSONE (DELTASONE) 20 MG tablet Take 20 mg by mouth daily with breakfast. 1 tablet daily for 7 days    . saccharomyces boulardii (FLORASTOR) 250 MG capsule Take 1 capsule (250 mg total) by mouth 2 (two) times daily for 14 days. 28 capsule 0  . zolpidem (AMBIEN) 10 MG tablet Take 0.5-1 tablets (5-10 mg total) by mouth at bedtime as needed. for sleep 30 tablet 3   No current facility-administered medications on file prior to visit.   Allergies  Allergen Reactions  . Morphine     Other reaction(s): VOMITING   Social History   Socioeconomic History  . Marital status: Married    Spouse name: Not on file  . Number of children: Not on file  . Years of education: College  . Highest education level: Not on file  Occupational History  . Occupation: Teacher, music  Tobacco Use  . Smoking status: Former Smoker    Types: Cigars    Quit date: 01/28/2020    Years since quitting: 0.1  . Smokeless tobacco: Never Used  .  Tobacco comment: quit cigarettes in 1973  Vaping Use  . Vaping Use: Never used  Substance and Sexual Activity  . Alcohol use: Yes    Alcohol/week: 6.0 standard drinks    Types: 2 Cans of beer, 4 Shots of liquor per week  . Drug use: No  . Sexual activity: Yes    Birth control/protection: None  Other Topics Concern  . Not on file  Social History Narrative   Lives at home with wife.   Social Determinants of Health   Financial Resource Strain:   . Difficulty of Paying Living Expenses: Not on file  Food Insecurity:   . Worried About Charity fundraiser in the Last Year: Not on file  . Ran Out of Food in the Last Year: Not on file  Transportation Needs:   . Lack of Transportation (Medical): Not on file  . Lack of Transportation (Non-Medical): Not on file  Physical Activity:   . Days of Exercise per Week: Not on file  . Minutes of Exercise per Session: Not on file  Stress:   . Feeling of Stress : Not on file  Social  Connections:   . Frequency of Communication with Friends and Family: Not on file  . Frequency of Social Gatherings with Friends and Family: Not on file  . Attends Religious Services: Not on file  . Active Member of Clubs or Organizations: Not on file  . Attends Archivist Meetings: Not on file  . Marital Status: Not on file  Intimate Partner Violence:   . Fear of Current or Ex-Partner: Not on file  . Emotionally Abused: Not on file  . Physically Abused: Not on file  . Sexually Abused: Not on file      Review of Systems  All other systems reviewed and are negative.      Objective:   Physical Exam Vitals reviewed.  Constitutional:      General: He is not in acute distress.    Appearance: Normal appearance. He is normal weight. He is not ill-appearing or toxic-appearing.  HENT:     Right Ear: Tympanic membrane and ear canal normal.     Left Ear: Tympanic membrane and ear canal normal.     Nose: Nose normal. No congestion or rhinorrhea.      Mouth/Throat:     Pharynx: No oropharyngeal exudate or posterior oropharyngeal erythema.  Eyes:     General:        Right eye: No discharge.        Left eye: No discharge.     Conjunctiva/sclera: Conjunctivae normal.  Cardiovascular:     Rate and Rhythm: Normal rate and regular rhythm.     Pulses: Normal pulses.     Heart sounds: Normal heart sounds. No murmur heard.  No friction rub.  Pulmonary:     Effort: No respiratory distress.     Breath sounds: Examination of the right-middle field reveals decreased breath sounds. Examination of the right-lower field reveals decreased breath sounds. Decreased breath sounds present. No wheezing, rhonchi or rales.    Abdominal:     General: There is no distension.     Palpations: Abdomen is soft.     Tenderness: There is no abdominal tenderness. There is no guarding.  Musculoskeletal:     Cervical back: Neck supple.  Lymphadenopathy:     Cervical: No cervical adenopathy.  Skin:    Findings: Bruising, petechiae and rash present.       Neurological:     General: No focal deficit present.     Mental Status: He is alert and oriented to person, place, and time.     Cranial Nerves: No cranial nerve deficit.           Assessment & Plan:  Need for immunization against influenza - Plan: CANCELED: Flu Vaccine QUAD 36+ mos IM  Pneumonia due to COVID-19 virus  Empyema of right pleural space (HCC)  Pleural effusion associated with pulmonary infection  Dyspnea, unspecified type  Patient will be off prednisone next week.  I recommended that he return next week to receive his flu shot.  His empyema clinically has resolved.  He does have a small right-sided pleural effusion that appears stable.  Clinically he is doing much better and is tolerating 1 L at rest.  I recommended that he use oxygen 1 L at rest and increased to 3 L with activity.  I would like to see the patient back in 1 month.  At that time I think he will be able to stop his  oxygen most likely.  I would repeat a chest x-ray in 1 month to monitor  the right-sided pleural effusion.  Otherwise patient seems to be doing much better

## 2020-04-04 DIAGNOSIS — E669 Obesity, unspecified: Secondary | ICD-10-CM | POA: Diagnosis not present

## 2020-04-04 DIAGNOSIS — K589 Irritable bowel syndrome without diarrhea: Secondary | ICD-10-CM | POA: Diagnosis not present

## 2020-04-04 DIAGNOSIS — M519 Unspecified thoracic, thoracolumbar and lumbosacral intervertebral disc disorder: Secondary | ICD-10-CM | POA: Diagnosis not present

## 2020-04-04 DIAGNOSIS — Z7901 Long term (current) use of anticoagulants: Secondary | ICD-10-CM | POA: Diagnosis not present

## 2020-04-04 DIAGNOSIS — Z6831 Body mass index (BMI) 31.0-31.9, adult: Secondary | ICD-10-CM | POA: Diagnosis not present

## 2020-04-04 DIAGNOSIS — Z791 Long term (current) use of non-steroidal anti-inflammatories (NSAID): Secondary | ICD-10-CM | POA: Diagnosis not present

## 2020-04-04 DIAGNOSIS — J9601 Acute respiratory failure with hypoxia: Secondary | ICD-10-CM | POA: Diagnosis not present

## 2020-04-04 DIAGNOSIS — K5790 Diverticulosis of intestine, part unspecified, without perforation or abscess without bleeding: Secondary | ICD-10-CM | POA: Diagnosis not present

## 2020-04-04 DIAGNOSIS — I82402 Acute embolism and thrombosis of unspecified deep veins of left lower extremity: Secondary | ICD-10-CM | POA: Diagnosis not present

## 2020-04-04 DIAGNOSIS — U071 COVID-19: Secondary | ICD-10-CM | POA: Diagnosis not present

## 2020-04-04 DIAGNOSIS — I10 Essential (primary) hypertension: Secondary | ICD-10-CM | POA: Diagnosis not present

## 2020-04-04 DIAGNOSIS — Z9181 History of falling: Secondary | ICD-10-CM | POA: Diagnosis not present

## 2020-04-04 DIAGNOSIS — J1282 Pneumonia due to coronavirus disease 2019: Secondary | ICD-10-CM | POA: Diagnosis not present

## 2020-04-04 DIAGNOSIS — D696 Thrombocytopenia, unspecified: Secondary | ICD-10-CM | POA: Diagnosis not present

## 2020-04-04 DIAGNOSIS — F17210 Nicotine dependence, cigarettes, uncomplicated: Secondary | ICD-10-CM | POA: Diagnosis not present

## 2020-04-05 DIAGNOSIS — M5136 Other intervertebral disc degeneration, lumbar region: Secondary | ICD-10-CM | POA: Diagnosis not present

## 2020-04-05 DIAGNOSIS — M545 Low back pain: Secondary | ICD-10-CM | POA: Diagnosis not present

## 2020-04-05 DIAGNOSIS — M62830 Muscle spasm of back: Secondary | ICD-10-CM | POA: Diagnosis not present

## 2020-04-05 DIAGNOSIS — M9903 Segmental and somatic dysfunction of lumbar region: Secondary | ICD-10-CM | POA: Diagnosis not present

## 2020-04-06 ENCOUNTER — Encounter: Payer: Self-pay | Admitting: Family Medicine

## 2020-04-06 DIAGNOSIS — M519 Unspecified thoracic, thoracolumbar and lumbosacral intervertebral disc disorder: Secondary | ICD-10-CM | POA: Diagnosis not present

## 2020-04-06 DIAGNOSIS — Z9181 History of falling: Secondary | ICD-10-CM | POA: Diagnosis not present

## 2020-04-06 DIAGNOSIS — J869 Pyothorax without fistula: Secondary | ICD-10-CM | POA: Insufficient documentation

## 2020-04-06 DIAGNOSIS — I82402 Acute embolism and thrombosis of unspecified deep veins of left lower extremity: Secondary | ICD-10-CM | POA: Diagnosis not present

## 2020-04-06 DIAGNOSIS — U071 COVID-19: Secondary | ICD-10-CM | POA: Diagnosis not present

## 2020-04-06 DIAGNOSIS — Z6831 Body mass index (BMI) 31.0-31.9, adult: Secondary | ICD-10-CM | POA: Diagnosis not present

## 2020-04-06 DIAGNOSIS — E669 Obesity, unspecified: Secondary | ICD-10-CM | POA: Diagnosis not present

## 2020-04-06 DIAGNOSIS — J9601 Acute respiratory failure with hypoxia: Secondary | ICD-10-CM | POA: Diagnosis not present

## 2020-04-06 DIAGNOSIS — D696 Thrombocytopenia, unspecified: Secondary | ICD-10-CM | POA: Diagnosis not present

## 2020-04-06 DIAGNOSIS — Z7901 Long term (current) use of anticoagulants: Secondary | ICD-10-CM | POA: Diagnosis not present

## 2020-04-06 DIAGNOSIS — F17210 Nicotine dependence, cigarettes, uncomplicated: Secondary | ICD-10-CM | POA: Diagnosis not present

## 2020-04-06 DIAGNOSIS — I10 Essential (primary) hypertension: Secondary | ICD-10-CM | POA: Diagnosis not present

## 2020-04-06 DIAGNOSIS — K5790 Diverticulosis of intestine, part unspecified, without perforation or abscess without bleeding: Secondary | ICD-10-CM | POA: Diagnosis not present

## 2020-04-06 DIAGNOSIS — K589 Irritable bowel syndrome without diarrhea: Secondary | ICD-10-CM | POA: Diagnosis not present

## 2020-04-06 DIAGNOSIS — Z791 Long term (current) use of non-steroidal anti-inflammatories (NSAID): Secondary | ICD-10-CM | POA: Diagnosis not present

## 2020-04-06 DIAGNOSIS — J1282 Pneumonia due to coronavirus disease 2019: Secondary | ICD-10-CM | POA: Diagnosis not present

## 2020-04-07 DIAGNOSIS — L89899 Pressure ulcer of other site, unspecified stage: Secondary | ICD-10-CM | POA: Diagnosis not present

## 2020-04-07 DIAGNOSIS — L821 Other seborrheic keratosis: Secondary | ICD-10-CM | POA: Diagnosis not present

## 2020-04-07 DIAGNOSIS — L905 Scar conditions and fibrosis of skin: Secondary | ICD-10-CM | POA: Diagnosis not present

## 2020-04-07 DIAGNOSIS — D1801 Hemangioma of skin and subcutaneous tissue: Secondary | ICD-10-CM | POA: Diagnosis not present

## 2020-04-10 ENCOUNTER — Institutional Professional Consult (permissible substitution): Payer: BC Managed Care – PPO | Admitting: Pulmonary Disease

## 2020-04-10 DIAGNOSIS — M545 Low back pain: Secondary | ICD-10-CM | POA: Diagnosis not present

## 2020-04-10 DIAGNOSIS — M5136 Other intervertebral disc degeneration, lumbar region: Secondary | ICD-10-CM | POA: Diagnosis not present

## 2020-04-10 DIAGNOSIS — M9903 Segmental and somatic dysfunction of lumbar region: Secondary | ICD-10-CM | POA: Diagnosis not present

## 2020-04-10 DIAGNOSIS — M62830 Muscle spasm of back: Secondary | ICD-10-CM | POA: Diagnosis not present

## 2020-04-11 DIAGNOSIS — F17210 Nicotine dependence, cigarettes, uncomplicated: Secondary | ICD-10-CM | POA: Diagnosis not present

## 2020-04-11 DIAGNOSIS — U071 COVID-19: Secondary | ICD-10-CM | POA: Diagnosis not present

## 2020-04-11 DIAGNOSIS — K5790 Diverticulosis of intestine, part unspecified, without perforation or abscess without bleeding: Secondary | ICD-10-CM | POA: Diagnosis not present

## 2020-04-11 DIAGNOSIS — J1282 Pneumonia due to coronavirus disease 2019: Secondary | ICD-10-CM | POA: Diagnosis not present

## 2020-04-11 DIAGNOSIS — Z791 Long term (current) use of non-steroidal anti-inflammatories (NSAID): Secondary | ICD-10-CM | POA: Diagnosis not present

## 2020-04-11 DIAGNOSIS — J9601 Acute respiratory failure with hypoxia: Secondary | ICD-10-CM | POA: Diagnosis not present

## 2020-04-11 DIAGNOSIS — I82402 Acute embolism and thrombosis of unspecified deep veins of left lower extremity: Secondary | ICD-10-CM | POA: Diagnosis not present

## 2020-04-11 DIAGNOSIS — Z6831 Body mass index (BMI) 31.0-31.9, adult: Secondary | ICD-10-CM | POA: Diagnosis not present

## 2020-04-11 DIAGNOSIS — K589 Irritable bowel syndrome without diarrhea: Secondary | ICD-10-CM | POA: Diagnosis not present

## 2020-04-11 DIAGNOSIS — Z9181 History of falling: Secondary | ICD-10-CM | POA: Diagnosis not present

## 2020-04-11 DIAGNOSIS — D696 Thrombocytopenia, unspecified: Secondary | ICD-10-CM | POA: Diagnosis not present

## 2020-04-11 DIAGNOSIS — M519 Unspecified thoracic, thoracolumbar and lumbosacral intervertebral disc disorder: Secondary | ICD-10-CM | POA: Diagnosis not present

## 2020-04-11 DIAGNOSIS — Z7901 Long term (current) use of anticoagulants: Secondary | ICD-10-CM | POA: Diagnosis not present

## 2020-04-11 DIAGNOSIS — E669 Obesity, unspecified: Secondary | ICD-10-CM | POA: Diagnosis not present

## 2020-04-11 DIAGNOSIS — I10 Essential (primary) hypertension: Secondary | ICD-10-CM | POA: Diagnosis not present

## 2020-04-12 ENCOUNTER — Ambulatory Visit (INDEPENDENT_AMBULATORY_CARE_PROVIDER_SITE_OTHER): Payer: BC Managed Care – PPO

## 2020-04-12 ENCOUNTER — Other Ambulatory Visit: Payer: Self-pay

## 2020-04-12 ENCOUNTER — Telehealth: Payer: Self-pay | Admitting: Family Medicine

## 2020-04-12 DIAGNOSIS — Z23 Encounter for immunization: Secondary | ICD-10-CM

## 2020-04-12 NOTE — Telephone Encounter (Signed)
CB# Pt would like to know if Dr.Pickard sign also fax over his [employer] forms.

## 2020-04-13 DIAGNOSIS — M545 Low back pain: Secondary | ICD-10-CM | POA: Diagnosis not present

## 2020-04-13 DIAGNOSIS — M5136 Other intervertebral disc degeneration, lumbar region: Secondary | ICD-10-CM | POA: Diagnosis not present

## 2020-04-13 DIAGNOSIS — M62830 Muscle spasm of back: Secondary | ICD-10-CM | POA: Diagnosis not present

## 2020-04-13 DIAGNOSIS — M9903 Segmental and somatic dysfunction of lumbar region: Secondary | ICD-10-CM | POA: Diagnosis not present

## 2020-04-16 ENCOUNTER — Encounter: Payer: Self-pay | Admitting: Family Medicine

## 2020-04-17 DIAGNOSIS — M62830 Muscle spasm of back: Secondary | ICD-10-CM | POA: Diagnosis not present

## 2020-04-17 DIAGNOSIS — M545 Low back pain: Secondary | ICD-10-CM | POA: Diagnosis not present

## 2020-04-17 DIAGNOSIS — M5136 Other intervertebral disc degeneration, lumbar region: Secondary | ICD-10-CM | POA: Diagnosis not present

## 2020-04-17 DIAGNOSIS — M9903 Segmental and somatic dysfunction of lumbar region: Secondary | ICD-10-CM | POA: Diagnosis not present

## 2020-04-18 DIAGNOSIS — K589 Irritable bowel syndrome without diarrhea: Secondary | ICD-10-CM | POA: Diagnosis not present

## 2020-04-18 DIAGNOSIS — F17210 Nicotine dependence, cigarettes, uncomplicated: Secondary | ICD-10-CM | POA: Diagnosis not present

## 2020-04-18 DIAGNOSIS — Z791 Long term (current) use of non-steroidal anti-inflammatories (NSAID): Secondary | ICD-10-CM | POA: Diagnosis not present

## 2020-04-18 DIAGNOSIS — U071 COVID-19: Secondary | ICD-10-CM | POA: Diagnosis not present

## 2020-04-18 DIAGNOSIS — M519 Unspecified thoracic, thoracolumbar and lumbosacral intervertebral disc disorder: Secondary | ICD-10-CM | POA: Diagnosis not present

## 2020-04-18 DIAGNOSIS — Z6831 Body mass index (BMI) 31.0-31.9, adult: Secondary | ICD-10-CM | POA: Diagnosis not present

## 2020-04-18 DIAGNOSIS — K5790 Diverticulosis of intestine, part unspecified, without perforation or abscess without bleeding: Secondary | ICD-10-CM | POA: Diagnosis not present

## 2020-04-18 DIAGNOSIS — D696 Thrombocytopenia, unspecified: Secondary | ICD-10-CM | POA: Diagnosis not present

## 2020-04-18 DIAGNOSIS — Z9181 History of falling: Secondary | ICD-10-CM | POA: Diagnosis not present

## 2020-04-18 DIAGNOSIS — Z7901 Long term (current) use of anticoagulants: Secondary | ICD-10-CM | POA: Diagnosis not present

## 2020-04-18 DIAGNOSIS — I82402 Acute embolism and thrombosis of unspecified deep veins of left lower extremity: Secondary | ICD-10-CM | POA: Diagnosis not present

## 2020-04-18 DIAGNOSIS — E669 Obesity, unspecified: Secondary | ICD-10-CM | POA: Diagnosis not present

## 2020-04-18 DIAGNOSIS — J9601 Acute respiratory failure with hypoxia: Secondary | ICD-10-CM | POA: Diagnosis not present

## 2020-04-18 DIAGNOSIS — J1282 Pneumonia due to coronavirus disease 2019: Secondary | ICD-10-CM | POA: Diagnosis not present

## 2020-04-18 DIAGNOSIS — I10 Essential (primary) hypertension: Secondary | ICD-10-CM | POA: Diagnosis not present

## 2020-04-20 DIAGNOSIS — M9904 Segmental and somatic dysfunction of sacral region: Secondary | ICD-10-CM | POA: Diagnosis not present

## 2020-04-20 DIAGNOSIS — M62830 Muscle spasm of back: Secondary | ICD-10-CM | POA: Diagnosis not present

## 2020-04-20 DIAGNOSIS — M9903 Segmental and somatic dysfunction of lumbar region: Secondary | ICD-10-CM | POA: Diagnosis not present

## 2020-04-20 DIAGNOSIS — M5136 Other intervertebral disc degeneration, lumbar region: Secondary | ICD-10-CM | POA: Diagnosis not present

## 2020-04-26 DIAGNOSIS — M9904 Segmental and somatic dysfunction of sacral region: Secondary | ICD-10-CM | POA: Diagnosis not present

## 2020-04-26 DIAGNOSIS — M62830 Muscle spasm of back: Secondary | ICD-10-CM | POA: Diagnosis not present

## 2020-04-26 DIAGNOSIS — M9903 Segmental and somatic dysfunction of lumbar region: Secondary | ICD-10-CM | POA: Diagnosis not present

## 2020-04-26 DIAGNOSIS — M5136 Other intervertebral disc degeneration, lumbar region: Secondary | ICD-10-CM | POA: Diagnosis not present

## 2020-05-01 ENCOUNTER — Institutional Professional Consult (permissible substitution): Payer: BC Managed Care – PPO | Admitting: Internal Medicine

## 2020-05-03 DIAGNOSIS — U071 COVID-19: Secondary | ICD-10-CM | POA: Diagnosis not present

## 2020-05-04 ENCOUNTER — Other Ambulatory Visit: Payer: Self-pay

## 2020-05-04 ENCOUNTER — Ambulatory Visit (INDEPENDENT_AMBULATORY_CARE_PROVIDER_SITE_OTHER): Payer: BC Managed Care – PPO | Admitting: Family Medicine

## 2020-05-04 VITALS — BP 120/78 | HR 94 | Temp 97.8°F | Ht 73.0 in | Wt 250.0 lb

## 2020-05-04 DIAGNOSIS — M9903 Segmental and somatic dysfunction of lumbar region: Secondary | ICD-10-CM | POA: Diagnosis not present

## 2020-05-04 DIAGNOSIS — M9904 Segmental and somatic dysfunction of sacral region: Secondary | ICD-10-CM | POA: Diagnosis not present

## 2020-05-04 DIAGNOSIS — R0600 Dyspnea, unspecified: Secondary | ICD-10-CM

## 2020-05-04 DIAGNOSIS — J918 Pleural effusion in other conditions classified elsewhere: Secondary | ICD-10-CM

## 2020-05-04 DIAGNOSIS — Z8669 Personal history of other diseases of the nervous system and sense organs: Secondary | ICD-10-CM

## 2020-05-04 DIAGNOSIS — J1282 Pneumonia due to coronavirus disease 2019: Secondary | ICD-10-CM | POA: Diagnosis not present

## 2020-05-04 DIAGNOSIS — J869 Pyothorax without fistula: Secondary | ICD-10-CM | POA: Diagnosis not present

## 2020-05-04 DIAGNOSIS — M5136 Other intervertebral disc degeneration, lumbar region: Secondary | ICD-10-CM | POA: Diagnosis not present

## 2020-05-04 DIAGNOSIS — J189 Pneumonia, unspecified organism: Secondary | ICD-10-CM

## 2020-05-04 DIAGNOSIS — U071 COVID-19: Secondary | ICD-10-CM

## 2020-05-04 DIAGNOSIS — M62830 Muscle spasm of back: Secondary | ICD-10-CM | POA: Diagnosis not present

## 2020-05-04 MED ORDER — APIXABAN 5 MG PO TABS: 5.0000 mg | ORAL_TABLET | Freq: Two times a day (BID) | ORAL | 2 refills | Status: DC

## 2020-05-04 MED ORDER — HYOSCYAMINE SULFATE SL 0.125 MG SL SUBL: 1.0000 | SUBLINGUAL_TABLET | Freq: Four times a day (QID) | SUBLINGUAL | 1 refills | Status: DC | PRN

## 2020-05-04 NOTE — Progress Notes (Signed)
Subjective:    Patient ID: Ryan Turner, male    DOB: 11-04-1950, 69 y.o.   MRN: [id number]  HPI  Patient is a very pleasant 69 year old Caucasian male here today for a follow-up from his hospital discharge.    Admit date: 03/14/2020 Discharge date: 03/22/2020  Admitted From: home Discharge disposition: home   Code Status: DNR  Diet Recommendation: regular diet  Discharge Diagnosis:   Principal Problem:   Empyema of right pleural space ([place]) Active Problems:   Hypertension   Pneumonia due to COVID-19 virus   DVT (deep venous thrombosis) ([place])   Pressure injury of skin  History of Present Illness / Brief narrative:  Sebastin Perlmutter an 69 y.o.male with recent history of Covid pneumonia, diagnosed 02/14/2020, hospitalized 02/20/2020 to 03/03/2020.  Discharge on supplemental oxygen on steroids.  His hospital course was also complicated by acute DVT of his left lower extremity and was started on Eliquis.  Post discharge, he was gradually improving but started having worsening shortness of breath and right-sided pleuritic pain.Marland Kitchen He was seen by his pulmonologist as an outpatient on 8/24 and subsequently sent to the ED. Chest x-ray showed right pleural effusion. He underwent a right thoracentesis in IR 03/14/2020 by Dr. Thornton Papas yielding 400 mL.  He was transferred and admitted to [facility] for further management. See below for details.  Subjective:  Seen and examined this am.  Not in distress. Chest tube out yesterday.  Wants to go home.  Hospital Course:  Loculated empyema of right pleural space -Chest x-ray showed right pleural effusion. He underwent a right thoracentesis in IR 03/14/2020 by Dr. Thornton Papas yielding 400 mL.  -Subsequent CTA chest revealed large right pleural effusion despite recent thoracentesis.   -Cardiothoracic surgery for the right chest tube and administered TPA. Patient clinically improved. -Chest tube removed on 8/31. Chest x-ray shows interval  improvement. -Cultures grew MSSA continue antibiotics forfor 2 weeks as an outpatient. -on Keflex 500 mg q8 hrs. Plan for 2 more weeks to complete a total of 3 weeks.   Acute on chronic respiratory failure with hypoxia  -secondary to COVID.  -On 2lpm O2 at rest and 4lpm on ambulation.   Recent left lower extremity DVT: -Eliquis to resume at home.  History of a recent pneumonia due to COVID-19 virus Discharged on 3 to 6 L of oxygen, continue steroid taper to finish as planned.  Pressure injury - buttocks stage 2.  -seen by wound care as below.  03/24/20 Patient is here today for evaluation.  He states that he feels much better than his last visit.  Chest tube was removed.  I evaluated the wound.  The wound is well-healed.  There is a small purple bruise on his right flank.  There is no surrounding erythema.  There is no crepitus under the skin.  There is no induration.  Patient denies any chest pain or pleurisy.  He is still short of breath however he is at his baseline since discharge with Covid.  He does have petechia and purpura all over his legs.  He states that the petechiae or on his legs prior to his discharge from the hospital.  He did have heparin in the hospital.  His platelet count on discharge was normal.  Since discharge, he states the petechiae have not worsened although he has had one episode of blood in his urine.  He denies any epistaxis or bleeding gums or conjunctival hemorrhages.  He denies headache or neck stiffness.  He is taking Keflex.  At that time, my plan was: Empyema has clinically resolved.  However given the diminished breath sounds in the right base I am concerned about a pneumothorax.  I will obtain a chest x-ray to evaluate the size and as long as it is small, it would likely resorb on its own.  There was a small amount of air in the pleural space on CT scan August 29.  Patient denies any fevers or signs of systemic illness.  The petechia on his legs apparently  are chronic from his hospitalization.  I will check a CBC to rule out heparin-induced thrombocytopenia.  Patient appears clinically well and therefore I feel the DIC is unlikely.  Assuming platelet counts are normal, most likely due to his anticoagulant/Eliquis.  Dyspnea is due to Covid pneumonia.  He is still requiring oxygen.  Patient will gradually taper off prednisone and has a follow-up appointment with his pulmonologist next week.  Recheck CBC and CMP today.  Determine follow-up appointment based on lab results and chest x-ray  04/03/20 No visits with results within 2 Week(s) from this visit.  Latest known visit with results is:  Office Visit on 03/30/2020  Component Date Value Ref Range Status  . Sodium 03/30/2020 139  135 - 145 mEq/L Final  . Potassium 03/30/2020 4.5  3.5 - 5.1 mEq/L Final  . Chloride 03/30/2020 100  96 - 112 mEq/L Final  . CO2 03/30/2020 32  19 - 32 mEq/L Final  . Glucose, Bld 03/30/2020 101* 70 - 99 mg/dL Final  . BUN 03/30/2020 15  6 - 23 mg/dL Final  . Creatinine, Ser 03/30/2020 0.73  0.40 - 1.50 mg/dL Final  . GFR 03/30/2020 105.75  >60.00 mL/min Final  . Calcium 03/30/2020 9.3  8.4 - 10.5 mg/dL Final  Patient's lungs sound much better today.  The crackles in his right base and the decreased breath sounds have improved.  Patient denies any chest pain.  He does have some dyspnea on exertion however that improves rapidly with rest.  I reviewed his chest x-ray from last week which showed a right-sided pleural effusion that was small.  Patient is 96% on 1 L today.  I turned his oxygen down to 1 L and talk with him for 15 minutes.  His oxygen maintained steady at 96% at rest.  With activity it drops rapidly however however this is much improved from 2 weeks ago.  Overall he is doing much better.  He is about to finish his prednisone today   05/04/20 Patient looks much better today.  He looks much stronger.  He is completely off his oxygen.  He states that he does not even  need the oxygen.  However if he walks vigorously for 5 times back to his mailbox into his home his oxygen will drop to 88%.  He will start feeling like he is short of breath.  However if he takes 3 or 4 deep breaths this will quickly correct.  Other than that he is back to his baseline.  He is back to work.  Overall he feels well.  He denies any chest pain or pleurisy or hemoptysis or fevers or chills.  Pulmonary exam today is significant for diminished breath sounds in the right posterior base consistent with his previous pleural effusion however otherwise, his lungs sound completely clear. Past Medical History:  Diagnosis Date  . Cataract 2014   removed  . COVID-19   . DDD (degenerative disc disease), lumbar   . Diverticulosis   .  Empyema lung ([place])   . Headache   . Hypertension   . IBS (irritable bowel syndrome)    Past Surgical History:  Procedure Laterality Date  . cataracts    . COLON SURGERY  1995   fistula  . COLONOSCOPY    . EYE SURGERY  2014   catarats/lens replaced  . mohs  2015   skin cancer from nose  . POLYPECTOMY    . VASECTOMY  1985   Current Outpatient Medications on File Prior to Visit  Medication Sig Dispense Refill  . apixaban (ELIQUIS) 5 MG TABS tablet Take 1 tablet (5 mg total) by mouth 2 (two) times daily. 60 tablet 1  . docusate sodium (COLACE) 100 MG capsule Take 200 mg by mouth daily as needed for mild constipation or moderate constipation.     . Glucosamine-Chondroit-Vit C-Mn (GLUCOSAMINE CHONDROITIN COMPLX) CAPS Take 2 capsules by mouth daily. 1800 mg    . Multiple Vitamins-Minerals (OCUVITE PRESERVISION PO) Take 2 tablets by mouth daily.     . Omega-3 Fatty Acids (FISH OIL) 1200 MG CAPS Take 2 capsules by mouth daily.    Marland Kitchen zolpidem (AMBIEN) 10 MG tablet Take 0.5-1 tablets (5-10 mg total) by mouth at bedtime as needed. for sleep 30 tablet 3  . acetaminophen (TYLENOL) 325 MG tablet Take 2 tablets (650 mg total) by mouth every 6 (six) hours as needed for  mild pain or headache (fever >/= 101). (Patient not taking: Reported on 04/03/2020)    . celecoxib (CELEBREX) 200 MG capsule TAKE 1 CAPSULE (200 MG TOTAL) BY MOUTH AS NEEDED. (Patient not taking: Reported on 04/03/2020) 90 capsule 2   No current facility-administered medications on file prior to visit.   Allergies  Allergen Reactions  . Morphine     Other reaction(s): VOMITING   Social History   Socioeconomic History  . Marital status: Married    Spouse name: Not on file  . Number of children: Not on file  . Years of education: College  . Highest education level: Not on file  Occupational History  . Occupation: Teacher, music  Tobacco Use  . Smoking status: Former Smoker    Types: Cigars    Quit date: 01/28/2020    Years since quitting: 0.2  . Smokeless tobacco: Never Used  . Tobacco comment: quit cigarettes in 1973  Vaping Use  . Vaping Use: Never used  Substance and Sexual Activity  . Alcohol use: Yes    Alcohol/week: 6.0 standard drinks    Types: 2 Cans of beer, 4 Shots of liquor per week  . Drug use: No  . Sexual activity: Yes    Birth control/protection: None  Other Topics Concern  . Not on file  Social History Narrative   Lives at home with wife.   Social Determinants of Health   Financial Resource Strain:   . Difficulty of Paying Living Expenses: Not on file  Food Insecurity:   . Worried About Charity fundraiser in the Last Year: Not on file  . Ran Out of Food in the Last Year: Not on file  Transportation Needs:   . Lack of Transportation (Medical): Not on file  . Lack of Transportation (Non-Medical): Not on file  Physical Activity:   . Days of Exercise per Week: Not on file  . Minutes of Exercise per Session: Not on file  Stress:   . Feeling of Stress : Not on file  Social Connections:   . Frequency of Communication with Friends and Family:  Not on file  . Frequency of Social Gatherings with Friends and Family: Not on file  . Attends Religious Services: Not on  file  . Active Member of Clubs or Organizations: Not on file  . Attends Archivist Meetings: Not on file  . Marital Status: Not on file  Intimate Partner Violence:   . Fear of Current or Ex-Partner: Not on file  . Emotionally Abused: Not on file  . Physically Abused: Not on file  . Sexually Abused: Not on file      Review of Systems  All other systems reviewed and are negative.      Objective:   Physical Exam Vitals reviewed.  Constitutional:      General: He is not in acute distress.    Appearance: Normal appearance. He is normal weight. He is not ill-appearing or toxic-appearing.  HENT:     Right Ear: Tympanic membrane and ear canal normal.     Left Ear: Tympanic membrane and ear canal normal.     Nose: Nose normal. No congestion or rhinorrhea.     Mouth/Throat:     Pharynx: No oropharyngeal exudate or posterior oropharyngeal erythema.  Eyes:     General:        Right eye: No discharge.        Left eye: No discharge.     Conjunctiva/sclera: Conjunctivae normal.  Cardiovascular:     Rate and Rhythm: Normal rate and regular rhythm.     Pulses: Normal pulses.     Heart sounds: Normal heart sounds. No murmur heard.  No friction rub.  Pulmonary:     Effort: No respiratory distress.     Breath sounds: Examination of the right-lower field reveals decreased breath sounds. Decreased breath sounds present. No wheezing, rhonchi or rales.    Abdominal:     General: There is no distension.     Palpations: Abdomen is soft.     Tenderness: There is no abdominal tenderness. There is no guarding.  Musculoskeletal:     Cervical back: Neck supple.  Lymphadenopathy:     Cervical: No cervical adenopathy.  Skin:    Findings: No bruising, petechiae or rash.  Neurological:     General: No focal deficit present.     Mental Status: He is alert and oriented to person, place, and time.     Cranial Nerves: No cranial nerve deficit.           Assessment & Plan:   Pneumonia due to COVID-19 virus - Plan: DG Chest 2 View  Empyema of right pleural space ([place]) - Plan: DG Chest 2 View  Pleural effusion associated with pulmonary infection - Plan: DG Chest 2 View  Dyspnea, unspecified type - Plan: DG Chest 2 View  History of sleep apnea - Plan: Ambulatory referral to Sleep Studies  Patient continues to have transient hypoxia with vigorous activity but is otherwise back to his baseline.  Patient does have some residual decreased breath sounds in the right base consistent with a persistent right pleural effusion.  The only question is whether he would benefit from thoracentesis to improve his lung capacity and then potentially pleurodesis to prevent recurrence.  He has an appointment to see his lung specialist on Friday.  Therefore I will defer chest x-ray to assess the size of the pleural effusion to that visit and allow his pulmonologist to make the final decision.  Otherwise patient requires just tincture of time.  I will refer the patient to a  sleep specialist for sleep study.  He has a history of [place] treated sleep apnea and therefore I do believe treating this with help improve his overall cardiopulmonary performance long-term.  He is due for a physical exam however really the only pending work-up that he has not had at this point would be a PSA.  I recommended he return simply for a PSA after January

## 2020-05-11 DIAGNOSIS — M62830 Muscle spasm of back: Secondary | ICD-10-CM | POA: Diagnosis not present

## 2020-05-11 DIAGNOSIS — M9904 Segmental and somatic dysfunction of sacral region: Secondary | ICD-10-CM | POA: Diagnosis not present

## 2020-05-11 DIAGNOSIS — M9903 Segmental and somatic dysfunction of lumbar region: Secondary | ICD-10-CM | POA: Diagnosis not present

## 2020-05-11 DIAGNOSIS — M5136 Other intervertebral disc degeneration, lumbar region: Secondary | ICD-10-CM | POA: Diagnosis not present

## 2020-05-12 ENCOUNTER — Encounter: Payer: Self-pay | Admitting: Internal Medicine

## 2020-05-12 ENCOUNTER — Other Ambulatory Visit: Payer: Self-pay

## 2020-05-12 ENCOUNTER — Ambulatory Visit (INDEPENDENT_AMBULATORY_CARE_PROVIDER_SITE_OTHER): Payer: BC Managed Care – PPO | Admitting: Internal Medicine

## 2020-05-12 ENCOUNTER — Other Ambulatory Visit (HOSPITAL_COMMUNITY)
Admission: RE | Admit: 2020-05-12 | Discharge: 2020-05-12 | Disposition: A | Discharge status: Home / Self Care | Payer: BC Managed Care – PPO | Source: Ambulatory Visit | Attending: Internal Medicine | Admitting: Internal Medicine

## 2020-05-12 ENCOUNTER — Ambulatory Visit (HOSPITAL_COMMUNITY)
Admission: RE | Admit: 2020-05-12 | Discharge: 2020-05-12 | Disposition: A | Discharge status: Home / Self Care | Payer: BC Managed Care – PPO | Source: Ambulatory Visit | Attending: Internal Medicine | Admitting: Internal Medicine

## 2020-05-12 DIAGNOSIS — R600 Localized edema: Secondary | ICD-10-CM | POA: Insufficient documentation

## 2020-05-12 DIAGNOSIS — Z0184 Encounter for antibody response examination: Secondary | ICD-10-CM | POA: Diagnosis not present

## 2020-05-12 DIAGNOSIS — U071 COVID-19: Secondary | ICD-10-CM | POA: Diagnosis not present

## 2020-05-12 DIAGNOSIS — I10 Essential (primary) hypertension: Secondary | ICD-10-CM | POA: Diagnosis not present

## 2020-05-12 DIAGNOSIS — J189 Pneumonia, unspecified organism: Secondary | ICD-10-CM | POA: Diagnosis not present

## 2020-05-12 DIAGNOSIS — R0602 Shortness of breath: Secondary | ICD-10-CM | POA: Diagnosis not present

## 2020-05-12 DIAGNOSIS — J869 Pyothorax without fistula: Secondary | ICD-10-CM

## 2020-05-12 DIAGNOSIS — J9811 Atelectasis: Secondary | ICD-10-CM | POA: Diagnosis not present

## 2020-05-12 DIAGNOSIS — R609 Edema, unspecified: Secondary | ICD-10-CM | POA: Insufficient documentation

## 2020-05-12 DIAGNOSIS — J90 Pleural effusion, not elsewhere classified: Secondary | ICD-10-CM | POA: Diagnosis not present

## 2020-05-12 DIAGNOSIS — R0600 Dyspnea, unspecified: Secondary | ICD-10-CM | POA: Diagnosis not present

## 2020-05-12 LAB — HEPATIC FUNCTION PANEL
ALT: 17 U/L (ref 0–44)
AST: 19 U/L (ref 15–41)
Albumin: 4 g/dL (ref 3.5–5.0)
Alkaline Phosphatase: 46 U/L (ref 38–126)
Bilirubin, Direct: 0.1 mg/dL (ref 0.0–0.2)
Indirect Bilirubin: 0.8 mg/dL (ref 0.3–0.9)
Total Bilirubin: 0.9 mg/dL (ref 0.3–1.2)
Total Protein: 6.4 g/dL — ABNORMAL LOW (ref 6.5–8.1)

## 2020-05-12 LAB — TSH: TSH: 2.408 u[IU]/mL (ref 0.350–4.500)

## 2020-05-12 LAB — SEDIMENTATION RATE: Sed Rate: 5 mm/hr (ref 0–16)

## 2020-05-12 NOTE — Progress Notes (Signed)
Ryan Turner, male    DOB: Nov 28, 1950,   MRN: [id number]   Brief patient profile:  69 yowm quit smoking 1973 admitted   Admit date: 03/14/2020 Discharge date: 03/22/2020  Admitted From: home Discharge disposition: home Diet Recommendation: regular diet  Discharge Diagnosis:   Principal Problem:   Empyema of right pleural space ([place])   Hypertension   Pneumonia due to COVID-19 virus   DVT (deep venous thrombosis) ([place])   Pressure injury of skin  History of Present Illness / Brief narrative:  Ryan Turner an 69 y.o.male with Covid pneumonia, diagnosed 02/14/2020, hospitalized 02/20/2020 to 03/03/2020.  Discharge on supplemental oxygen on steroids.  His hospital course was also complicated by acute DVT of his left lower extremity and was started on Eliquis.  Post discharge, he was gradually improving but started having worsening shortness of breath and right-sided pleuritic pain.Marland Kitchen He was seen by his pulmonologist as an outpatient on 8/24 and subsequently sent to the ED. Chest x-ray showed right pleural effusion. He underwent a right thoracentesis in IR 03/14/2020 by Dr. Thornton Papas yielding 400 mL.  He was transferred and admitted to [facility] for further management. See below for details.    Hospital Course:  Loculated empyema of right pleural space -Chest x-ray showed right pleural effusion. He underwent a right thoracentesis in IR 03/14/2020 by Dr. Thornton Papas yielding 400 mL.  -Subsequent CTA chest revealed large right pleural effusion despite recent thoracentesis.   -Cardiothoracic surgery for the right chest tube and administered TPA. Patient clinically improved. -Chest tube removed on 8/31. Chest x-ray shows interval improvement. -Cultures grew MSSA continue antibiotics forfor 2 weeks as an outpatient. -on Keflex 500 mg q8 hrs. Plan for 2 more weeks to complete a total of 3 weeks.   Acute on chronic respiratory failure with hypoxia  -secondary to COVID.  -On 2lpm O2 at rest and  4lpm on ambulation.   Recent left lower extremity DVT: -Eliquis to resume at home.  History of a recent pneumonia due to COVID-19 virus Discharged on 3 to 6 L of oxygen, continue steroid taper to finish as planned.  Pressure injury - buttocks stage 2.  -seen by wound care as below.       History of Present Illness  05/12/2020  Pulmonary/ 1st office eval/ Ryan Turner / Ryan Turner Office / post hosp f/u for MSSA empyema Chief Complaint  Patient presents with  . Follow-up    shortness of breath with exertion  Dyspnea:  400 ft driveway to mb  About 80% ex tol now vs pre admit = 110% Cough: rarely non productive  Sleep: bed is flat/ 2 pillows same as better SABA use: none 02 :  Stopped it 2 weeks prior to OV   / drops to 88% with exertion Still sensation of tightness R > L  with deep inspiration   No obvious day to day or daytime variability or assoc excess/ purulent sputum or mucus plugs or hemoptysis or  subjective wheeze or overt sinus or hb symptoms.   Sleeping as above without nocturnal  or early am exacerbation  of respiratory  c/o's or need for noct saba. Also denies any obvious fluctuation of symptoms with weather or environmental changes or other aggravating or alleviating factors except as outlined above   No unusual exposure hx or h/o childhood pna/ asthma or knowledge of premature birth.  Current Allergies, Complete Past Medical History, Past Surgical History, Family History, and Social History were reviewed in [employer] record.  ROS  The following are not active complaints unless bolded Hoarseness, sore throat, dysphagia, dental problems, itching, sneezing,  nasal congestion or discharge of excess mucus or purulent secretions, ear ache,   fever, chills, sweats, unintended wt loss or wt gain, classically   exertional cp,  orthopnea pnd or arm/hand swelling  or leg swelling, presyncope, palpitations, abdominal pain, anorexia, nausea, vomiting, diarrhea   or change in bowel habits or change in bladder habits, change in stools or change in urine, dysuria, hematuria,  rash, arthralgias, visual complaints, headache, numbness, weakness or ataxia or problems with walking or coordination,  change in mood or  memory.             Past Medical History:  Diagnosis Date  . Cataract 2014   removed  . COVID-19   . DDD (degenerative disc disease), lumbar   . Diverticulosis   . Empyema lung ([place])   . Headache   . Hypertension   . IBS (irritable bowel syndrome)     Outpatient Medications Prior to Visit  Medication Sig Dispense Refill  . acetaminophen (TYLENOL) 325 MG tablet Take 2 tablets (650 mg total) by mouth every 6 (six) hours as needed for mild pain or headache (fever >/= 101).    Marland Kitchen apixaban (ELIQUIS) 5 MG TABS tablet Take 1 tablet (5 mg total) by mouth 2 (two) times daily. 60 tablet 2  . docusate sodium (COLACE) 100 MG capsule Take 200 mg by mouth daily as needed for mild constipation or moderate constipation.     . Glucosamine-Chondroit-Vit C-Mn (GLUCOSAMINE CHONDROITIN COMPLX) CAPS Take 2 capsules by mouth daily. 1800 mg    . Hyoscyamine Sulfate SL (LEVSIN/SL) 0.125 MG SUBL Place 1 tablet under the tongue 4 (four) times daily as needed. 120 tablet 1  . Multiple Vitamins-Minerals (OCUVITE PRESERVISION PO) Take 2 tablets by mouth daily.     . Omega-3 Fatty Acids (FISH OIL) 1200 MG CAPS Take 2 capsules by mouth daily.    Marland Kitchen zolpidem (AMBIEN) 10 MG tablet Take 0.5-1 tablets (5-10 mg total) by mouth at bedtime as needed. for sleep 30 tablet 3  . celecoxib (CELEBREX) 200 MG capsule TAKE 1 CAPSULE (200 MG TOTAL) BY MOUTH AS NEEDED. (Patient not taking: Reported on 04/03/2020) 90 capsule 2   No facility-administered medications prior to visit.     Objective:     BP (!) 148/90 (BP Location: Left Arm, Cuff Size: Normal)   Pulse 89   Temp (!) 97 F (36.1 C) (Other (Comment)) Comment (Src): wrist  Ht '6\' 1"'  (1.854 m)   Wt 255 lb (115.7 kg)    SpO2 97% Comment: Room air  BMI 33.64 kg/m   SpO2: 97 % (Room air)   amb pleasant wm nad    HEENT : pt wearing mask not removed for exam due to covid -19 concerns.    NECK :  without JVD/Nodes/TM/ nl carotid upstrokes bilaterally   LUNGS: no acc muscle use,  Nl contour chest with minimial decreased bs/ dullness R base  without cough on insp or exp maneuvers   CV:  RRR  no s3 or murmur or increase in P2, and  Trace bilateral ankles pitting edema   ABD:  soft and nontender with nl inspiratory excursion in the supine position. No bruits or organomegaly appreciated, bowel sounds nl  MS:  Nl gait/ ext warm without deformities, calf tenderness, cyanosis or clubbing No obvious joint restrictions   SKIN: warm and dry without lesions    NEURO:  alert,  approp, nl sensorium with  no motor or cerebellar deficits apparent.    CXR PA and Lateral:   05/12/2020 :    I personally reviewed images and agree with radiology impression as follows: Improved RIGHT basilar aeration since previous study with decreased RIGHT pleural effusion and basilar atelectasis. Bronchitic changes with chronic perihilar and RIGHT upper lobe accentuated markings. No acute abnormalities.     LFT's  05/12/2020 ok  With alb 4.0    Lab Results  Component Value Date   TSH 1.800 05/15/2017     Lab Results  Component Value Date   ESRSEDRATE 5 05/12/2020   ESRSEDRATE 69 (H) 03/15/2020   ESRSEDRATE 2 09/22/2017       Assessment   Empyema lung ([place]) Chest  tube placed 03/15/20  and removed 03/21/20 p lytic rx - marked serial improvement aeration R base as of 05/12/2020 with esr now only 5 vs 69 at admit   Advised healing very nicely with less than anticipate scarring R base > f/u at 3 m for final eval/ cxr     Peripheral edema Venous dopplers Pos on L , neg on R on 02/22/20 and neg on 03/14/20 and now swelling is symmetric and very mild  No evidence of chf  R or L,  Albumin is nl / TSH pending but no  furhter w/u needed for now   Needs to complete 6 m of eliquis for confirmed prior L DVT / advised    Each maintenance medication was reviewed in detail including emphasizing most importantly the difference between maintenance and prns and under what circumstances the prns are to be triggered using an action plan format where appropriate.  Total time for H and P, chart review, counseling,  and generating customized AVS unique to this intial  office visit / charting = > 30 min         Christinia Gully, MD 05/12/2020

## 2020-05-12 NOTE — Patient Instructions (Signed)
To get the most out of exercise, you need to be continuously aware that you are short of breath, but never out of breath, for 30 minutes daily. As you improve, it will actually be easier for you to do the same amount of exercise  in  30 minutes so always push to the level where you are short of breath.     Make sure you check your oxygen saturations at highest level of activity to be sure it stays over 90% and keep track of it at least once a week, more often if breathing getting worse, and let me know if losing ground.   Please schedule a follow up office visit in 6 weeks, call sooner if needed

## 2020-05-12 NOTE — Assessment & Plan Note (Addendum)
Venous dopplers Pos on L , neg on R on 02/22/20 and neg on 03/14/20 and now swelling is symmetric and very mild   No evidence of chf  R or L,  Albumin is nl / TSH pending but no furhter w/u needed for now   Needs to complete 6 m of eliquis for confirmed prior L DVT / advised             Each maintenance medication was reviewed in detail including emphasizing most importantly the difference between maintenance and prns and under what circumstances the prns are to be triggered using an action plan format where appropriate.  Total time for H and P, chart review, counseling,  and generating customized AVS unique to this intial  office visit / charting = > 30 min

## 2020-05-12 NOTE — Assessment & Plan Note (Signed)
Chest  tube placed 03/15/20  and removed 03/21/20 p lytic rx - marked serial improvement aeration R base as of 05/12/2020 with esr now only 5 vs 69 at admit   Advised healing very nicely with less than anticipate scarring R base > f/u at 3 m for final eval/ cxr 

## 2020-05-13 LAB — SAR COV2 SEROLOGY (COVID19)AB(IGG),IA: SARS-CoV-2 Ab, IgG: REACTIVE — AB

## 2020-05-15 NOTE — Progress Notes (Signed)
Called and spoke with patient about xray and lab results per Dr Melvyn Novas. All questions answered and patient expressed understanding. Confirmed with patient upcoming scheduled appointment for 06/22/2020 with Dr Melvyn Novas. Nothing further needed at this time.

## 2020-05-25 DIAGNOSIS — M9903 Segmental and somatic dysfunction of lumbar region: Secondary | ICD-10-CM | POA: Diagnosis not present

## 2020-05-25 DIAGNOSIS — M62830 Muscle spasm of back: Secondary | ICD-10-CM | POA: Diagnosis not present

## 2020-05-25 DIAGNOSIS — M9904 Segmental and somatic dysfunction of sacral region: Secondary | ICD-10-CM | POA: Diagnosis not present

## 2020-05-25 DIAGNOSIS — M5136 Other intervertebral disc degeneration, lumbar region: Secondary | ICD-10-CM | POA: Diagnosis not present

## 2020-05-30 DIAGNOSIS — M9903 Segmental and somatic dysfunction of lumbar region: Secondary | ICD-10-CM | POA: Diagnosis not present

## 2020-05-30 DIAGNOSIS — M62830 Muscle spasm of back: Secondary | ICD-10-CM | POA: Diagnosis not present

## 2020-05-30 DIAGNOSIS — M9904 Segmental and somatic dysfunction of sacral region: Secondary | ICD-10-CM | POA: Diagnosis not present

## 2020-05-30 DIAGNOSIS — M5136 Other intervertebral disc degeneration, lumbar region: Secondary | ICD-10-CM | POA: Diagnosis not present

## 2020-05-31 ENCOUNTER — Other Ambulatory Visit: Payer: Self-pay | Admitting: Family Medicine

## 2020-06-01 DIAGNOSIS — G4700 Insomnia, unspecified: Secondary | ICD-10-CM | POA: Diagnosis not present

## 2020-06-01 DIAGNOSIS — E559 Vitamin D deficiency, unspecified: Secondary | ICD-10-CM | POA: Diagnosis not present

## 2020-06-01 DIAGNOSIS — E669 Obesity, unspecified: Secondary | ICD-10-CM | POA: Diagnosis not present

## 2020-06-01 DIAGNOSIS — R5383 Other fatigue: Secondary | ICD-10-CM | POA: Diagnosis not present

## 2020-06-01 DIAGNOSIS — K5900 Constipation, unspecified: Secondary | ICD-10-CM | POA: Diagnosis not present

## 2020-06-01 DIAGNOSIS — B379 Candidiasis, unspecified: Secondary | ICD-10-CM | POA: Diagnosis not present

## 2020-06-01 DIAGNOSIS — E538 Deficiency of other specified B group vitamins: Secondary | ICD-10-CM | POA: Diagnosis not present

## 2020-06-01 DIAGNOSIS — Z125 Encounter for screening for malignant neoplasm of prostate: Secondary | ICD-10-CM | POA: Diagnosis not present

## 2020-06-06 DIAGNOSIS — M5136 Other intervertebral disc degeneration, lumbar region: Secondary | ICD-10-CM | POA: Diagnosis not present

## 2020-06-06 DIAGNOSIS — M9903 Segmental and somatic dysfunction of lumbar region: Secondary | ICD-10-CM | POA: Diagnosis not present

## 2020-06-06 DIAGNOSIS — M9904 Segmental and somatic dysfunction of sacral region: Secondary | ICD-10-CM | POA: Diagnosis not present

## 2020-06-06 DIAGNOSIS — M62830 Muscle spasm of back: Secondary | ICD-10-CM | POA: Diagnosis not present

## 2020-06-13 ENCOUNTER — Other Ambulatory Visit: Payer: Self-pay | Admitting: Family Medicine

## 2020-06-13 DIAGNOSIS — M62830 Muscle spasm of back: Secondary | ICD-10-CM | POA: Diagnosis not present

## 2020-06-13 DIAGNOSIS — M9904 Segmental and somatic dysfunction of sacral region: Secondary | ICD-10-CM | POA: Diagnosis not present

## 2020-06-13 DIAGNOSIS — M5136 Other intervertebral disc degeneration, lumbar region: Secondary | ICD-10-CM | POA: Diagnosis not present

## 2020-06-13 DIAGNOSIS — M9903 Segmental and somatic dysfunction of lumbar region: Secondary | ICD-10-CM | POA: Diagnosis not present

## 2020-06-18 ENCOUNTER — Other Ambulatory Visit: Payer: Self-pay | Admitting: Family Medicine

## 2020-06-19 DIAGNOSIS — E538 Deficiency of other specified B group vitamins: Secondary | ICD-10-CM | POA: Diagnosis not present

## 2020-06-19 DIAGNOSIS — G4700 Insomnia, unspecified: Secondary | ICD-10-CM | POA: Diagnosis not present

## 2020-06-19 DIAGNOSIS — R5383 Other fatigue: Secondary | ICD-10-CM | POA: Diagnosis not present

## 2020-06-19 DIAGNOSIS — R7989 Other specified abnormal findings of blood chemistry: Secondary | ICD-10-CM | POA: Diagnosis not present

## 2020-06-19 NOTE — Telephone Encounter (Signed)
Ok to refill??  Last office visit 05/04/2020.  Last refill 11/04/2019, #3 refills.,

## 2020-06-20 DIAGNOSIS — M5136 Other intervertebral disc degeneration, lumbar region: Secondary | ICD-10-CM | POA: Diagnosis not present

## 2020-06-20 DIAGNOSIS — M9903 Segmental and somatic dysfunction of lumbar region: Secondary | ICD-10-CM | POA: Diagnosis not present

## 2020-06-20 DIAGNOSIS — M62830 Muscle spasm of back: Secondary | ICD-10-CM | POA: Diagnosis not present

## 2020-06-20 DIAGNOSIS — M9904 Segmental and somatic dysfunction of sacral region: Secondary | ICD-10-CM | POA: Diagnosis not present

## 2020-06-22 ENCOUNTER — Encounter: Payer: Self-pay | Admitting: Internal Medicine

## 2020-06-22 ENCOUNTER — Other Ambulatory Visit: Payer: Self-pay

## 2020-06-22 ENCOUNTER — Ambulatory Visit (INDEPENDENT_AMBULATORY_CARE_PROVIDER_SITE_OTHER): Payer: BC Managed Care – PPO | Admitting: Internal Medicine

## 2020-06-22 DIAGNOSIS — J869 Pyothorax without fistula: Secondary | ICD-10-CM | POA: Diagnosis not present

## 2020-06-22 DIAGNOSIS — I824Z9 Acute embolism and thrombosis of unspecified deep veins of unspecified distal lower extremity: Secondary | ICD-10-CM | POA: Diagnosis not present

## 2020-06-22 NOTE — Progress Notes (Signed)
Ryan Turner  male    DOB: 1951-07-22   MRN: [id number]   Brief patient profile:  44 yowm quit smoking 1973 admitted   Admit date: 03/14/2020 Discharge date: 03/22/2020  Admitted From: home Discharge disposition: home Diet Recommendation: regular diet  Discharge Diagnosis:   Principal Problem:   Empyema of right pleural space ([place])   Hypertension   Pneumonia due to COVID-19 virus   DVT (deep venous thrombosis) ([place])   Pressure injury of skin  History of Present Illness / Brief narrative:  Ryan Turner an 69 y.o.male with Covid pneumonia, diagnosed 02/14/2020, hospitalized 02/20/2020 to 03/03/2020.  Discharge on supplemental oxygen on steroids.  His hospital course was also complicated by acute DVT of his left lower extremity and was started on Eliquis.  Post discharge, he was gradually improving but started having worsening shortness of breath and right-sided pleuritic pain.Marland Kitchen He was seen by his pulmonologist as an outpatient on 8/24 and subsequently sent to the ED. Chest x-ray showed right pleural effusion. He underwent a right thoracentesis in IR 03/14/2020 by Dr. Thornton Turner yielding 400 mL.  He was transferred and admitted to [facility] for further management. See below for details.    Hospital Course:  Loculated empyema of right pleural space -Chest x-ray showed right pleural effusion. He underwent a right thoracentesis in IR 03/14/2020 by Dr. Thornton Turner yielding 400 mL.  -Subsequent CTA chest revealed large right pleural effusion despite recent thoracentesis.   -Cardiothoracic surgery for the right chest tube and administered TPA. Patient clinically improved. -Chest tube removed on 8/31. Chest x-ray shows interval improvement. -Cultures grew MSSA continue antibiotics forfor 2 weeks as an outpatient. -on Keflex 500 mg q8 hrs. Plan for 2 more weeks to complete a total of 3 weeks.   Acute on chronic respiratory failure with hypoxia  -secondary to COVID.  -On 2lpm O2 at rest and  4lpm on ambulation.   Recent left lower extremity DVT: -Eliquis to resume at home.  History of a recent pneumonia due to COVID-19 virus Discharged on 3 to 6 L of oxygen, continue steroid taper to finish as planned.  Pressure injury - buttocks stage 2.  -seen by wound care as below        History of Present Illness  05/12/2020  Pulmonary/ 1st office eval/ Ryan Turner / Ryan Turner Office / post hosp f/u for MSSA empyema Chief Complaint  Patient presents with  . Follow-up    shortness of breath with exertion  Dyspnea:  400 ft driveway to mb  About 80% ex tol now vs pre admit = 110% Cough: rarely non productive  Sleep: bed is flat/ 2 pillows same as better SABA use: none 02 :  Stopped it 2 weeks prior to OV   / drops to 88% with exertion Still sensation of tightness R > L  with deep inspiration     06/22/2020  f/u ov/Ryan Turner office/Ryan Turner re:  Empyema lung ([place]) Peripheral edema  Chief Complaint  Patient presents with  . Follow-up    Breathing is doing well. He has occ chest discomfort if he takes a very deep breath.   Dyspnea:  90% back to baseline Cough: None  Sleeping: bed is flat, 2 pillows fine  SABA use: no 02: no    No obvious day to day or daytime variability or assoc excess/ purulent sputum or mucus plugs or hemoptysis or cp or chest tightness, subjective wheeze or overt sinus or hb symptoms.   Sleeping  without nocturnal  or early am  exacerbation  of respiratory  c/o's or need for noct saba. Also denies any obvious fluctuation of symptoms with weather or environmental changes or other aggravating or alleviating factors except as outlined above   No unusual exposure hx or h/o childhood pna/ asthma or knowledge of premature birth.  Current Allergies, Complete Past Medical History, Past Surgical History, Family History, and Social History were reviewed in [employer] record.  ROS  The following are not active complaints unless  bolded Hoarseness, sore throat, dysphagia, dental problems, itching, sneezing,  nasal congestion or discharge of excess mucus or purulent secretions, ear ache,   fever, chills, sweats, unintended wt loss or wt gain, classically pleuritic or exertional cp,  orthopnea pnd or arm/hand swelling  or leg swelling improved, presyncope, palpitations, abdominal pain, anorexia, nausea, vomiting, diarrhea  or change in bowel habits or change in bladder habits, change in stools or change in urine, dysuria, hematuria,  rash, arthralgias, visual complaints, headache, numbness, weakness or ataxia or problems with walking or coordination,  change in mood or  memory.        Current Meds  Medication Sig  . acetaminophen (TYLENOL) 325 MG tablet Take 2 tablets (650 mg total) by mouth every 6 (six) hours as needed for mild pain or headache (fever >/= 101).  Ryan Turner THYROID 30 MG tablet Take 30 mg by mouth daily.  . celecoxib (CELEBREX) 200 MG capsule TAKE 1 CAPSULE (200 MG TOTAL) BY MOUTH AS NEEDED.  Marland Kitchen docusate sodium (COLACE) 100 MG capsule Take 200 mg by mouth daily as needed for mild constipation or moderate constipation.   . Glucosamine-Chondroit-Vit C-Mn (GLUCOSAMINE CHONDROITIN COMPLX) CAPS Take 2 capsules by mouth daily. 1800 mg  . hyoscyamine (LEVSIN SL) 0.125 MG SL tablet PLACE 1 TABLET UNDER THE TONGUE 4 (FOUR) TIMES DAILY AS NEEDED.  . Multiple Vitamins-Minerals (OCUVITE PRESERVISION PO) Take 2 tablets by mouth daily.   . Omega-3 Fatty Acids (FISH OIL) 1200 MG CAPS Take 2 capsules by mouth daily.  Marland Kitchen UNABLE TO FIND Med Name: Amphotesacin (compounded med)- 25 mg cap 1 daily  . zolpidem (AMBIEN) 10 MG tablet TAKE 0.5-1 TABLETS (5-10 MG TOTAL) BY MOUTH AT BEDTIME AS NEEDED. FOR SLEEP            Past Medical History:  Diagnosis Date  . Cataract 2014   removed  . COVID-19   . DDD (degenerative disc disease), lumbar   . Diverticulosis   . Empyema lung ([place])   . Headache   . Hypertension   . IBS  (irritable bowel syndrome)        Objective:      Wt Readings from Last 3 Encounters:  06/22/20 255 lb (115.7 kg)  05/12/20 255 lb (115.7 kg)  05/04/20 250 lb (113.4 kg)     Vital signs reviewed - Note on arrival 06/22/2020  02 sats  97% on RA  amb healthy appearing wm nad   HEENT : pt wearing mask not removed for exam due to covid -19 concerns.    NECK :  without JVD/Nodes/TM/ nl carotid upstrokes bilaterally   LUNGS: no acc muscle use,  Nl contour chest which is clear to A and P bilaterally without cough on insp or exp maneuvers   CV:  RRR  no s3 or murmur or increase in P2, and trace ankle  edema bilaterally   ABD:  soft and nontender with nl inspiratory excursion in the supine position. No bruits or organomegaly appreciated, bowel sounds nl  MS:  Nl gait/ ext warm without deformities, calf tenderness, cyanosis or clubbing No obvious joint restrictions   SKIN: warm and dry without lesions    NEURO:  alert, approp, nl sensorium with  no motor or cerebellar deficits apparent.               Assessment

## 2020-06-22 NOTE — Patient Instructions (Signed)
Discuss stopping your eliquis with Dr Dennard Schaumann    Pulmonary follow up is as needed

## 2020-06-23 ENCOUNTER — Encounter: Payer: Self-pay | Admitting: Internal Medicine

## 2020-06-23 NOTE — Assessment & Plan Note (Signed)
Chest  tube placed 03/15/20  and removed 03/21/20 p lytic rx - marked serial improvement aeration R base as of 05/12/2020 with esr down to   5 vs 69 at admit   Clinically and radiographically with satisfactory improvement, nothing else needed

## 2020-06-23 NOTE — Assessment & Plan Note (Signed)
Dx 02/22/20 L side only > repeat on 03/14/20 showed resolution on eliquis - 06/22/2020 ok to stop eliquis p 6 m rx  (rx per Dr Cletus Gash)   He has minimal residual sym ankle edema and no risk factors for recurrence.  Since this was provoked by Covid/ recumbency there is no need for further w/u or longterm rx, though advised to monitor symptoms for recurrence as he will have a higher risk for recurrence having had one in the past  Discussed in detail all the  indications, usual  risks and alternatives  relative to the benefits with patient who agrees to proceed with Rx as outlined.            Each maintenance medication was reviewed in detail including emphasizing most importantly the difference between maintenance and prns and under what circumstances the prns are to be triggered using an action plan format where appropriate.      Total time for H and P, chart review, counseling, teaching device and generating customized AVS unique to this office visit / charting > 30 min

## 2020-06-27 DIAGNOSIS — M9903 Segmental and somatic dysfunction of lumbar region: Secondary | ICD-10-CM | POA: Diagnosis not present

## 2020-06-27 DIAGNOSIS — M5136 Other intervertebral disc degeneration, lumbar region: Secondary | ICD-10-CM | POA: Diagnosis not present

## 2020-06-27 DIAGNOSIS — M62830 Muscle spasm of back: Secondary | ICD-10-CM | POA: Diagnosis not present

## 2020-06-27 DIAGNOSIS — M9904 Segmental and somatic dysfunction of sacral region: Secondary | ICD-10-CM | POA: Diagnosis not present

## 2020-07-04 DIAGNOSIS — M5136 Other intervertebral disc degeneration, lumbar region: Secondary | ICD-10-CM | POA: Diagnosis not present

## 2020-07-04 DIAGNOSIS — M62830 Muscle spasm of back: Secondary | ICD-10-CM | POA: Diagnosis not present

## 2020-07-04 DIAGNOSIS — M9903 Segmental and somatic dysfunction of lumbar region: Secondary | ICD-10-CM | POA: Diagnosis not present

## 2020-07-04 DIAGNOSIS — M9904 Segmental and somatic dysfunction of sacral region: Secondary | ICD-10-CM | POA: Diagnosis not present

## 2020-07-06 ENCOUNTER — Encounter: Payer: Self-pay | Admitting: Family Medicine

## 2020-07-06 ENCOUNTER — Ambulatory Visit (INDEPENDENT_AMBULATORY_CARE_PROVIDER_SITE_OTHER): Payer: BC Managed Care – PPO | Admitting: Family Medicine

## 2020-07-06 ENCOUNTER — Other Ambulatory Visit: Payer: Self-pay

## 2020-07-06 VITALS — BP 140/80 | HR 87 | Temp 98.1°F | Ht 73.0 in | Wt 260.0 lb

## 2020-07-06 DIAGNOSIS — I824Z9 Acute embolism and thrombosis of unspecified deep veins of unspecified distal lower extremity: Secondary | ICD-10-CM

## 2020-07-06 MED ORDER — CELECOXIB 200 MG PO CAPS
200.0000 mg | ORAL_CAPSULE | ORAL | 2 refills | Status: DC | PRN
Start: 2020-07-06 — End: 2021-10-16

## 2020-07-06 MED ORDER — LEVOCETIRIZINE DIHYDROCHLORIDE 5 MG PO TABS: 5.0000 mg | ORAL_TABLET | Freq: Every evening | ORAL | 0 refills | Status: DC

## 2020-07-06 NOTE — Progress Notes (Signed)
Subjective:    Patient ID: Ryan Turner, male    DOB: 11-04-1950, 69 y.o.   MRN: [id number]  HPI  Patient is a very pleasant 69 year old Caucasian male here today for a follow-up from his hospital discharge.    Admit date: 03/14/2020 Discharge date: 03/22/2020  Admitted From: home Discharge disposition: home   Code Status: DNR  Diet Recommendation: regular diet  Discharge Diagnosis:   Principal Problem:   Empyema of right pleural space ([place]) Active Problems:   Hypertension   Pneumonia due to COVID-19 virus   DVT (deep venous thrombosis) ([place])   Pressure injury of skin  History of Present Illness / Brief narrative:  Ryan Turner an 69 y.o.male with recent history of Covid pneumonia, diagnosed 02/14/2020, hospitalized 02/20/2020 to 03/03/2020.  Discharge on supplemental oxygen on steroids.  His hospital course was also complicated by acute DVT of his left lower extremity and was started on Eliquis.  Post discharge, he was gradually improving but started having worsening shortness of breath and right-sided pleuritic pain.Marland Kitchen He was seen by his pulmonologist as an outpatient on 8/24 and subsequently sent to the ED. Chest x-ray showed right pleural effusion. He underwent a right thoracentesis in IR 03/14/2020 by Dr. Thornton Papas yielding 400 mL.  He was transferred and admitted to [facility] for further management. See below for details.  Subjective:  Seen and examined this am.  Not in distress. Chest tube out yesterday.  Wants to go home.  Hospital Course:  Loculated empyema of right pleural space -Chest x-ray showed right pleural effusion. He underwent a right thoracentesis in IR 03/14/2020 by Dr. Thornton Papas yielding 400 mL.  -Subsequent CTA chest revealed large right pleural effusion despite recent thoracentesis.   -Cardiothoracic surgery for the right chest tube and administered TPA. Patient clinically improved. -Chest tube removed on 8/31. Chest x-ray shows interval  improvement. -Cultures grew MSSA continue antibiotics forfor 2 weeks as an outpatient. -on Keflex 500 mg q8 hrs. Plan for 2 more weeks to complete a total of 3 weeks.   Acute on chronic respiratory failure with hypoxia  -secondary to COVID.  -On 2lpm O2 at rest and 4lpm on ambulation.   Recent left lower extremity DVT: -Eliquis to resume at home.  History of a recent pneumonia due to COVID-19 virus Discharged on 3 to 6 L of oxygen, continue steroid taper to finish as planned.  Pressure injury - buttocks stage 2.  -seen by wound care as below.  03/24/20 Patient is here today for evaluation.  He states that he feels much better than his last visit.  Chest tube was removed.  I evaluated the wound.  The wound is well-healed.  There is a small purple bruise on his right flank.  There is no surrounding erythema.  There is no crepitus under the skin.  There is no induration.  Patient denies any chest pain or pleurisy.  He is still short of breath however he is at his baseline since discharge with Covid.  He does have petechia and purpura all over his legs.  He states that the petechiae or on his legs prior to his discharge from the hospital.  He did have heparin in the hospital.  His platelet count on discharge was normal.  Since discharge, he states the petechiae have not worsened although he has had one episode of blood in his urine.  He denies any epistaxis or bleeding gums or conjunctival hemorrhages.  He denies headache or neck stiffness.  He is taking Keflex.  At that time, my plan was: Empyema has clinically resolved.  However given the diminished breath sounds in the right base I am concerned about a pneumothorax.  I will obtain a chest x-ray to evaluate the size and as long as it is small, it would likely resorb on its own.  There was a small amount of air in the pleural space on CT scan August 29.  Patient denies any fevers or signs of systemic illness.  The petechia on his legs apparently  are chronic from his hospitalization.  I will check a CBC to rule out heparin-induced thrombocytopenia.  Patient appears clinically well and therefore I feel the DIC is unlikely.  Assuming platelet counts are normal, most likely due to his anticoagulant/Eliquis.  Dyspnea is due to Covid pneumonia.  He is still requiring oxygen.  Patient will gradually taper off prednisone and has a follow-up appointment with his pulmonologist next week.  Recheck CBC and CMP today.  Determine follow-up appointment based on lab results and chest x-ray  04/03/20 No visits with results within 2 Week(s) from this visit.  Latest known visit with results is:  Hospital Outpatient Visit on 05/12/2020  Component Date Value Ref Range Status  . Sed Rate 05/12/2020 5  0 - 16 mm/hr Final   Performed at [facility], [address], [place], [place] [address]  . TSH 05/12/2020 2.408  0.350 - 4.500 uIU/mL Final   Comment: Performed by a 3rd Generation assay with a functional sensitivity of <=0.01 uIU/mL. Performed at [facility], [place] [address], [place], [place] [address]   . Total Protein 05/12/2020 6.4* 6.5 - 8.1 g/dL Final  . Albumin 05/12/2020 4.0  3.5 - 5.0 g/dL Final  . AST 05/12/2020 19  15 - 41 U/L Final  . ALT 05/12/2020 17  0 - 44 U/L Final  . Alkaline Phosphatase 05/12/2020 46  38 - 126 U/L Final  . Total Bilirubin 05/12/2020 0.9  0.3 - 1.2 mg/dL Final  . Bilirubin, Direct 05/12/2020 0.1  0.0 - 0.2 mg/dL Final  . Indirect Bilirubin 05/12/2020 0.8  0.3 - 0.9 mg/dL Final   Performed at [facility], [address] [place], [place] [address]  . SARS-CoV-2 Ab, IgG 05/12/2020 Reactive* NON REACTIVE Final   Comment: HEALTH DEPARTMENT NOTIFIED (NOTE) Reactive for SARS-CoV-2 IgG Antibodies. SARS-CoV-2 IgG antibodies detected.    Results suggest recent or prior infection with SARS-CoV-2.  Correlation with epidemiologic risk factors and other clinical and laboratory findings is recommended.  Serologic results  should not be  used to diagnose or exclude recent SARS-CoV-2 infection.  If acute infection is suspected, direct testing for SARS-CoV-2 is necessary.   Protective immunity cannot be inferred based on these results alone.   False positive results may occur due to past or present infection with other, non-SARS-CoV-2 coronavirus strains, such as coronavirus HKU1, NL63, OC43 or 229E.     The expected result is Non-Reactive.   Fact Sheet for Recipients:  LimitBuy.nl   Fact Sheet for Healthcare Providers:  WordAgents.no   Testing was performed using the [employer] SARS-CoV-2 IgG assay.  This test is not yet approved o                          r cleared by the [employer] and has been authorized by FDA under an Emergency Use Authorization (EUA).  This EUA will remain in effect (meaning this test can be used) for the  duration of the COVID-19 declaration under Section 564(b)(1) of the Act, 21 U.S.C. section 360bbb-3(b)(1), unless the authorization is terminated or revoked sooner.  Performed at [facility], [place] [address], [place], [place] [address]   Patient's lungs sound much better today.  The crackles in his right base and the decreased breath sounds have improved.  Patient denies any chest pain.  He does have some dyspnea on exertion however that improves rapidly with rest.  I reviewed his chest x-ray from last week which showed a right-sided pleural effusion that was small.  Patient is 96% on 1 L today.  I turned his oxygen down to 1 L and talk with him for 15 minutes.  His oxygen maintained steady at 96% at rest.  With activity it drops rapidly however however this is much improved from 2 weeks ago.  Overall he is doing much better.  He is about to finish his prednisone today   05/04/20 Patient looks much better today.  He looks much stronger.  He is completely off his oxygen.  He states that he does not even need  the oxygen.  However if he walks vigorously for 5 times back to his mailbox into his home his oxygen will drop to 88%.  He will start feeling like he is short of breath.  However if he takes 3 or 4 deep breaths this will quickly correct.  Other than that he is back to his baseline.  He is back to work.  Overall he feels well.  He denies any chest pain or pleurisy or hemoptysis or fevers or chills.  Pulmonary exam today is significant for diminished breath sounds in the right posterior base consistent with his previous pleural effusion however otherwise, his lungs sound completely clear.  At that time, my plan was: Patient continues to have transient hypoxia with vigorous activity but is otherwise back to his baseline.  Patient does have some residual decreased breath sounds in the right base consistent with a persistent right pleural effusion.  The only question is whether he would benefit from thoracentesis to improve his lung capacity and then potentially pleurodesis to prevent recurrence.  He has an appointment to see his lung specialist on Friday.  Therefore I will defer chest x-ray to assess the size of the pleural effusion to that visit and allow his pulmonologist to make the final decision.  Otherwise patient requires just tincture of time.  I will refer the patient to a sleep specialist for sleep study.  He has a history of [place] treated sleep apnea and therefore I do believe treating this with help improve his overall cardiopulmonary performance long-term.  He is due for a physical exam however really the only pending work-up that he has not had at this point would be a PSA.  I recommended he return simply for a PSA after January  07/06/20 Patient is here today for follow-up. Since I last saw him he continues to improve. He states that he is now back out playing golf. He is able to play a full round of golf and aside from some mild pain in his back does well with continued energy. He denies any dyspnea  on exertion or cough or chest pain or pleurisy. He has been released by his pulmonologist. He questions if he can stop the Eliquis. He had a DVT diagnosed in early August. He is completed 4 months of therapy. We discussed the risk versus benefits of 3 months versus 6 months. My  initial concern about 6 months was due to the hypercoagulable state of Covid however at this point I feel that is safe for him to stop the Eliquis. Patient would like to stop the Eliquis so he can go ahead and start to take the Celebrex again. Therefore, together we have decided to go ahead and discontinue Eliquis. He is seeing a holistic doctor now who is recently started him on Armour Thyroid. However his TSH was completely normal in October at 2.4. Therefore not sure that the Armour Thyroid would be beneficial to him. He also has some lab work obtained at the holistic doctor which included a normal CBC, a normal CMP, a hemoglobin A1c of 5.2 as well as several antibody test showing previous Covid. Past Medical History:  Diagnosis Date  . Cataract 2014   removed  . COVID-19   . DDD (degenerative disc disease), lumbar   . Diverticulosis   . Empyema lung ([place])   . Headache   . Hypertension   . IBS (irritable bowel syndrome)    Past Surgical History:  Procedure Laterality Date  . cataracts    . COLON SURGERY  1995   fistula  . COLONOSCOPY    . EYE SURGERY  2014   catarats/lens replaced  . mohs  2015   skin cancer from nose  . POLYPECTOMY    . VASECTOMY  1985   Current Outpatient Medications on File Prior to Visit  Medication Sig Dispense Refill  . acetaminophen (TYLENOL) 325 MG tablet Take 2 tablets (650 mg total) by mouth every 6 (six) hours as needed for mild pain or headache (fever >/= 101).    Francia Greaves THYROID 30 MG tablet Take 30 mg by mouth daily.    Marland Kitchen docusate sodium (COLACE) 100 MG capsule Take 200 mg by mouth daily as needed for mild constipation or moderate constipation.     Marland Kitchen EC-RX Testosterone 20 %  CREA Apply 20 % topically.    . Multiple Vitamins-Minerals (OCUVITE PRESERVISION PO) Take 2 tablets by mouth daily.     Marland Kitchen Nystatin 1000000 units CAPS Take 250,000 capsules by mouth. Morning and Night    . Omega-3 Fatty Acids (FISH OIL) 1200 MG CAPS Take 2 capsules by mouth daily.    Marland Kitchen zolpidem (AMBIEN) 10 MG tablet TAKE 0.5-1 TABLETS (5-10 MG TOTAL) BY MOUTH AT BEDTIME AS NEEDED. FOR SLEEP 30 tablet 3  . Glucosamine-Chondroit-Vit C-Mn (GLUCOSAMINE CHONDROITIN COMPLX) CAPS Take 2 capsules by mouth daily. 1800 mg    . hyoscyamine (LEVSIN SL) 0.125 MG SL tablet PLACE 1 TABLET UNDER THE TONGUE 4 (FOUR) TIMES DAILY AS NEEDED. (Patient not taking: Reported on 07/06/2020) 360 tablet 1  . UNABLE TO FIND Med Name: Amphotesacin (compounded med)- 25 mg cap 1 daily (Patient not taking: Reported on 07/06/2020)     No current facility-administered medications on file prior to visit.   Allergies  Allergen Reactions  . Morphine     Other reaction(s): VOMITING   Social History   Socioeconomic History  . Marital status: Married    Spouse name: Not on file  . Number of children: Not on file  . Years of education: College  . Highest education level: Not on file  Occupational History  . Occupation: Teacher, music  Tobacco Use  . Smoking status: Former Smoker    Types: Cigars    Quit date: 01/28/2020    Years since quitting: 0.4  . Smokeless tobacco: Never Used  . Tobacco comment: quit cigarettes in 1973  Vaping Use  . Vaping Use: Never used  Substance and Sexual Activity  . Alcohol use: Yes    Alcohol/week: 6.0 standard drinks    Types: 2 Cans of beer, 4 Shots of liquor per week  . Drug use: No  . Sexual activity: Yes    Birth control/protection: None  Other Topics Concern  . Not on file  Social History Narrative   Lives at home with wife.   Social Determinants of Health   Financial Resource Strain: Not on file  Food Insecurity: Not on file  Transportation Needs: Not on file  Physical Activity:  Not on file  Stress: Not on file  Social Connections: Not on file  Intimate Partner Violence: Not on file      Review of Systems  All other systems reviewed and are negative.      Objective:   Physical Exam Vitals reviewed.  Constitutional:      General: He is not in acute distress.    Appearance: Normal appearance. He is normal weight. He is not ill-appearing or toxic-appearing.  HENT:     Right Ear: Tympanic membrane and ear canal normal.     Left Ear: Tympanic membrane and ear canal normal.     Nose: Nose normal. No congestion or rhinorrhea.     Mouth/Throat:     Pharynx: No oropharyngeal exudate or posterior oropharyngeal erythema.  Eyes:     General:        Right eye: No discharge.        Left eye: No discharge.     Conjunctiva/sclera: Conjunctivae normal.  Cardiovascular:     Rate and Rhythm: Normal rate and regular rhythm.     Pulses: Normal pulses.     Heart sounds: Normal heart sounds. No murmur heard. No friction rub.  Pulmonary:     Effort: No respiratory distress.     Breath sounds: No decreased breath sounds, wheezing, rhonchi or rales.  Abdominal:     General: There is no distension.     Palpations: Abdomen is soft.     Tenderness: There is no abdominal tenderness. There is no guarding.  Musculoskeletal:     Cervical back: Neck supple.  Lymphadenopathy:     Cervical: No cervical adenopathy.  Skin:    Findings: No bruising, petechiae or rash.  Neurological:     General: No focal deficit present.     Mental Status: He is alert and oriented to person, place, and time.     Cranial Nerves: No cranial nerve deficit.           Assessment & Plan:  Deep vein thrombosis (DVT) of distal vein of lower extremity, unspecified chronicity, unspecified laterality (HCC)  Clinically I think the patient is doing great at this point. Together we have decided to stop the Eliquis after 4 months. He can resume Celebrex as needed for back pain. He is no longer on  oxygen. He is back to doing his normal day-to-day activity such as playing golf without restriction. I reviewed a CBC CMP A1c that he had obtained at another doctor all of which was within normal limits. My only concern is him taking Armour Thyroid with a normal TSH in October. Therefore I would like to see the lab work that justify the starting of the Armour Thyroid. My opinion would be not to take this. He does report some constipation. I recommended MiraLAX as needed on a daily basis to help stay regular. He also reports some postnasal  drip and he can certainly use Xyzal 5 mg a day as needed for postnasal drip. If this contributes to the constipation he can switch to [facility]

## 2020-07-10 DIAGNOSIS — M62830 Muscle spasm of back: Secondary | ICD-10-CM | POA: Diagnosis not present

## 2020-07-10 DIAGNOSIS — M5136 Other intervertebral disc degeneration, lumbar region: Secondary | ICD-10-CM | POA: Diagnosis not present

## 2020-07-10 DIAGNOSIS — M9903 Segmental and somatic dysfunction of lumbar region: Secondary | ICD-10-CM | POA: Diagnosis not present

## 2020-07-10 DIAGNOSIS — M9904 Segmental and somatic dysfunction of sacral region: Secondary | ICD-10-CM | POA: Diagnosis not present

## 2020-07-30 ENCOUNTER — Other Ambulatory Visit: Payer: Self-pay | Admitting: Family Medicine

## 2020-08-15 ENCOUNTER — Other Ambulatory Visit: Payer: Self-pay | Admitting: Family Medicine

## 2020-09-23 ENCOUNTER — Other Ambulatory Visit: Payer: Self-pay | Admitting: Family Medicine

## 2020-09-25 DIAGNOSIS — M9904 Segmental and somatic dysfunction of sacral region: Secondary | ICD-10-CM | POA: Diagnosis not present

## 2020-09-25 DIAGNOSIS — M62830 Muscle spasm of back: Secondary | ICD-10-CM | POA: Diagnosis not present

## 2020-09-25 DIAGNOSIS — M9903 Segmental and somatic dysfunction of lumbar region: Secondary | ICD-10-CM | POA: Diagnosis not present

## 2020-09-25 DIAGNOSIS — M5136 Other intervertebral disc degeneration, lumbar region: Secondary | ICD-10-CM | POA: Diagnosis not present

## 2020-10-05 DIAGNOSIS — D225 Melanocytic nevi of trunk: Secondary | ICD-10-CM | POA: Diagnosis not present

## 2020-10-05 DIAGNOSIS — D229 Melanocytic nevi, unspecified: Secondary | ICD-10-CM | POA: Diagnosis not present

## 2020-10-05 DIAGNOSIS — B353 Tinea pedis: Secondary | ICD-10-CM | POA: Diagnosis not present

## 2020-10-05 DIAGNOSIS — L570 Actinic keratosis: Secondary | ICD-10-CM | POA: Diagnosis not present

## 2020-10-05 DIAGNOSIS — L820 Inflamed seborrheic keratosis: Secondary | ICD-10-CM | POA: Diagnosis not present

## 2020-10-05 DIAGNOSIS — L821 Other seborrheic keratosis: Secondary | ICD-10-CM | POA: Diagnosis not present

## 2020-10-16 DIAGNOSIS — K5900 Constipation, unspecified: Secondary | ICD-10-CM | POA: Diagnosis not present

## 2020-10-16 DIAGNOSIS — E559 Vitamin D deficiency, unspecified: Secondary | ICD-10-CM | POA: Diagnosis not present

## 2020-10-16 DIAGNOSIS — R5383 Other fatigue: Secondary | ICD-10-CM | POA: Diagnosis not present

## 2020-10-16 DIAGNOSIS — R7989 Other specified abnormal findings of blood chemistry: Secondary | ICD-10-CM | POA: Diagnosis not present

## 2020-10-16 DIAGNOSIS — E538 Deficiency of other specified B group vitamins: Secondary | ICD-10-CM | POA: Diagnosis not present

## 2020-10-16 DIAGNOSIS — E039 Hypothyroidism, unspecified: Secondary | ICD-10-CM | POA: Diagnosis not present

## 2020-10-24 DIAGNOSIS — M9903 Segmental and somatic dysfunction of lumbar region: Secondary | ICD-10-CM | POA: Diagnosis not present

## 2020-10-24 DIAGNOSIS — M5136 Other intervertebral disc degeneration, lumbar region: Secondary | ICD-10-CM | POA: Diagnosis not present

## 2020-10-24 DIAGNOSIS — M62830 Muscle spasm of back: Secondary | ICD-10-CM | POA: Diagnosis not present

## 2020-10-24 DIAGNOSIS — M9904 Segmental and somatic dysfunction of sacral region: Secondary | ICD-10-CM | POA: Diagnosis not present

## 2020-10-25 DIAGNOSIS — L570 Actinic keratosis: Secondary | ICD-10-CM | POA: Diagnosis not present

## 2020-11-02 DIAGNOSIS — H10013 Acute follicular conjunctivitis, bilateral: Secondary | ICD-10-CM | POA: Diagnosis not present

## 2020-11-07 DIAGNOSIS — M9904 Segmental and somatic dysfunction of sacral region: Secondary | ICD-10-CM | POA: Diagnosis not present

## 2020-11-07 DIAGNOSIS — M5136 Other intervertebral disc degeneration, lumbar region: Secondary | ICD-10-CM | POA: Diagnosis not present

## 2020-11-07 DIAGNOSIS — M9903 Segmental and somatic dysfunction of lumbar region: Secondary | ICD-10-CM | POA: Diagnosis not present

## 2020-11-07 DIAGNOSIS — M62830 Muscle spasm of back: Secondary | ICD-10-CM | POA: Diagnosis not present

## 2020-11-14 ENCOUNTER — Other Ambulatory Visit: Payer: Self-pay

## 2020-11-14 ENCOUNTER — Ambulatory Visit: Payer: BC Managed Care – PPO

## 2020-11-14 ENCOUNTER — Other Ambulatory Visit: Payer: BC Managed Care – PPO

## 2020-11-14 DIAGNOSIS — I10 Essential (primary) hypertension: Secondary | ICD-10-CM

## 2020-11-14 DIAGNOSIS — Z125 Encounter for screening for malignant neoplasm of prostate: Secondary | ICD-10-CM | POA: Diagnosis not present

## 2020-11-14 LAB — PSA: PSA: 0.35 ng/mL (ref <=4.00)

## 2020-11-15 ENCOUNTER — Ambulatory Visit (INDEPENDENT_AMBULATORY_CARE_PROVIDER_SITE_OTHER): Payer: BC Managed Care – PPO | Admitting: Nurse Practitioner

## 2020-11-15 ENCOUNTER — Encounter: Payer: Self-pay | Admitting: Nurse Practitioner

## 2020-11-15 VITALS — BP 122/74 | HR 94 | Temp 97.3°F | Wt 262.6 lb

## 2020-11-15 DIAGNOSIS — M25562 Pain in left knee: Secondary | ICD-10-CM | POA: Diagnosis not present

## 2020-11-15 MED ORDER — NAPROXEN 500 MG PO TABS: 500.0000 mg | ORAL_TABLET | Freq: Two times a day (BID) | ORAL | 0 refills | Status: DC

## 2020-11-15 NOTE — Progress Notes (Signed)
Subjective:    Patient ID: Ryan Turner, male    DOB: 09-04-1950, 70 y.o.   MRN: [id number]  HPI: Ryan Turner is a 70 y.o. male presenting for knee pain.  Chief Complaint  Patient presents with  . Knee Pain   KNEE PAIN Duration: days Involved knee: left Mechanism of injury: got out of car and twisted knee; no fall or injury Location: inferior Onset: sudden Severity:  6/10 Quality:  sharp Frequency: intermittent Radiation: no Aggravating factors: movement, weight bearing,   Alleviating factors: ice  Status: better Treatments attempted: ice  Relief with NSAIDs?:  mild Weakness with weight bearing or walking: yes Sensation of giving way: yes Locking: no clicking: yes Bruising: no Swelling: yes Redness: no Paresthesias/decreased sensation: no Fevers: no  Allergies  Allergen Reactions  . Morphine     Other reaction(s): VOMITING    Outpatient Encounter Medications as of 11/15/2020  Medication Sig Note  . naproxen (NAPROSYN) 500 MG tablet Take 1 tablet (500 mg total) by mouth 2 (two) times daily with a meal.   . acetaminophen (TYLENOL) 325 MG tablet Take 2 tablets (650 mg total) by mouth every 6 (six) hours as needed for mild pain or headache (fever >/= 101).   Francia Greaves THYROID 30 MG tablet Take 30 mg by mouth daily.   . celecoxib (CELEBREX) 200 MG capsule Take 1 capsule (200 mg total) by mouth as needed.   . docusate sodium (COLACE) 100 MG capsule Take 200 mg by mouth daily as needed for mild constipation or moderate constipation.    Marland Kitchen EC-RX Testosterone 20 % CREA Apply 20 % topically.   . Glucosamine-Chondroit-Vit C-Mn (GLUCOSAMINE CHONDROITIN COMPLX) CAPS Take 2 capsules by mouth daily. 1800 mg   . hyoscyamine (LEVSIN SL) 0.125 MG SL tablet PLACE 1 TABLET UNDER THE TONGUE 4 (FOUR) TIMES DAILY AS NEEDED. (Patient not taking: Reported on 07/06/2020)   . levocetirizine (XYZAL) 5 MG tablet TAKE 1 TABLET BY MOUTH EVERY DAY IN THE EVENING   . Multiple  Vitamins-Minerals (OCUVITE PRESERVISION PO) Take 2 tablets by mouth daily.  02/02/2019: Take 2 daily  . Nystatin 1000000 units CAPS Take 250,000 capsules by mouth. Morning and Night   . Omega-3 Fatty Acids (FISH OIL) 1200 MG CAPS Take 2 capsules by mouth daily.   Marland Kitchen UNABLE TO FIND Med Name: Amphotesacin (compounded med)- 25 mg cap 1 daily (Patient not taking: Reported on 07/06/2020)   . zolpidem (AMBIEN) 10 MG tablet TAKE 0.5-1 TABLETS (5-10 MG TOTAL) BY MOUTH AT BEDTIME AS NEEDED. FOR SLEEP    No facility-administered encounter medications on file as of 11/15/2020.    Patient Active Problem List   Diagnosis Date Noted  . Peripheral edema 05/12/2020  . Empyema lung ([place])   . Pressure injury of skin 03/16/2020  . Empyema of right pleural space ([place]) 03/14/2020  . DVT (deep venous thrombosis) ([place]) 03/14/2020  . Irritable bowel syndrome 02/21/2020  . Acute respiratory failure with hypoxia ([place]) 02/21/2020  . Pneumonia due to COVID-19 virus 02/20/2020  . Persistent headaches 05/15/2017  . DDD (degenerative disc disease), lumbar   . Diverticulosis   . Cataract   . Hypertension     Past Medical History:  Diagnosis Date  . Cataract 2014   removed  . COVID-19   . DDD (degenerative disc disease), lumbar   . Diverticulosis   . Empyema lung ([place])   . Headache   . Hypertension   . IBS (irritable bowel syndrome)  Relevant past medical, surgical, family and social history reviewed and updated as indicated. Interim medical history since our last visit reviewed.  Review of Systems Per HPI unless specifically indicated above     Objective:    BP 122/74   Pulse 94   Temp (!) 97.3 F (36.3 C)   Wt 262 lb 9.6 oz (119.1 kg)   BMI 34.65 kg/m   Wt Readings from Last 3 Encounters:  11/15/20 262 lb 9.6 oz (119.1 kg)  07/06/20 260 lb (117.9 kg)  06/22/20 255 lb (115.7 kg)    Physical Exam Vitals and nursing note reviewed.  Constitutional:      General: He is not in acute  distress.    Appearance: Normal appearance. He is not toxic-appearing.  Musculoskeletal:        General: Normal range of motion.     Right knee: Normal. No swelling, effusion, erythema or bony tenderness. Normal range of motion. No tenderness.     Left knee: Swelling present. No effusion, erythema, ecchymosis or bony tenderness. Normal range of motion. Tenderness present over the patellar tendon.     Right lower leg: No edema.     Left lower leg: No edema.  Skin:    General: Skin is warm and dry.     Capillary Refill: Capillary refill takes less than 2 seconds.     Coloration: Skin is not jaundiced or pale.     Findings: No erythema.  Neurological:     Mental Status: He is alert and oriented to person, place, and time.  Psychiatric:        Mood and Affect: Mood normal.        Behavior: Behavior normal.        Thought Content: Thought content normal.        Judgment: Judgment normal.       Assessment & Plan:  1. Acute pain of left knee Acute.  Suspect knee sprain.  No red flags today.  Encouraged rest, ice, compression/immobilization with brace, and elevation.  Start twice daily NSAID with food for short-term relief, not to use when taking Celebrex.  Follow up if knee pain worsens or persists.   - naproxen (NAPROSYN) 500 MG tablet; Take 1 tablet (500 mg total) by mouth 2 (two) times daily with a meal.  Dispense: 30 tablet; Refill: 0    Follow up plan: Return if symptoms worsen or fail to improve.

## 2020-11-15 NOTE — Patient Instructions (Signed)
Knee Sprain  A knee sprain is a stretch or tear in a knee ligament. Knee ligaments are tissues that connect bones in the knee to each other. What are the causes? This condition often results from:  A fall.  An injury to the knee. What are the signs or symptoms? Symptoms of this condition include:  Trouble straightening or bending the leg.  Swelling in the knee.  Bruising around the knee.  Tenderness or pain in the knee.  Sudden muscle tightening (spasms) around the knee. How is this treated? Treatment for this condition may involve:  Keeping the knee still (immobilized) with a cast, brace, or splint.  Putting ice on the knee. This helps with pain and swelling.  Raising (elevating) the knee above the level of your heart when you are resting. This helps with pain and swelling.  Taking medicine for pain.  Doing exercises to keep your knee from being weak or stiff.  Having surgery. This may be needed if the ligament is completely torn. Follow these instructions at home: If you have a splint or brace:  Wear it as told by your doctor. Remove it only as told by your doctor.  Check the skin around it every day. Tell your doctor about any concerns.  Loosen it if your toes: ? Tingle. ? Become numb. ? Turn cold and blue.  Keep it clean and dry. If you have a cast:  Do not stick anything inside it to scratch your skin.  Check the skin around it every day. Tell your doctor about any concerns.  You may put lotion on dry skin around the edges of the cast. Do not put lotion on the skin under the cast.  Keep it clean and dry. Bathing If you have a splint, brace, or cast that is not waterproof:  Do not let it get wet.  Cover it with a watertight covering when you take a bath or a shower. Managing pain, stiffness, and swelling  If told, put ice on the injured area. To do this: ? If you have a removable splint or brace, remove it as told by your doctor. ? Put ice in a  plastic bag. ? Place a towel between your skin and the bag or between your cast and the bag. ? Leave the ice on for 20 minutes, 2-3 times a day.  Move your toes often to reduce stiffness and swelling.  Raise the injured area above the level of your heart while you are sitting or lying down.   General instructions  Take over-the-counter and prescription medicines only as told by your doctor.  Do not use any products that contain nicotine or tobacco, such as cigarettes, e-cigarettes, and chewing tobacco. These can delay healing. If you need help quitting, ask your doctor.  Do exercises as told by your doctor.  Keep all follow-up visits as told by your doctor. This is important. Contact a doctor if:  Your pain gets worse.  The cast, brace, or splint does not fit right.  The cast, brace, or splint gets damaged. Get help right away if:  You cannot use your knee to support your body weight (bear weight) for standing or walking.  You cannot move the injured area.  Your knee buckles or you have pain after you walk only a few steps.  You have very bad pain, swelling, or numbness in your leg below the cast, brace, or splint.  Your foot or toes are numb, cold, or blue after loosening your  splint or brace. Summary  A knee sprain is a stretch or tear in a tissue (ligament) that connects your knee bones to each other.  You may need to wear a splint, brace, or cast to keep the knee still while it is getting better.  Surgery may be needed if the ligament is completely torn. This information is not intended to replace advice given to you by your health care provider. Make sure you discuss any questions you have with your health care provider. Document Revised: 05/28/2019 Document Reviewed: 05/28/2019 Elsevier Patient Education  [employer].

## 2020-11-17 ENCOUNTER — Ambulatory Visit: Payer: BC Managed Care – PPO | Admitting: Family Medicine

## 2020-11-21 DIAGNOSIS — M9903 Segmental and somatic dysfunction of lumbar region: Secondary | ICD-10-CM | POA: Diagnosis not present

## 2020-11-21 DIAGNOSIS — M5136 Other intervertebral disc degeneration, lumbar region: Secondary | ICD-10-CM | POA: Diagnosis not present

## 2020-11-21 DIAGNOSIS — M9904 Segmental and somatic dysfunction of sacral region: Secondary | ICD-10-CM | POA: Diagnosis not present

## 2020-11-21 DIAGNOSIS — M62830 Muscle spasm of back: Secondary | ICD-10-CM | POA: Diagnosis not present

## 2020-12-05 DIAGNOSIS — M9904 Segmental and somatic dysfunction of sacral region: Secondary | ICD-10-CM | POA: Diagnosis not present

## 2020-12-05 DIAGNOSIS — M9903 Segmental and somatic dysfunction of lumbar region: Secondary | ICD-10-CM | POA: Diagnosis not present

## 2020-12-05 DIAGNOSIS — M5136 Other intervertebral disc degeneration, lumbar region: Secondary | ICD-10-CM | POA: Diagnosis not present

## 2020-12-05 DIAGNOSIS — M62830 Muscle spasm of back: Secondary | ICD-10-CM | POA: Diagnosis not present

## 2020-12-19 DIAGNOSIS — M9903 Segmental and somatic dysfunction of lumbar region: Secondary | ICD-10-CM | POA: Diagnosis not present

## 2020-12-19 DIAGNOSIS — M9904 Segmental and somatic dysfunction of sacral region: Secondary | ICD-10-CM | POA: Diagnosis not present

## 2020-12-19 DIAGNOSIS — M62830 Muscle spasm of back: Secondary | ICD-10-CM | POA: Diagnosis not present

## 2020-12-19 DIAGNOSIS — M5136 Other intervertebral disc degeneration, lumbar region: Secondary | ICD-10-CM | POA: Diagnosis not present

## 2020-12-28 IMAGING — CT CT CHEST W/O CM
2 of 3 series · 15 of 36 positions shown, 18 images · non-contrast
Comparison: 03/14/2020

CLINICAL DATA: Empyema

EXAM:
CT CHEST WITHOUT CONTRAST
TECHNIQUE: Multidetector CT imaging of the chest was performed following the
standard protocol without IV contrast.

[Series 3: chest w/o 2mm st · axial · non-contrast · 0.93mm/px · z∈[-331,-57]mm · 12 of 161 slices shown, 15 images]
[im 12/161  mediastinal]
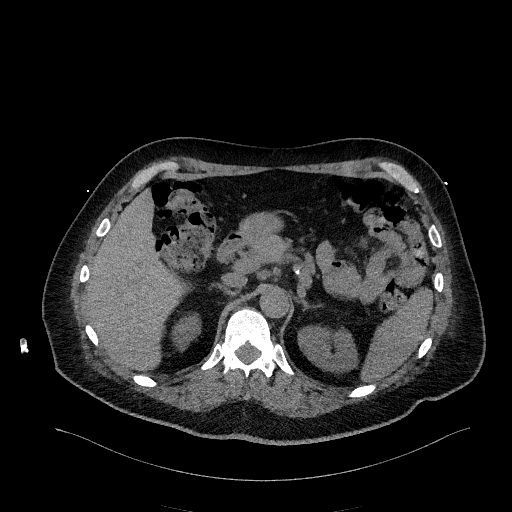
[im 12/161  lung]
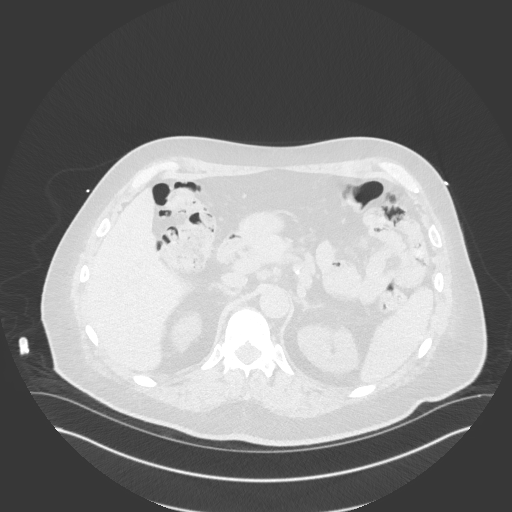
[im 24/161  lung]
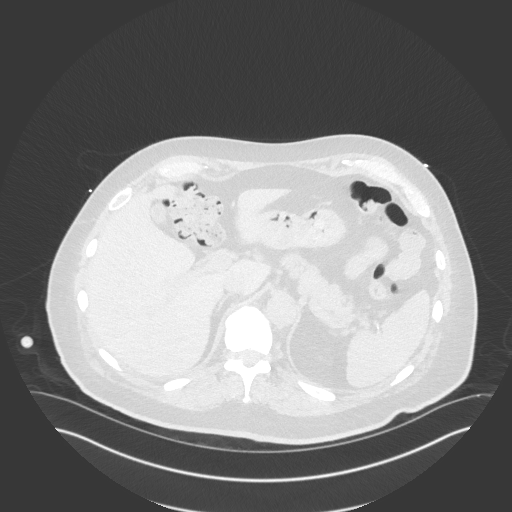
[im 36/161  lung]
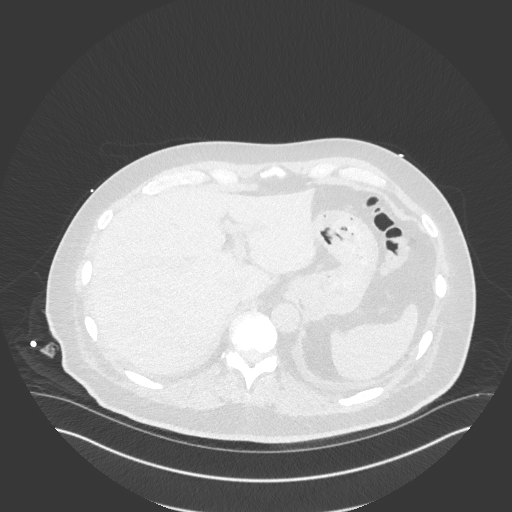
[im 48/161  lung]
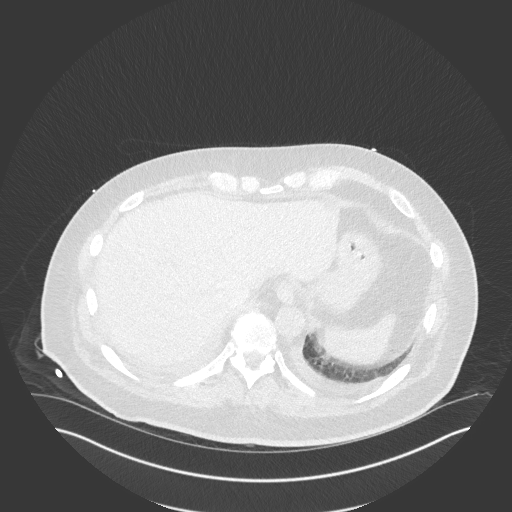
[im 60/161  mediastinal]
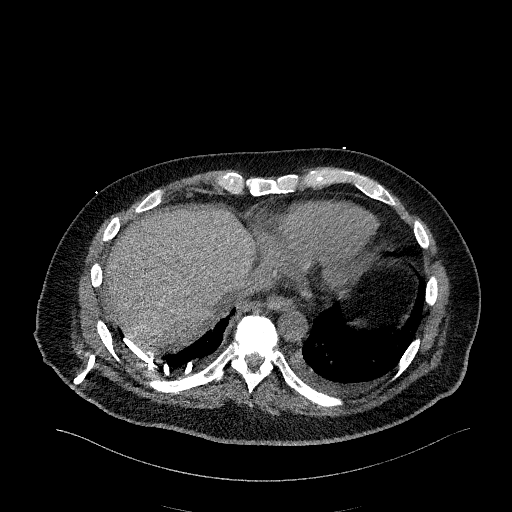
[im 60/161  lung]
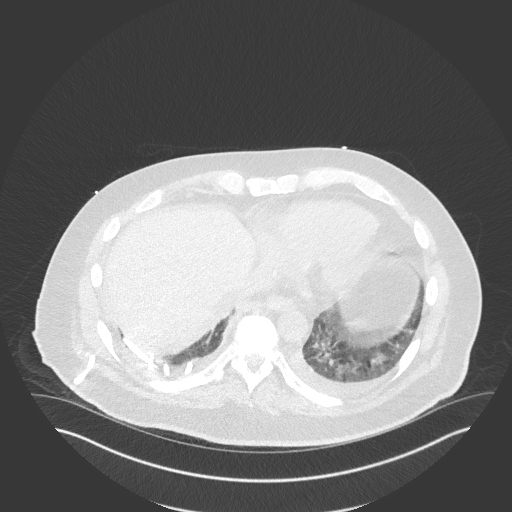
[im 72/161  lung]
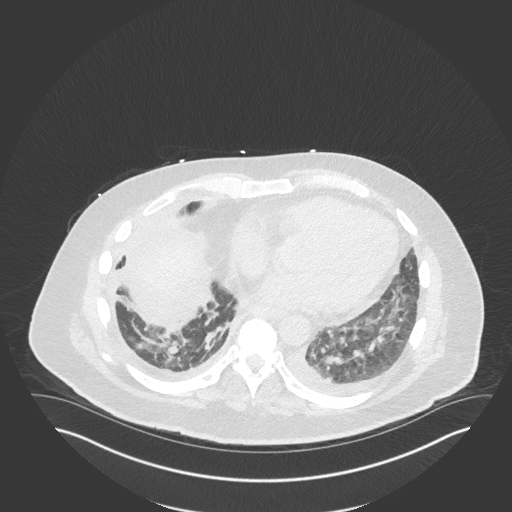
[im 89/161  lung]
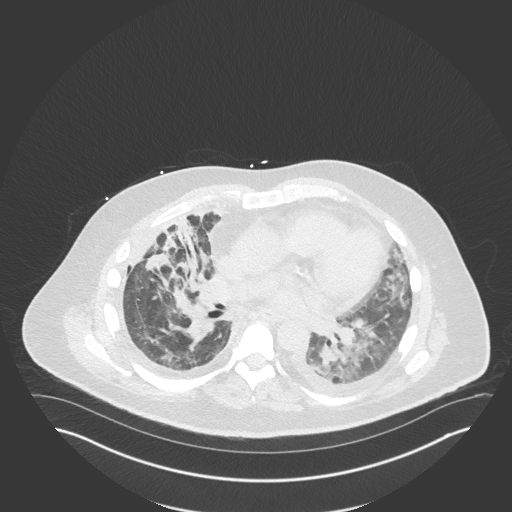
[im 101/161  lung]
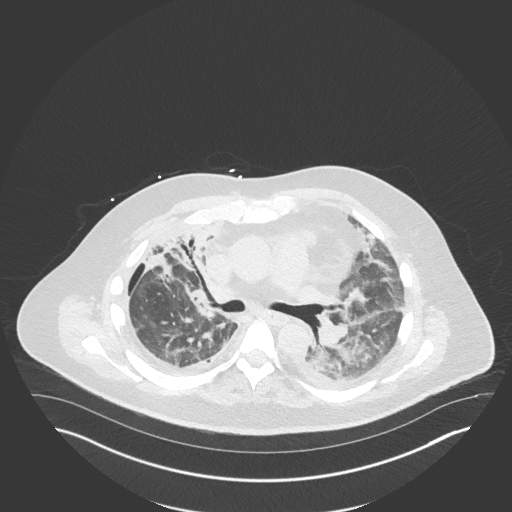
[im 113/161  mediastinal]
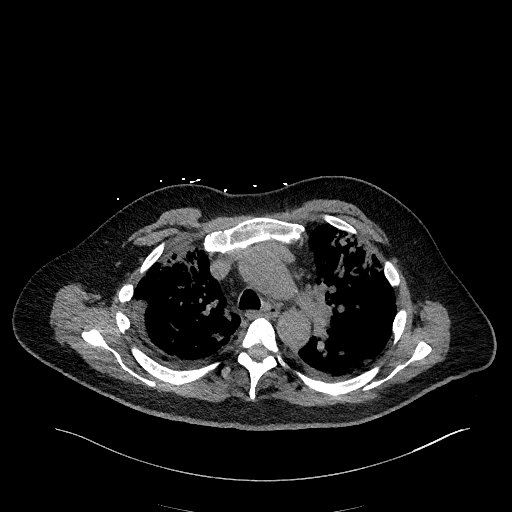
[im 113/161  lung]
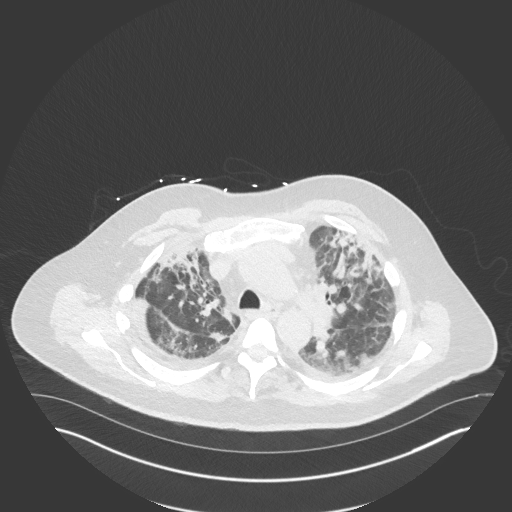
[im 125/161  lung]
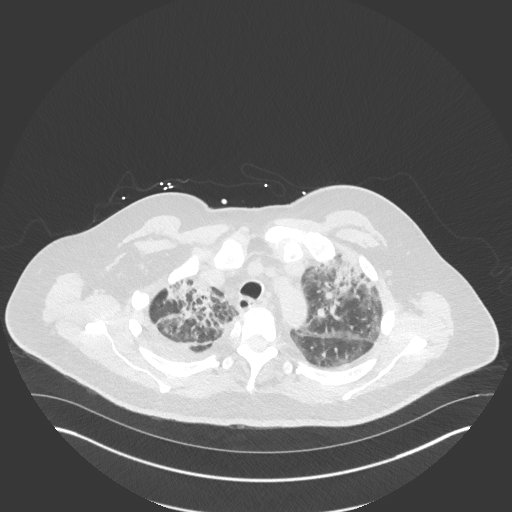
[im 137/161  lung]
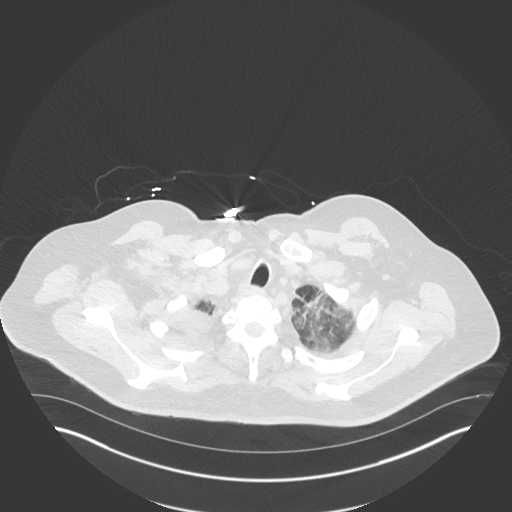
[im 149/161  lung]
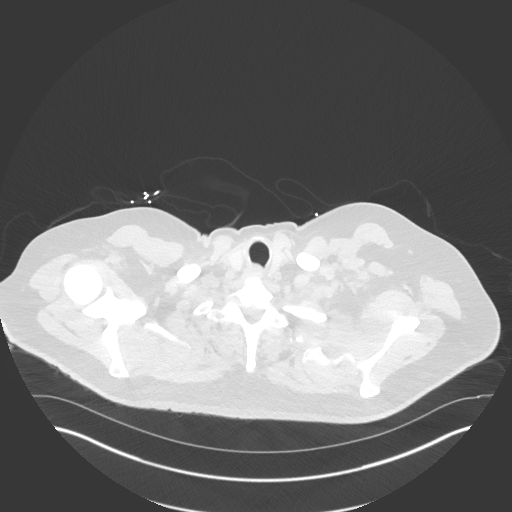

[Series 6: chest w/o 2mm st cor · coronal · non-contrast · 0.60mm/px · 3 of 130 slices shown]
[im 26/130  lung]
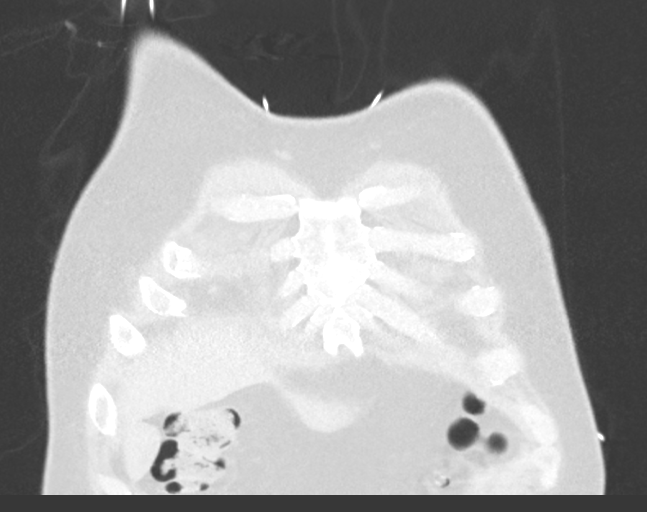
[im 52/130  lung]
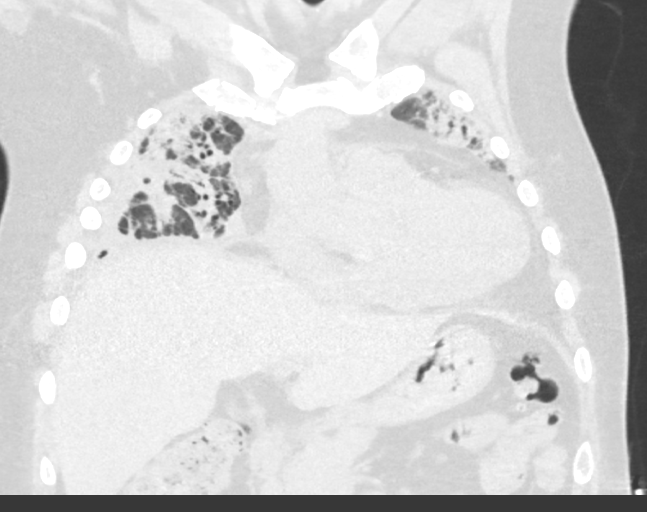
[im 78/130  lung]
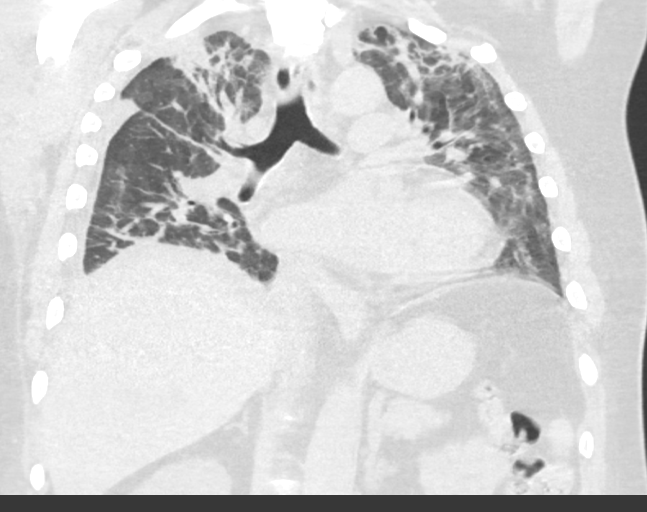

[15 of 36 positions shown; findings below may reference images not displayed]

FINDINGS: Cardiovascular: Heart is unremarkable without pericardial effusion.
Normal caliber of the thoracic aorta. Stable atherosclerosis of the
LAD distribution of the coronary vasculature.

Mediastinum/Nodes: No pathologic adenopathy. Thyroid, esophagus, and
trachea are grossly normal.

Lungs/Pleura: Pigtail drainage catheter is seen within the right
posterior costophrenic angle. There has been near complete
evacuation of the complex right pleural effusion since prior study,
less than 100 cc remaining. Small amount of gas within the right
pleural space likely sequela of drain placement. Trace free-flowing
left pleural effusion, volume estimated less than 100 cc.

Patchy bilateral areas of lung consolidation are again noted,
consistent with multifocal pneumonia. Bilateral bronchiectasis again
noted.

Central airways are patent.

Upper Abdomen: Limited imaging through the upper abdomen
demonstrates no acute abnormalities.

Musculoskeletal: No acute or destructive bony lesions. Reconstructed
images demonstrate no additional findings.
IMPRESSION: 1. Near complete evacuation of the complex right pleural effusion
after pigtail drainage catheter placement. Less than 100 cc of fluid
remaining.
2. Patchy bilateral areas of airspace disease consistent with
multifocal pneumonia.
3. Trace left pleural effusion.
4. Bilateral bronchiectasis.

## 2021-01-09 DIAGNOSIS — M5136 Other intervertebral disc degeneration, lumbar region: Secondary | ICD-10-CM | POA: Diagnosis not present

## 2021-01-09 DIAGNOSIS — M9904 Segmental and somatic dysfunction of sacral region: Secondary | ICD-10-CM | POA: Diagnosis not present

## 2021-01-09 DIAGNOSIS — M9903 Segmental and somatic dysfunction of lumbar region: Secondary | ICD-10-CM | POA: Diagnosis not present

## 2021-01-09 DIAGNOSIS — M62830 Muscle spasm of back: Secondary | ICD-10-CM | POA: Diagnosis not present

## 2021-01-11 ENCOUNTER — Ambulatory Visit (INDEPENDENT_AMBULATORY_CARE_PROVIDER_SITE_OTHER): Payer: BC Managed Care – PPO

## 2021-01-11 ENCOUNTER — Other Ambulatory Visit: Payer: Self-pay

## 2021-01-11 VITALS — BP 126/82 | HR 86 | Temp 97.7°F | Ht 73.0 in | Wt 269.2 lb

## 2021-01-11 DIAGNOSIS — Z0000 Encounter for general adult medical examination without abnormal findings: Secondary | ICD-10-CM

## 2021-01-11 NOTE — Patient Instructions (Signed)
Ryan Turner , Thank you for taking time to come for your Medicare Wellness Visit. I appreciate your ongoing commitment to your health goals. Please review the following plan we discussed and let me know if I can assist you in the future.   Screening recommendations/referrals: Colonoscopy: Up to date, next due 02/15/2029 Recommended yearly ophthalmology/optometry visit for glaucoma screening and checkup Recommended yearly dental visit for hygiene and checkup  Vaccinations: Influenza vaccine: Up to date, next due fall 2022  Pneumococcal vaccine: completed series  Tdap vaccine: Up to date, next due 10/22/2025 Shingles vaccine: Completed series     Advanced directives: Please bring a copy of your advanced medical directives so that we may scan into your chart  Conditions/risks identified: None  Next appointment: None  Preventive Care 72 Years and Older, Male Preventive care refers to lifestyle choices and visits with your health care provider that can promote health and wellness. What does preventive care include? A yearly physical exam. This is also called an annual well check. Dental exams once or twice a year. Routine eye exams. Ask your health care provider how often you should have your eyes checked. Personal lifestyle choices, including: Daily care of your teeth and gums. Regular physical activity. Eating a healthy diet. Avoiding tobacco and drug use. Limiting alcohol use. Practicing safe sex. Taking low doses of aspirin every day. Taking vitamin and mineral supplements as recommended by your health care provider. What happens during an annual well check? The services and screenings done by your health care provider during your annual well check will depend on your age, overall health, lifestyle risk factors, and family history of disease. Counseling  Your health care provider may ask you questions about your: Alcohol use. Tobacco use. Drug use. Emotional  well-being. Home and relationship well-being. Sexual activity. Eating habits. History of falls. Memory and ability to understand (cognition). Work and work Statistician. Screening  You may have the following tests or measurements: Height, weight, and BMI. Blood pressure. Lipid and cholesterol levels. These may be checked every 5 years, or more frequently if you are over 6 years old. Skin check. Lung cancer screening. You may have this screening every year starting at age 28 if you have a 30-pack-year history of smoking and currently smoke or have quit within the past 15 years. Fecal occult blood test (FOBT) of the stool. You may have this test every year starting at age 47. Flexible sigmoidoscopy or colonoscopy. You may have a sigmoidoscopy every 5 years or a colonoscopy every 10 years starting at age 50. Prostate cancer screening. Recommendations will vary depending on your family history and other risks. Hepatitis C blood test. Hepatitis B blood test. Sexually transmitted disease (STD) testing. Diabetes screening. This is done by checking your blood sugar (glucose) after you have not eaten for a while (fasting). You may have this done every 1-3 years. Abdominal aortic aneurysm (AAA) screening. You may need this if you are a current or former smoker. Osteoporosis. You may be screened starting at age 19 if you are at high risk. Talk with your health care provider about your test results, treatment options, and if necessary, the need for more tests. Vaccines  Your health care provider may recommend certain vaccines, such as: Influenza vaccine. This is recommended every year. Tetanus, diphtheria, and acellular pertussis (Tdap, Td) vaccine. You may need a Td booster every 10 years. Zoster vaccine. You may need this after age 51. Pneumococcal 13-valent conjugate (PCV13) vaccine. One dose is recommended after  age 35. Pneumococcal polysaccharide (PPSV23) vaccine. One dose is recommended after  age 58. Talk to your health care provider about which screenings and vaccines you need and how often you need them. This information is not intended to replace advice given to you by your health care provider. Make sure you discuss any questions you have with your health care provider. Document Released: 08/04/2015 Document Revised: 03/27/2016 Document Reviewed: 05/09/2015 Elsevier Interactive Patient Education  2017 [employer] Prevention in the Home Falls can cause injuries. They can happen to people of all ages. There are many things you can do to make your home safe and to help prevent falls. What can I do on the outside of my home? Regularly fix the edges of walkways and driveways and fix any cracks. Remove anything that might make you trip as you walk through a door, such as a raised step or threshold. Trim any bushes or trees on the path to your home. Use bright outdoor lighting. Clear any walking paths of anything that might make someone trip, such as rocks or tools. Regularly check to see if handrails are loose or broken. Make sure that both sides of any steps have handrails. Any raised decks and porches should have guardrails on the edges. Have any leaves, snow, or ice cleared regularly. Use sand or salt on walking paths during winter. Clean up any spills in your garage right away. This includes oil or grease spills. What can I do in the bathroom? Use night lights. Install grab bars by the toilet and in the tub and shower. Do not use towel bars as grab bars. Use non-skid mats or decals in the tub or shower. If you need to sit down in the shower, use a plastic, non-slip stool. Keep the floor dry. Clean up any water that spills on the floor as soon as it happens. Remove soap buildup in the tub or shower regularly. Attach bath mats securely with double-sided non-slip rug tape. Do not have throw rugs and other things on the floor that can make you trip. What can I do in the  bedroom? Use night lights. Make sure that you have a light by your bed that is easy to reach. Do not use any sheets or blankets that are too big for your bed. They should not hang down onto the floor. Have a firm chair that has side arms. You can use this for support while you get dressed. Do not have throw rugs and other things on the floor that can make you trip. What can I do in the kitchen? Clean up any spills right away. Avoid walking on wet floors. Keep items that you use a lot in easy-to-reach places. If you need to reach something above you, use a strong step stool that has a grab bar. Keep electrical cords out of the way. Do not use floor polish or wax that makes floors slippery. If you must use wax, use non-skid floor wax. Do not have throw rugs and other things on the floor that can make you trip. What can I do with my stairs? Do not leave any items on the stairs. Make sure that there are handrails on both sides of the stairs and use them. Fix handrails that are broken or loose. Make sure that handrails are as long as the stairways. Check any carpeting to make sure that it is firmly attached to the stairs. Fix any carpet that is loose or worn. Avoid having throw rugs  at the top or bottom of the stairs. If you do have throw rugs, attach them to the floor with carpet tape. Make sure that you have a light switch at the top of the stairs and the bottom of the stairs. If you do not have them, ask someone to add them for you. What else can I do to help prevent falls? Wear shoes that: Do not have high heels. Have rubber bottoms. Are comfortable and fit you well. Are closed at the toe. Do not wear sandals. If you use a stepladder: Make sure that it is fully opened. Do not climb a closed stepladder. Make sure that both sides of the stepladder are locked into place. Ask someone to hold it for you, if possible. Clearly mark and make sure that you can see: Any grab bars or  handrails. First and last steps. Where the edge of each step is. Use tools that help you move around (mobility aids) if they are needed. These include: Canes. Walkers. Scooters. Crutches. Turn on the lights when you go into a dark area. Replace any light bulbs as soon as they burn out. Set up your furniture so you have a clear path. Avoid moving your furniture around. If any of your floors are uneven, fix them. If there are any pets around you, be aware of where they are. Review your medicines with your doctor. Some medicines can make you feel dizzy. This can increase your chance of falling. Ask your doctor what other things that you can do to help prevent falls. This information is not intended to replace advice given to you by your health care provider. Make sure you discuss any questions you have with your health care provider. Document Released: 05/04/2009 Document Revised: 12/14/2015 Document Reviewed: 08/12/2014 Elsevier Interactive Patient Education  2017 [employer].

## 2021-01-11 NOTE — Progress Notes (Signed)
Subjective:   Ryan Turner is a 70 y.o. male who presents for Medicare Annual/Subsequent preventive examination.  Review of Systems    N/a  Cardiac Risk Factors include: advanced age (>64men, >36 women);male gender     Objective:    Today's Vitals   01/11/21 1445  BP: 126/82  Pulse: 86  Temp: 97.7 F (36.5 C)  TempSrc: Oral  SpO2: 95%  Weight: 269 lb 4 oz (122.1 kg)  Height: 6\' 1"  (1.854 m)   Body mass index is 35.52 kg/m.  Advanced Directives 01/11/2021 03/14/2020 02/21/2020  Does Patient Have a Medical Advance Directive? Yes No Yes  Type of Paramedic of [place];Living will Living will Living will  Does patient want to make changes to medical advance directive? No - Patient declined No - Patient declined No - Patient declined  Copy of [place] in Chart? No - copy requested - -  Would patient like information on creating a medical advance directive? - No - Patient declined -    Current Medications (verified) Outpatient Encounter Medications as of 01/11/2021  Medication Sig   celecoxib (CELEBREX) 200 MG capsule Take 1 capsule (200 mg total) by mouth as needed.   EC-RX Testosterone 20 % CREA Apply 20 % topically.   Glucosamine-Chondroit-Vit C-Mn (GLUCOSAMINE CHONDROITIN COMPLX) CAPS Take 2 capsules by mouth daily. 1800 mg   Multiple Vitamins-Minerals (OCUVITE PRESERVISION PO) Take 2 tablets by mouth daily.    Omega-3 Fatty Acids (FISH OIL) 1200 MG CAPS Take 2 capsules by mouth daily.   zolpidem (AMBIEN) 10 MG tablet TAKE 0.5-1 TABLETS (5-10 MG TOTAL) BY MOUTH AT BEDTIME AS NEEDED. FOR SLEEP   ARMOUR THYROID 30 MG tablet Take 30 mg by mouth daily. (Patient not taking: Reported on 01/11/2021)   docusate sodium (COLACE) 100 MG capsule Take 200 mg by mouth daily as needed for mild constipation or moderate constipation.  (Patient not taking: Reported on 01/11/2021)   hyoscyamine (LEVSIN SL) 0.125 MG SL tablet PLACE 1 TABLET UNDER THE  TONGUE 4 (FOUR) TIMES DAILY AS NEEDED. (Patient not taking: No sig reported)   [DISCONTINUED] acetaminophen (TYLENOL) 325 MG tablet Take 2 tablets (650 mg total) by mouth every 6 (six) hours as needed for mild pain or headache (fever >/= 101).   [DISCONTINUED] levocetirizine (XYZAL) 5 MG tablet TAKE 1 TABLET BY MOUTH EVERY DAY IN THE EVENING (Patient not taking: Reported on 01/11/2021)   [DISCONTINUED] naproxen (NAPROSYN) 500 MG tablet Take 1 tablet (500 mg total) by mouth 2 (two) times daily with a meal. (Patient not taking: Reported on 01/11/2021)   [DISCONTINUED] Nystatin 1000000 units CAPS Take 250,000 capsules by mouth. Morning and Night (Patient not taking: Reported on 01/11/2021)   [DISCONTINUED] UNABLE TO FIND Med Name: Amphotesacin (compounded med)- 25 mg cap 1 daily (Patient not taking: No sig reported)   No facility-administered encounter medications on file as of 01/11/2021.    Allergies (verified) Morphine   History: Past Medical History:  Diagnosis Date   Cataract 2014   removed   COVID-19    DDD (degenerative disc disease), lumbar    Diverticulosis    Empyema lung ([place])    Headache    Hypertension    IBS (irritable bowel syndrome)    Past Surgical History:  Procedure Laterality Date   cataracts     COLON SURGERY  1995   fistula   COLONOSCOPY     EYE SURGERY  2014   catarats/lens replaced   mohs  2015   skin cancer from nose   POLYPECTOMY     VASECTOMY  1985   Family History  Problem Relation Age of Onset   Cancer Mother        lung (unknown primary)   Cancer Father        lung mets to spine   Cancer Brother        melanoma   Leukemia Maternal Grandfather    Stomach cancer Paternal Grandmother    Colon cancer Neg Hx    Colon polyps Neg Hx    Diabetes Neg Hx    Esophageal cancer Neg Hx    Rectal cancer Neg Hx    Social History   Socioeconomic History   Marital status: Married    Spouse name: Not on file   Number of children: Not on file   Years of  education: College   Highest education level: Not on file  Occupational History   Occupation: Broker  Tobacco Use   Smoking status: Former    Pack years: 0.00    Types: Cigars    Quit date: 01/28/2020    Years since quitting: 0.9   Smokeless tobacco: Never   Tobacco comments:    quit cigarettes in 1973  Vaping Use   Vaping Use: Never used  Substance and Sexual Activity   Alcohol use: Yes    Alcohol/week: 6.0 standard drinks    Types: 2 Cans of beer, 4 Shots of liquor per week   Drug use: No   Sexual activity: Yes    Birth control/protection: None  Other Topics Concern   Not on file  Social History Narrative   Lives at home with wife.   Social Determinants of Health   Financial Resource Strain: Low Risk    Difficulty of Paying Living Expenses: Not hard at all  Food Insecurity: No Food Insecurity   Worried About Charity fundraiser in the Last Year: Never true   [place] in the Last Year: Never true  Transportation Needs: No Transportation Needs   Lack of Transportation (Medical): No   Lack of Transportation (Non-Medical): No  Physical Activity: Insufficiently Active   Days of Exercise per Week: 2 days   Minutes of Exercise per Session: 40 min  Stress: No Stress Concern Present   Feeling of Stress : Not at all  Social Connections: Moderately Integrated   Frequency of Communication with Friends and Family: More than three times a week   Frequency of Social Gatherings with Friends and Family: More than three times a week   Attends Religious Services: Never   Marine scientist or Organizations: Yes   Attends Music therapist: More than 4 times per year   Marital Status: Married    Tobacco Counseling Counseling given: Not Answered Tobacco comments: quit cigarettes in 1973   Clinical Intake:  Pre-visit preparation completed: Yes  Pain : No/denies pain     Nutritional Risks: None Diabetes: No  How often do you need to have someone help  you when you read instructions, pamphlets, or other written materials from your doctor or pharmacy?: 1 - Never  Diabetic?No   Interpreter Needed?: No  Information entered by :: [place] of Daily Living In your present state of health, do you have any difficulty performing the following activities: 01/11/2021 03/15/2020  Hearing? N N  Vision? N N  Difficulty concentrating or making decisions? N N  Walking or climbing stairs? N N  Dressing or bathing? N N  Doing errands, shopping? N N  Preparing Food and eating ? N -  Using the Toilet? N -  In the past six months, have you accidently leaked urine? N -  Do you have problems with loss of bowel control? N -  Managing your Medications? N -  Managing your Finances? N -  Housekeeping or managing your Housekeeping? N -  Some recent data might be hidden    Patient Care Team: [name], MD as PCP - General (Family Medicine)  Indicate any recent Medical Services you may have received from other than [facility] providers in the past year (date may be approximate).     Assessment:   This is a routine wellness examination for Ryan Turner.  Hearing/Vision screen Vision Screening - Comments:: Patient stated wears glasses for computer use only. Currently gets eye exams once per year.  Dietary issues and exercise activities discussed: Current Exercise Habits: Home exercise routine, Type of exercise: walking, Time (Minutes): 45, Frequency (Times/Week): 2, Weekly Exercise (Minutes/Week): 90, Intensity: Mild, Exercise limited by: None identified   Goals Addressed             This Visit's Progress    Exercise 150 min/wk Moderate Activity         Depression Screen PHQ 2/9 Scores 01/11/2021 04/03/2020 07/14/2019 06/25/2018 06/25/2018 09/22/2017 04/21/2017  PHQ - 2 Score 0 0 0 0 0 0 0    Fall Risk Fall Risk  01/11/2021 04/03/2020 07/14/2019 06/25/2018 06/25/2018  Falls in the past year? 0 0 0 0 0  Number falls in past yr: 0 - - 0 -   Injury with Fall? 0 - - - -  Risk for fall due to : No Fall Risks - - - -  Follow up Falls evaluation completed;Falls prevention discussed - Falls evaluation completed - Falls evaluation completed    FALL RISK PREVENTION PERTAINING TO THE HOME:  Any stairs in or around the home? No  If so, are there any without handrails? No  Home free of loose throw rugs in walkways, pet beds, electrical cords, etc? Yes  Adequate lighting in your home to reduce risk of falls? Yes   ASSISTIVE DEVICES UTILIZED TO PREVENT FALLS:  Life alert? No  Use of a cane, walker or w/c? No  Grab bars in the bathroom? Yes  Shower chair or bench in shower? Yes  Elevated toilet seat or a handicapped toilet? Yes   TIMED UP AND GO:  Was the test performed? Yes .  Length of time to ambulate 10 feet: 3 sec.   Gait steady and fast without use of assistive device  Cognitive Function:        Immunizations Immunization History  Administered Date(s) Administered   Fluad Quad(high Dose 65+) 04/12/2020   Influenza, High Dose Seasonal PF 04/17/2018, 05/19/2019, 05/22/2019   Influenza-Unspecified 03/22/2015, 03/01/2017   Pneumococcal Conjugate-13 01/21/2015   Pneumococcal Polysaccharide-23 07/22/2013   Tdap 10/23/2015   Zoster Recombinat (Shingrix) 05/22/2019, 09/25/2019   Zoster, Live 07/22/2013    TDAP status: Up to date  Flu Vaccine status: Up to date  Pneumococcal vaccine status: Up to date  Covid-19 vaccine status: Declined, Education has been provided regarding the importance of this vaccine but patient still declined. Advised may receive this vaccine at local pharmacy or Health Dept.or vaccine clinic. Aware to provide a copy of the vaccination record if obtained from local pharmacy or Health Dept. Verbalized acceptance and understanding.  Qualifies for Shingles Vaccine?  Yes   Zostavax completed Yes   Shingrix Completed?: Yes  Screening Tests Health Maintenance  Topic Date Due   COVID-19 Vaccine  (1) Never done   PNA vac Low Risk Adult (2 of 2 - PPSV23) 07/22/2018   INFLUENZA VACCINE  02/19/2021   TETANUS/TDAP  10/22/2025   COLONOSCOPY (Pts 45-12yrs Insurance coverage will need to be confirmed)  02/15/2029   Hepatitis C Screening  Completed   Zoster Vaccines- Shingrix  Completed   HPV VACCINES  Aged Out    Health Maintenance  Health Maintenance Due  Topic Date Due   COVID-19 Vaccine (1) Never done   PNA vac Low Risk Adult (2 of 2 - PPSV23) 07/22/2018    Colorectal cancer screening: Type of screening: Colonoscopy. Completed 02/16/2019. Repeat every 10 years  Lung Cancer Screening: (Low Dose CT Chest recommended if Age 66-80 years, 30 pack-year currently smoking OR have quit w/in 15years.) does not qualify.   Lung Cancer Screening Referral: N/A   Additional Screening:  Hepatitis C Screening: does qualify; Completed 10/23/2015  Vision Screening: Recommended annual ophthalmology exams for early detection of glaucoma and other disorders of the eye. Is the patient up to date with their annual eye exam?  Yes  Who is the provider or what is the name of the office in which the patient attends annual eye exams? Dr. Jorja Loa If pt is not established with a provider, would they like to be referred to a provider to establish care? No .   Dental Screening: Recommended annual dental exams for proper oral hygiene  Community Resource Referral / Chronic Care Management: CRR required this visit?  No   CCM required this visit?  No      Plan:     I have personally reviewed and noted the following in the patient's chart:   Medical and social history Use of alcohol, tobacco or illicit drugs  Current medications and supplements including opioid prescriptions. Patient is not currently taking opioid prescriptions. Functional ability and status Nutritional status Physical activity Advanced directives List of other physicians Hospitalizations, surgeries, and ER visits in previous 12  months Vitals Screenings to include cognitive, depression, and falls Referrals and appointments  In addition, I have reviewed and discussed with patient certain preventive protocols, quality metrics, and best practice recommendations. A written personalized care plan for preventive services as well as general preventive health recommendations were provided to patient.     Ofilia Neas, LPN   [id number]/[id number]   Nurse Notes: None

## 2021-01-23 DIAGNOSIS — M5136 Other intervertebral disc degeneration, lumbar region: Secondary | ICD-10-CM | POA: Diagnosis not present

## 2021-01-23 DIAGNOSIS — M62830 Muscle spasm of back: Secondary | ICD-10-CM | POA: Diagnosis not present

## 2021-01-23 DIAGNOSIS — M9904 Segmental and somatic dysfunction of sacral region: Secondary | ICD-10-CM | POA: Diagnosis not present

## 2021-01-23 DIAGNOSIS — M9903 Segmental and somatic dysfunction of lumbar region: Secondary | ICD-10-CM | POA: Diagnosis not present

## 2021-02-06 DIAGNOSIS — M9904 Segmental and somatic dysfunction of sacral region: Secondary | ICD-10-CM | POA: Diagnosis not present

## 2021-02-06 DIAGNOSIS — M9903 Segmental and somatic dysfunction of lumbar region: Secondary | ICD-10-CM | POA: Diagnosis not present

## 2021-02-06 DIAGNOSIS — M62830 Muscle spasm of back: Secondary | ICD-10-CM | POA: Diagnosis not present

## 2021-02-06 DIAGNOSIS — M5136 Other intervertebral disc degeneration, lumbar region: Secondary | ICD-10-CM | POA: Diagnosis not present

## 2021-02-20 DIAGNOSIS — M5136 Other intervertebral disc degeneration, lumbar region: Secondary | ICD-10-CM | POA: Diagnosis not present

## 2021-02-20 DIAGNOSIS — M9904 Segmental and somatic dysfunction of sacral region: Secondary | ICD-10-CM | POA: Diagnosis not present

## 2021-02-20 DIAGNOSIS — M62830 Muscle spasm of back: Secondary | ICD-10-CM | POA: Diagnosis not present

## 2021-02-20 DIAGNOSIS — M9903 Segmental and somatic dysfunction of lumbar region: Secondary | ICD-10-CM | POA: Diagnosis not present

## 2021-03-06 DIAGNOSIS — M62830 Muscle spasm of back: Secondary | ICD-10-CM | POA: Diagnosis not present

## 2021-03-06 DIAGNOSIS — M5136 Other intervertebral disc degeneration, lumbar region: Secondary | ICD-10-CM | POA: Diagnosis not present

## 2021-03-06 DIAGNOSIS — M9904 Segmental and somatic dysfunction of sacral region: Secondary | ICD-10-CM | POA: Diagnosis not present

## 2021-03-06 DIAGNOSIS — M9903 Segmental and somatic dysfunction of lumbar region: Secondary | ICD-10-CM | POA: Diagnosis not present

## 2021-03-20 DIAGNOSIS — M5136 Other intervertebral disc degeneration, lumbar region: Secondary | ICD-10-CM | POA: Diagnosis not present

## 2021-03-20 DIAGNOSIS — M9903 Segmental and somatic dysfunction of lumbar region: Secondary | ICD-10-CM | POA: Diagnosis not present

## 2021-03-20 DIAGNOSIS — M9904 Segmental and somatic dysfunction of sacral region: Secondary | ICD-10-CM | POA: Diagnosis not present

## 2021-03-20 DIAGNOSIS — M62830 Muscle spasm of back: Secondary | ICD-10-CM | POA: Diagnosis not present

## 2021-04-03 DIAGNOSIS — M5136 Other intervertebral disc degeneration, lumbar region: Secondary | ICD-10-CM | POA: Diagnosis not present

## 2021-04-03 DIAGNOSIS — M62830 Muscle spasm of back: Secondary | ICD-10-CM | POA: Diagnosis not present

## 2021-04-03 DIAGNOSIS — M9903 Segmental and somatic dysfunction of lumbar region: Secondary | ICD-10-CM | POA: Diagnosis not present

## 2021-04-03 DIAGNOSIS — M9904 Segmental and somatic dysfunction of sacral region: Secondary | ICD-10-CM | POA: Diagnosis not present

## 2021-04-12 DIAGNOSIS — B353 Tinea pedis: Secondary | ICD-10-CM | POA: Diagnosis not present

## 2021-04-12 DIAGNOSIS — D225 Melanocytic nevi of trunk: Secondary | ICD-10-CM | POA: Diagnosis not present

## 2021-04-12 DIAGNOSIS — L814 Other melanin hyperpigmentation: Secondary | ICD-10-CM | POA: Diagnosis not present

## 2021-04-12 DIAGNOSIS — Z08 Encounter for follow-up examination after completed treatment for malignant neoplasm: Secondary | ICD-10-CM | POA: Diagnosis not present

## 2021-04-12 DIAGNOSIS — L821 Other seborrheic keratosis: Secondary | ICD-10-CM | POA: Diagnosis not present

## 2021-04-12 DIAGNOSIS — Z86007 Personal history of in-situ neoplasm of skin: Secondary | ICD-10-CM | POA: Diagnosis not present

## 2021-04-17 DIAGNOSIS — M62830 Muscle spasm of back: Secondary | ICD-10-CM | POA: Diagnosis not present

## 2021-04-17 DIAGNOSIS — M9903 Segmental and somatic dysfunction of lumbar region: Secondary | ICD-10-CM | POA: Diagnosis not present

## 2021-04-17 DIAGNOSIS — M9904 Segmental and somatic dysfunction of sacral region: Secondary | ICD-10-CM | POA: Diagnosis not present

## 2021-04-17 DIAGNOSIS — M5136 Other intervertebral disc degeneration, lumbar region: Secondary | ICD-10-CM | POA: Diagnosis not present

## 2021-04-27 ENCOUNTER — Encounter: Payer: Self-pay | Admitting: Family Medicine

## 2021-04-27 ENCOUNTER — Other Ambulatory Visit: Payer: Self-pay | Admitting: Family Medicine

## 2021-05-01 DIAGNOSIS — M5136 Other intervertebral disc degeneration, lumbar region: Secondary | ICD-10-CM | POA: Diagnosis not present

## 2021-05-01 DIAGNOSIS — M9904 Segmental and somatic dysfunction of sacral region: Secondary | ICD-10-CM | POA: Diagnosis not present

## 2021-05-01 DIAGNOSIS — M9903 Segmental and somatic dysfunction of lumbar region: Secondary | ICD-10-CM | POA: Diagnosis not present

## 2021-05-01 DIAGNOSIS — M62830 Muscle spasm of back: Secondary | ICD-10-CM | POA: Diagnosis not present

## 2021-05-15 DIAGNOSIS — M62830 Muscle spasm of back: Secondary | ICD-10-CM | POA: Diagnosis not present

## 2021-05-15 DIAGNOSIS — M9904 Segmental and somatic dysfunction of sacral region: Secondary | ICD-10-CM | POA: Diagnosis not present

## 2021-05-15 DIAGNOSIS — M9903 Segmental and somatic dysfunction of lumbar region: Secondary | ICD-10-CM | POA: Diagnosis not present

## 2021-05-15 DIAGNOSIS — M5136 Other intervertebral disc degeneration, lumbar region: Secondary | ICD-10-CM | POA: Diagnosis not present

## 2021-05-28 ENCOUNTER — Ambulatory Visit: Payer: PPO | Admitting: Family Medicine

## 2021-05-29 DIAGNOSIS — M62830 Muscle spasm of back: Secondary | ICD-10-CM | POA: Diagnosis not present

## 2021-05-29 DIAGNOSIS — M9904 Segmental and somatic dysfunction of sacral region: Secondary | ICD-10-CM | POA: Diagnosis not present

## 2021-05-29 DIAGNOSIS — M5136 Other intervertebral disc degeneration, lumbar region: Secondary | ICD-10-CM | POA: Diagnosis not present

## 2021-05-29 DIAGNOSIS — M9903 Segmental and somatic dysfunction of lumbar region: Secondary | ICD-10-CM | POA: Diagnosis not present

## 2021-06-07 ENCOUNTER — Other Ambulatory Visit: Payer: PPO

## 2021-06-07 ENCOUNTER — Other Ambulatory Visit: Payer: Self-pay

## 2021-06-07 DIAGNOSIS — E039 Hypothyroidism, unspecified: Secondary | ICD-10-CM | POA: Diagnosis not present

## 2021-06-07 DIAGNOSIS — Z13220 Encounter for screening for lipoid disorders: Secondary | ICD-10-CM

## 2021-06-07 DIAGNOSIS — I10 Essential (primary) hypertension: Secondary | ICD-10-CM

## 2021-06-07 DIAGNOSIS — Z136 Encounter for screening for cardiovascular disorders: Secondary | ICD-10-CM | POA: Diagnosis not present

## 2021-06-08 LAB — CBC WITH DIFFERENTIAL/PLATELET
Absolute Monocytes: 441 cells/uL (ref 200–950)
Basophils Absolute: 39 cells/uL (ref 0–200)
Basophils Relative: 0.8 %
Eosinophils Absolute: 113 cells/uL (ref 15–500)
Eosinophils Relative: 2.3 %
HCT: 47.9 % (ref 38.5–50.0)
Hemoglobin: 16.2 g/dL (ref 13.2–17.1)
Lymphs Abs: 1416 cells/uL (ref 850–3900)
MCH: 31.9 pg (ref 27.0–33.0)
MCHC: 33.8 g/dL (ref 32.0–36.0)
MCV: 94.3 fL (ref 80.0–100.0)
MPV: 10.2 fL (ref 7.5–12.5)
Monocytes Relative: 9 %
Neutro Abs: 2891 cells/uL (ref 1500–7800)
Neutrophils Relative %: 59 %
Platelets: 168 10*3/uL (ref 140–400)
RBC: 5.08 10*6/uL (ref 4.20–5.80)
RDW: 12.4 % (ref 11.0–15.0)
Total Lymphocyte: 28.9 %
WBC: 4.9 10*3/uL (ref 3.8–10.8)

## 2021-06-08 LAB — LIPID PANEL
Cholesterol: 159 mg/dL (ref <200)
HDL: 40 mg/dL (ref >=40)
LDL Cholesterol (Calc): 99 mg/dL (calc)
Non-HDL Cholesterol (Calc): 119 mg/dL (calc) (ref <130)
Total CHOL/HDL Ratio: 4 (calc) (ref <5.0)
Triglycerides: 103 mg/dL (ref <150)

## 2021-06-08 LAB — COMPLETE METABOLIC PANEL WITH GFR
AG Ratio: 2.5 (calc) (ref 1.0–2.5)
ALT: 22 U/L (ref 9–46)
AST: 17 U/L (ref 10–35)
Albumin: 4.3 g/dL (ref 3.6–5.1)
Alkaline phosphatase (APISO): 51 U/L (ref 35–144)
BUN: 15 mg/dL (ref 7–25)
CO2: 28 mmol/L (ref 20–32)
Calcium: 9.3 mg/dL (ref 8.6–10.3)
Chloride: 105 mmol/L (ref 98–110)
Creat: 0.96 mg/dL (ref 0.70–1.28)
Globulin: 1.7 g/dL (calc) — ABNORMAL LOW (ref 1.9–3.7)
Glucose, Bld: 99 mg/dL (ref 65–99)
Potassium: 5 mmol/L (ref 3.5–5.3)
Sodium: 142 mmol/L (ref 135–146)
Total Bilirubin: 0.8 mg/dL (ref 0.2–1.2)
Total Protein: 6 g/dL — ABNORMAL LOW (ref 6.1–8.1)
eGFR: 84 mL/min/{1.73_m2} (ref >=60)

## 2021-06-08 LAB — TSH: TSH: 3.25 mIU/L (ref 0.40–4.50)

## 2021-06-12 DIAGNOSIS — M62830 Muscle spasm of back: Secondary | ICD-10-CM | POA: Diagnosis not present

## 2021-06-12 DIAGNOSIS — M9904 Segmental and somatic dysfunction of sacral region: Secondary | ICD-10-CM | POA: Diagnosis not present

## 2021-06-12 DIAGNOSIS — M5136 Other intervertebral disc degeneration, lumbar region: Secondary | ICD-10-CM | POA: Diagnosis not present

## 2021-06-12 DIAGNOSIS — M9903 Segmental and somatic dysfunction of lumbar region: Secondary | ICD-10-CM | POA: Diagnosis not present

## 2021-06-18 ENCOUNTER — Encounter: Payer: Self-pay | Admitting: Family Medicine

## 2021-06-18 ENCOUNTER — Ambulatory Visit (INDEPENDENT_AMBULATORY_CARE_PROVIDER_SITE_OTHER): Payer: PPO | Admitting: Family Medicine

## 2021-06-18 ENCOUNTER — Other Ambulatory Visit: Payer: Self-pay

## 2021-06-18 VITALS — BP 138/82 | HR 82 | Temp 97.5°F | Resp 18 | Ht 73.0 in | Wt 260.0 lb

## 2021-06-18 DIAGNOSIS — Z0000 Encounter for general adult medical examination without abnormal findings: Secondary | ICD-10-CM

## 2021-06-18 DIAGNOSIS — Z86718 Personal history of other venous thrombosis and embolism: Secondary | ICD-10-CM | POA: Diagnosis not present

## 2021-06-18 NOTE — Progress Notes (Signed)
Subjective:    Patient ID: Ryan Turner, male    DOB: 06-01-1951, 70 y.o.   MRN: [id number]  HPI  Patient is a 70 year old Caucasian gentleman here today for complete physical exam.  Past medical history is significant for COVID, DVT, a pleural effusion that resulted in an empyema after COVID.  However he is recovered well from all of this and is now back to his normal state of health.  His last colonoscopy was in 2020 and was significant for several tubular adenomas.  They recommended a repeat colonoscopy in 2023.  His PSA was checked in March of this year and was normal at 0.35.  Otherwise he is doing well with no concerns.  He is due for a flu shot which he refuses.  He is due for a COVID-vaccine which he refuses due to concerns about possible "contamination" from the drug companies.  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 90 capsule 2   EC-RX  Testosterone 20 % CREA Apply 20 % topically.     Glucosamine-Chondroit-Vit C-Mn (GLUCOSAMINE CHONDROITIN COMPLX) CAPS Take 2 capsules by mouth daily. 1800 mg     hyoscyamine (LEVSIN SL) 0.125 MG SL tablet PLACE 1 TABLET UNDER THE TONGUE 4 (FOUR) TIMES DAILY AS NEEDED. 360 tablet 1   ketoconazole (NIZORAL) 2 % cream Apply topically 2 (two) times daily.     Multiple Vitamins-Minerals (OCUVITE PRESERVISION PO) Take 2 tablets by mouth daily.      Omega-3 Fatty Acids (FISH OIL) 1200 MG CAPS Take 2 capsules by mouth daily.     zolpidem (AMBIEN) 10 MG tablet TAKE 0.5-1 TABLETS (5-10 MG TOTAL) BY MOUTH AT BEDTIME AS NEEDED. FOR SLEEP 30 tablet 3   No current facility-administered medications on file prior to visit.   Allergies  Allergen Reactions   Morphine     Other reaction(s): VOMITING   Social History   Socioeconomic History   Marital status: Married    Spouse name: Not on file   Number of children: Not on file   Years of education: College   Highest education level: Not on file  Occupational History   Occupation: Broker  Tobacco Use   Smoking status: Former    Types: Cigars    Quit date: 01/28/2020    Years since quitting: 1.3   Smokeless tobacco:  Never   Tobacco comments:    quit cigarettes in 1973  Vaping Use   Vaping Use: Never used  Substance and Sexual Activity   Alcohol use: Yes    Alcohol/week: 6.0 standard drinks    Types: 2 Cans of beer, 4 Shots of liquor per week   Drug use: No   Sexual activity: Yes    Birth control/protection: None  Other Topics Concern   Not on file  Social History Narrative   Lives at home with wife.   Social Determinants of Health   Financial Resource Strain: Low Risk    Difficulty of Paying Living Expenses: Not hard at all  Food Insecurity: No Food Insecurity   Worried About Charity fundraiser in the Last Year: Never true   [place] in the Last Year: Never true  Transportation Needs: No Transportation Needs   Lack of  Transportation (Medical): No   Lack of Transportation (Non-Medical): No  Physical Activity: Insufficiently Active   Days of Exercise per Week: 2 days   Minutes of Exercise per Session: 40 min  Stress: No Stress Concern Present   Feeling of Stress : Not at all  Social Connections: Moderately Integrated   Frequency of Communication with Friends and Family: More than three times a week   Frequency of Social Gatherings with Friends and Family: More than three times a week   Attends Religious Services: Never   Marine scientist or Organizations: Yes   Attends Music therapist: More than 4 times per year   Marital Status: Married  Human resources officer Violence: Not At Risk   Fear of Current or Ex-Partner: No   Emotionally Abused: No   Physically Abused: No   Sexually Abused: No   Family History  Problem Relation Age of Onset   Cancer Mother        lung (unknown primary)   Cancer Father        lung mets to spine   Cancer Brother        melanoma   Leukemia Maternal Grandfather    Stomach cancer Paternal Grandmother    Colon cancer Neg Hx    Colon polyps Neg Hx    Diabetes Neg Hx    Esophageal cancer Neg Hx    Rectal cancer Neg Hx      Review of Systems  All other systems reviewed and are negative.     Objective:   Physical Exam Vitals reviewed.  Constitutional:      General: He is not in acute distress.    Appearance: He is well-developed. He is not diaphoretic.  HENT:     Head: Normocephalic and atraumatic.     Right Ear: External ear normal.     Left Ear: External ear normal.     Nose: Nose normal.     Mouth/Throat:     Pharynx: No oropharyngeal exudate.  Eyes:     General: No scleral icterus.       Right eye: No discharge.        Left eye: No discharge.     Conjunctiva/sclera: Conjunctivae normal.     Pupils: Pupils are equal, round, and reactive to light.  Neck:     Thyroid: No thyromegaly.     Vascular: No JVD.     Trachea: No tracheal  deviation.  Cardiovascular:     Rate and Rhythm: Normal rate and regular rhythm.     Heart sounds: Normal heart sounds. No  murmur heard.   No friction rub. No gallop.  Pulmonary:     Effort: Pulmonary effort is normal. No respiratory distress.     Breath sounds: Normal breath sounds. No stridor. No wheezing or rales.  Chest:     Chest wall: No tenderness.  Abdominal:     General: Bowel sounds are normal. There is no distension.     Palpations: Abdomen is soft. There is no mass.     Tenderness: There is no abdominal tenderness. There is no guarding or rebound.  Musculoskeletal:        General: No tenderness. Normal range of motion.     Cervical back: Normal range of motion and neck supple.  Lymphadenopathy:     Cervical: No cervical adenopathy.  Skin:    General: Skin is warm.     Coloration: Skin is not pale.     Findings: No erythema or rash.  Neurological:     Mental Status: He is alert and oriented to person, place, and time.     Cranial Nerves: No cranial nerve deficit.     Motor: No abnormal muscle tone.     Coordination: Coordination normal.     Deep Tendon Reflexes: Reflexes are normal and symmetric.  Psychiatric:        Behavior: Behavior normal.        Thought Content: Thought content normal.        Judgment: Judgment normal.         Assessment & Plan:  Encounter for Medicare annual wellness exam  History of DVT in adulthood Physical exam today is normal aside from his weight.  Encouraged 30 minutes a day 5 days a week of aerobic exercise.  His most recent lab work is listed below: Lab on 06/07/2021  Component Date Value Ref Range Status   WBC 06/07/2021 4.9  3.8 - 10.8 Thousand/uL Final   RBC 06/07/2021 5.08  4.20 - 5.80 Million/uL Final   Hemoglobin 06/07/2021 16.2  13.2 - 17.1 g/dL Final   HCT 06/07/2021 47.9  38.5 - 50.0 % Final   MCV 06/07/2021 94.3  80.0 - 100.0 fL Final   MCH 06/07/2021 31.9  27.0 - 33.0 pg Final   MCHC 06/07/2021 33.8  32.0 - 36.0  g/dL Final   RDW 06/07/2021 12.4  11.0 - 15.0 % Final   Platelets 06/07/2021 168  140 - 400 Thousand/uL Final   MPV 06/07/2021 10.2  7.5 - 12.5 fL Final   Neutro Abs 06/07/2021 2,891  1,500 - 7,800 cells/uL Final   Lymphs Abs 06/07/2021 1,416  850 - 3,900 cells/uL Final   Absolute Monocytes 06/07/2021 441  200 - 950 cells/uL Final   Eosinophils Absolute 06/07/2021 113  15 - 500 cells/uL Final   Basophils Absolute 06/07/2021 39  0 - 200 cells/uL Final   Neutrophils Relative % 06/07/2021 59  % Final   Total Lymphocyte 06/07/2021 28.9  % Final   Monocytes Relative 06/07/2021 9.0  % Final   Eosinophils Relative 06/07/2021 2.3  % Final   Basophils Relative 06/07/2021 0.8  % Final   Glucose, Bld 06/07/2021 99  65 - 99 mg/dL Final   Comment: .            Fasting reference interval .    BUN 06/07/2021 15  7 - 25 mg/dL Final   Creat 06/07/2021 0.96  0.70 - 1.28 mg/dL Final   eGFR 06/07/2021 84  > OR = 60 mL/min/1.45m Final   Comment: The eGFR  is based on the CKD-EPI 2021 equation. To calculate  the new eGFR from a previous Creatinine or Cystatin C result, go to https://www.kidney.org/professionals/ kdoqi/gfr%5Fcalculator    BUN/Creatinine Ratio 27/12/2374 NOT APPLICABLE  6 - 22 (calc) Final   Sodium 06/07/2021 142  135 - 146 mmol/L Final   Potassium 06/07/2021 5.0  3.5 - 5.3 mmol/L Final   Chloride 06/07/2021 105  98 - 110 mmol/L Final   CO2 06/07/2021 28  20 - 32 mmol/L Final   Calcium 06/07/2021 9.3  8.6 - 10.3 mg/dL Final   Total Protein 06/07/2021 6.0 (L)  6.1 - 8.1 g/dL Final   Albumin 06/07/2021 4.3  3.6 - 5.1 g/dL Final   Globulin 06/07/2021 1.7 (L)  1.9 - 3.7 g/dL (calc) Final   AG Ratio 06/07/2021 2.5  1.0 - 2.5 (calc) Final   Total Bilirubin 06/07/2021 0.8  0.2 - 1.2 mg/dL Final   Alkaline phosphatase (APISO) 06/07/2021 51  35 - 144 U/L Final   AST 06/07/2021 17  10 - 35 U/L Final   ALT 06/07/2021 22  9 - 46 U/L Final   Cholesterol 06/07/2021 159  <200 mg/dL Final   HDL  06/07/2021 40  > OR = 40 mg/dL Final   Triglycerides 06/07/2021 103  <150 mg/dL Final   LDL Cholesterol (Calc) 06/07/2021 99  mg/dL (calc) Final   Comment: Reference range: <100 . Desirable range <100 mg/dL for primary prevention;   <70 mg/dL for patients with CHD or diabetic patients  with > or = 2 CHD risk factors. Marland Kitchen LDL-C is now calculated using the Martin-Hopkins  calculation, which is a validated novel method providing  better accuracy than the Friedewald equation in the  estimation of LDL-C.  Cresenciano Genre et al. Annamaria Helling. 2831;517(61): 2061-2068  (http://education.[facility].com/faq/FAQ164)    Total CHOL/HDL Ratio 06/07/2021 4.0  <5.0 (calc) Final   Non-HDL Cholesterol (Calc) 06/07/2021 119  <130 mg/dL (calc) Final   Comment: For patients with diabetes plus 1 major ASCVD risk  factor, treating to a non-HDL-C goal of <100 mg/dL  (LDL-C of <70 mg/dL) is considered a therapeutic  option.    TSH 06/07/2021 3.25  0.40 - 4.50 mIU/L Final   Labs are unremarkable except for a low protein level.  PSA was checked in the spring and was normal.  Colonoscopy is due next year.  He refuses a flu shot and COVID-vaccine.

## 2021-06-26 DIAGNOSIS — M5136 Other intervertebral disc degeneration, lumbar region: Secondary | ICD-10-CM | POA: Diagnosis not present

## 2021-06-26 DIAGNOSIS — M62830 Muscle spasm of back: Secondary | ICD-10-CM | POA: Diagnosis not present

## 2021-06-26 DIAGNOSIS — M9904 Segmental and somatic dysfunction of sacral region: Secondary | ICD-10-CM | POA: Diagnosis not present

## 2021-06-26 DIAGNOSIS — M9903 Segmental and somatic dysfunction of lumbar region: Secondary | ICD-10-CM | POA: Diagnosis not present

## 2021-07-10 DIAGNOSIS — M9903 Segmental and somatic dysfunction of lumbar region: Secondary | ICD-10-CM | POA: Diagnosis not present

## 2021-07-10 DIAGNOSIS — M9904 Segmental and somatic dysfunction of sacral region: Secondary | ICD-10-CM | POA: Diagnosis not present

## 2021-07-10 DIAGNOSIS — M62830 Muscle spasm of back: Secondary | ICD-10-CM | POA: Diagnosis not present

## 2021-07-10 DIAGNOSIS — M5136 Other intervertebral disc degeneration, lumbar region: Secondary | ICD-10-CM | POA: Diagnosis not present

## 2021-10-09 DIAGNOSIS — M9903 Segmental and somatic dysfunction of lumbar region: Secondary | ICD-10-CM | POA: Diagnosis not present

## 2021-10-09 DIAGNOSIS — M5136 Other intervertebral disc degeneration, lumbar region: Secondary | ICD-10-CM | POA: Diagnosis not present

## 2021-10-09 DIAGNOSIS — M62830 Muscle spasm of back: Secondary | ICD-10-CM | POA: Diagnosis not present

## 2021-10-09 DIAGNOSIS — M9904 Segmental and somatic dysfunction of sacral region: Secondary | ICD-10-CM | POA: Diagnosis not present

## 2021-10-12 DIAGNOSIS — L821 Other seborrheic keratosis: Secondary | ICD-10-CM | POA: Diagnosis not present

## 2021-10-12 DIAGNOSIS — Z86007 Personal history of in-situ neoplasm of skin: Secondary | ICD-10-CM | POA: Diagnosis not present

## 2021-10-12 DIAGNOSIS — Z85828 Personal history of other malignant neoplasm of skin: Secondary | ICD-10-CM | POA: Diagnosis not present

## 2021-10-12 DIAGNOSIS — L814 Other melanin hyperpigmentation: Secondary | ICD-10-CM | POA: Diagnosis not present

## 2021-10-12 DIAGNOSIS — L570 Actinic keratosis: Secondary | ICD-10-CM | POA: Diagnosis not present

## 2021-10-12 DIAGNOSIS — Z08 Encounter for follow-up examination after completed treatment for malignant neoplasm: Secondary | ICD-10-CM | POA: Diagnosis not present

## 2021-10-12 DIAGNOSIS — D225 Melanocytic nevi of trunk: Secondary | ICD-10-CM | POA: Diagnosis not present

## 2021-10-12 DIAGNOSIS — B353 Tinea pedis: Secondary | ICD-10-CM | POA: Diagnosis not present

## 2021-10-13 ENCOUNTER — Other Ambulatory Visit: Payer: Self-pay | Admitting: Family Medicine

## 2021-10-15 ENCOUNTER — Other Ambulatory Visit: Payer: Self-pay | Admitting: Family Medicine

## 2021-10-15 NOTE — Telephone Encounter (Signed)
LOV 06/18/21 ?Last refill 04/27/21, #30, 3 refills ? ?Please review, thanks! ? ?

## 2021-10-17 ENCOUNTER — Other Ambulatory Visit: Payer: Self-pay

## 2021-10-17 ENCOUNTER — Ambulatory Visit: Payer: PPO | Admitting: Podiatry

## 2021-10-17 DIAGNOSIS — H04123 Dry eye syndrome of bilateral lacrimal glands: Secondary | ICD-10-CM | POA: Diagnosis not present

## 2021-10-17 DIAGNOSIS — M722 Plantar fascial fibromatosis: Secondary | ICD-10-CM

## 2021-10-17 DIAGNOSIS — L600 Ingrowing nail: Secondary | ICD-10-CM | POA: Diagnosis not present

## 2021-10-17 NOTE — Patient Instructions (Addendum)
Place 1/4 cup of epsom salts in a quart of warm tap water.  Submerge your foot or feet in the solution and soak for 20 minutes.  This soak should be done twice a day.  Next, remove your foot or feet from solution, blot dry the affected area. Apply ointment and cover if instructed by your doctor.  ? ?IF YOUR SKIN BECOMES IRRITATED WHILE USING THESE INSTRUCTIONS, IT IS OKAY TO SWITCH TO  WHITE VINEGAR AND WATER.  ?As another alternative soak, you may use antibacterial soap and water. ? ?Monitor for any signs/symptoms of infection. Call the office immediately if any occur or go directly to the emergency room. Call with any questions/concerns. Plantar Fasciitis (Heel Spur Syndrome) ?with Rehab ?The plantar fascia is a fibrous, ligament-like, soft-tissue structure that spans the bottom of the foot. Plantar fasciitis is a condition that causes pain in the foot due to inflammation of the tissue. ?SYMPTOMS  ?Pain and tenderness on the underneath side of the foot. ?Pain that worsens with standing or walking. ?CAUSES  ?Plantar fasciitis is caused by irritation and injury to the plantar fascia on the underneath side of the foot. Common mechanisms of injury include: ?Direct trauma to bottom of the foot. ?Damage to a small nerve that runs under the foot where the main fascia attaches to the heel bone. ?Stress placed on the plantar fascia due to bone spurs. ?RISK INCREASES WITH:  ?Activities that place stress on the plantar fascia (running, jumping, pivoting, or cutting). ?Poor strength and flexibility. ?Improperly fitted shoes. ?Tight calf muscles. ?Flat feet. ?Failure to warm-up properly before activity. ?Obesity. ?PREVENTION ?Warm up and stretch properly before activity. ?Allow for adequate recovery between workouts. ?Maintain physical fitness: ?Strength, flexibility, and endurance. ?Cardiovascular fitness. ?Maintain a health body weight. ?Avoid stress on the plantar fascia. ?Wear properly fitted shoes, including arch supports  for individuals who have flat feet. ? ?PROGNOSIS  ?If treated properly, then the symptoms of plantar fasciitis usually resolve without surgery. However, occasionally surgery is necessary. ? ?RELATED COMPLICATIONS  ?Recurrent symptoms that may result in a chronic condition. ?Problems of the lower back that are caused by compensating for the injury, such as limping. ?Pain or weakness of the foot during push-off following surgery. ?Chronic inflammation, scarring, and partial or complete fascia tear, occurring more often from repeated injections. ? ?TREATMENT  ?Treatment initially involves the use of ice and medication to help reduce pain and inflammation. The use of strengthening and stretching exercises may help reduce pain with activity, especially stretches of the Achilles tendon. These exercises may be performed at home or with a therapist. Your caregiver may recommend that you use heel cups of arch supports to help reduce stress on the plantar fascia. Occasionally, corticosteroid injections are given to reduce inflammation. If symptoms persist for greater than 6 months despite non-surgical (conservative), then surgery may be recommended.  ? ?MEDICATION  ?If pain medication is necessary, then nonsteroidal anti-inflammatory medications, such as aspirin and ibuprofen, or other minor pain relievers, such as acetaminophen, are often recommended. ?Do not take pain medication within 7 days before surgery. ?Prescription pain relievers may be given if deemed necessary by your caregiver. Use only as directed and only as much as you need. ?Corticosteroid injections may be given by your caregiver. These injections should be reserved for the most serious cases, because they may only be given a certain number of times. ? ?HEAT AND COLD ?Cold treatment (icing) relieves pain and reduces inflammation. Cold treatment should be applied for 10  to 15 minutes every 2 to 3 hours for inflammation and pain and immediately after any  activity that aggravates your symptoms. Use ice packs or massage the area with a piece of ice (ice massage). ?Heat treatment may be used prior to performing the stretching and strengthening activities prescribed by your caregiver, physical therapist, or athletic trainer. Use a heat pack or soak the injury in warm water. ? ?SEEK IMMEDIATE MEDICAL CARE IF: ?Treatment seems to offer no benefit, or the condition worsens. ?Any medications produce adverse side effects. ? ?EXERCISES- ?RANGE OF MOTION (ROM) AND STRETCHING EXERCISES - Plantar Fasciitis (Heel Spur Syndrome) ?These exercises may help you when beginning to rehabilitate your injury. Your symptoms may resolve with or without further involvement from your physician, physical therapist or athletic trainer. While completing these exercises, remember:  ?Restoring tissue flexibility helps normal motion to return to the joints. This allows healthier, less painful movement and activity. ?An effective stretch should be held for at least 30 seconds. ?A stretch should never be painful. You should only feel a gentle lengthening or release in the stretched tissue. ? ?RANGE OF MOTION - Toe Extension, Flexion ?Sit with your right / left leg crossed over your opposite knee. ?Grasp your toes and gently pull them back toward the top of your foot. You should feel a stretch on the bottom of your toes and/or foot. ?Hold this stretch for 10 seconds. ?Now, gently pull your toes toward the bottom of your foot. You should feel a stretch on the top of your toes and or foot. ?Hold this stretch for 10 seconds. ?Repeat  times. Complete this stretch 3 times per day.  ? ?RANGE OF MOTION - Ankle Dorsiflexion, Active Assisted ?Remove shoes and sit on a chair that is preferably not on a carpeted surface. ?Place right / left foot under knee. Extend your opposite leg for support. ?Keeping your heel down, slide your right / left foot back toward the chair until you feel a stretch at your ankle or  calf. If you do not feel a stretch, slide your bottom forward to the edge of the chair, while still keeping your heel down. ?Hold this stretch for 10 seconds. ?Repeat 3 times. Complete this stretch 2 times per day.  ? ?STRETCH  Gastroc, Standing ?Place hands on wall. ?Extend right / left leg, keeping the front knee somewhat bent. ?Slightly point your toes inward on your back foot. ?Keeping your right / left heel on the floor and your knee straight, shift your weight toward the wall, not allowing your back to arch. ?You should feel a gentle stretch in the right / left calf. Hold this position for 10 seconds. ?Repeat 3 times. Complete this stretch 2 times per day. ? ?STRETCH  Soleus, Standing ?Place hands on wall. ?Extend right / left leg, keeping the other knee somewhat bent. ?Slightly point your toes inward on your back foot. ?Keep your right / left heel on the floor, bend your back knee, and slightly shift your weight over the back leg so that you feel a gentle stretch deep in your back calf. ?Hold this position for 10 seconds. ?Repeat 3 times. Complete this stretch 2 times per day. ? ?Google, Standing  ?Note: This exercise can place a lot of stress on your foot and ankle. Please complete this exercise only if specifically instructed by your caregiver.  ?Place the ball of your right / left foot on a step, keeping your other foot firmly on the same step. ?  Hold on to the wall or a rail for balance. ?Slowly lift your other foot, allowing your body weight to press your heel down over the edge of the step. ?You should feel a stretch in your right / left calf. ?Hold this position for 10 seconds. ?Repeat this exercise with a slight bend in your right / left knee. ?Repeat 3 times. Complete this stretch 2 times per day.  ? ?STRENGTHENING EXERCISES - Plantar Fasciitis (Heel Spur Syndrome)  ?These exercises may help you when beginning to rehabilitate your injury. They may resolve your symptoms with or without  further involvement from your physician, physical therapist or athletic trainer. While completing these exercises, remember:  ?Muscles can gain both the endurance and the strength needed for everyday activi

## 2021-10-17 NOTE — Progress Notes (Signed)
Subjective:  ? ?Patient ID: Ryan Turner, male   DOB: 71 y.o.   MRN: [id number]  ? ?HPI ?Patient presents with chronic ingrown toenail deformity right over left stating its been sore and has tried to trim them himself and soak.  Also complains of some discomfort in the heel that is been under reasonably good control and does not smoke likes to be active ? ? ?Review of Systems  ?All other systems reviewed and are negative. ? ? ?   ?Objective:  ?Physical Exam ?Vitals and nursing note reviewed.  ?Constitutional:   ?   Appearance: He is well-developed.  ?Pulmonary:  ?   Effort: Pulmonary effort is normal.  ?Musculoskeletal:     ?   General: Normal range of motion.  ?Skin: ?   General: Skin is warm.  ?Neurological:  ?   Mental Status: He is alert.  ?  ?Neurovascular status intact muscle strength found to be adequate range of motion within normal limits.  Patient is noted to have incurvated medial border of the hallux and second toe right foot that are painful when pressed and make wearing shoe gear difficult with redness and drainage of a minimal nature noted just discomfort.  Mild discomfort plantar heel ? ?   ?Assessment:  ?Chronic ingrown toenail deformity right hallux and second toe medial borders with no indication of current infection along with mild fascial symptomatology ? ?   ?Plan:  ?H&P reviewed condition and recommended correction of ingrown's in further fashion do not recommend current treatment except for supportive shoes stretch.  I went ahead and allowed him to read then signed consent form understanding risk and I infiltrated the right hallux 6 and second digit with 120 mg Xylocaine Marcaine mixture sterile prep done and using sterile instrumentation remove the medial borders of the right hallux and second toes and exposed matrix applied phenol 3 applications 30 seconds each border followed by alcohol lavage and sterile dressing.  Gave instructions on soaks reappoint to recheck and encouraged him to  leave dressings on 24 hours but call if any throbbing or take dressings off if throbbing were to occur early ?   ? ? ?

## 2021-10-23 DIAGNOSIS — M62830 Muscle spasm of back: Secondary | ICD-10-CM | POA: Diagnosis not present

## 2021-10-23 DIAGNOSIS — M9903 Segmental and somatic dysfunction of lumbar region: Secondary | ICD-10-CM | POA: Diagnosis not present

## 2021-10-23 DIAGNOSIS — M5136 Other intervertebral disc degeneration, lumbar region: Secondary | ICD-10-CM | POA: Diagnosis not present

## 2021-10-23 DIAGNOSIS — M9904 Segmental and somatic dysfunction of sacral region: Secondary | ICD-10-CM | POA: Diagnosis not present

## 2021-11-06 DIAGNOSIS — M9903 Segmental and somatic dysfunction of lumbar region: Secondary | ICD-10-CM | POA: Diagnosis not present

## 2021-11-06 DIAGNOSIS — M9904 Segmental and somatic dysfunction of sacral region: Secondary | ICD-10-CM | POA: Diagnosis not present

## 2021-11-06 DIAGNOSIS — M5136 Other intervertebral disc degeneration, lumbar region: Secondary | ICD-10-CM | POA: Diagnosis not present

## 2021-11-06 DIAGNOSIS — M62830 Muscle spasm of back: Secondary | ICD-10-CM | POA: Diagnosis not present

## 2021-11-20 DIAGNOSIS — M5136 Other intervertebral disc degeneration, lumbar region: Secondary | ICD-10-CM | POA: Diagnosis not present

## 2021-11-20 DIAGNOSIS — M9903 Segmental and somatic dysfunction of lumbar region: Secondary | ICD-10-CM | POA: Diagnosis not present

## 2021-11-20 DIAGNOSIS — M62830 Muscle spasm of back: Secondary | ICD-10-CM | POA: Diagnosis not present

## 2021-11-20 DIAGNOSIS — M9904 Segmental and somatic dysfunction of sacral region: Secondary | ICD-10-CM | POA: Diagnosis not present

## 2021-12-04 DIAGNOSIS — M9903 Segmental and somatic dysfunction of lumbar region: Secondary | ICD-10-CM | POA: Diagnosis not present

## 2021-12-04 DIAGNOSIS — M5136 Other intervertebral disc degeneration, lumbar region: Secondary | ICD-10-CM | POA: Diagnosis not present

## 2021-12-04 DIAGNOSIS — M62830 Muscle spasm of back: Secondary | ICD-10-CM | POA: Diagnosis not present

## 2021-12-04 DIAGNOSIS — M9904 Segmental and somatic dysfunction of sacral region: Secondary | ICD-10-CM | POA: Diagnosis not present

## 2021-12-18 DIAGNOSIS — M9904 Segmental and somatic dysfunction of sacral region: Secondary | ICD-10-CM | POA: Diagnosis not present

## 2021-12-18 DIAGNOSIS — M62830 Muscle spasm of back: Secondary | ICD-10-CM | POA: Diagnosis not present

## 2021-12-18 DIAGNOSIS — M9903 Segmental and somatic dysfunction of lumbar region: Secondary | ICD-10-CM | POA: Diagnosis not present

## 2021-12-18 DIAGNOSIS — M5136 Other intervertebral disc degeneration, lumbar region: Secondary | ICD-10-CM | POA: Diagnosis not present

## 2022-01-01 DIAGNOSIS — M5136 Other intervertebral disc degeneration, lumbar region: Secondary | ICD-10-CM | POA: Diagnosis not present

## 2022-01-01 DIAGNOSIS — M9904 Segmental and somatic dysfunction of sacral region: Secondary | ICD-10-CM | POA: Diagnosis not present

## 2022-01-01 DIAGNOSIS — M9903 Segmental and somatic dysfunction of lumbar region: Secondary | ICD-10-CM | POA: Diagnosis not present

## 2022-01-01 DIAGNOSIS — M62830 Muscle spasm of back: Secondary | ICD-10-CM | POA: Diagnosis not present

## 2022-01-15 DIAGNOSIS — M62830 Muscle spasm of back: Secondary | ICD-10-CM | POA: Diagnosis not present

## 2022-01-15 DIAGNOSIS — M5136 Other intervertebral disc degeneration, lumbar region: Secondary | ICD-10-CM | POA: Diagnosis not present

## 2022-01-15 DIAGNOSIS — M9904 Segmental and somatic dysfunction of sacral region: Secondary | ICD-10-CM | POA: Diagnosis not present

## 2022-01-15 DIAGNOSIS — M9903 Segmental and somatic dysfunction of lumbar region: Secondary | ICD-10-CM | POA: Diagnosis not present

## 2022-01-29 DIAGNOSIS — M9904 Segmental and somatic dysfunction of sacral region: Secondary | ICD-10-CM | POA: Diagnosis not present

## 2022-01-29 DIAGNOSIS — M9903 Segmental and somatic dysfunction of lumbar region: Secondary | ICD-10-CM | POA: Diagnosis not present

## 2022-01-29 DIAGNOSIS — M62830 Muscle spasm of back: Secondary | ICD-10-CM | POA: Diagnosis not present

## 2022-01-29 DIAGNOSIS — M5136 Other intervertebral disc degeneration, lumbar region: Secondary | ICD-10-CM | POA: Diagnosis not present

## 2022-02-12 DIAGNOSIS — M9903 Segmental and somatic dysfunction of lumbar region: Secondary | ICD-10-CM | POA: Diagnosis not present

## 2022-02-12 DIAGNOSIS — M5136 Other intervertebral disc degeneration, lumbar region: Secondary | ICD-10-CM | POA: Diagnosis not present

## 2022-02-12 DIAGNOSIS — M62830 Muscle spasm of back: Secondary | ICD-10-CM | POA: Diagnosis not present

## 2022-02-12 DIAGNOSIS — M9904 Segmental and somatic dysfunction of sacral region: Secondary | ICD-10-CM | POA: Diagnosis not present

## 2022-02-19 ENCOUNTER — Other Ambulatory Visit: Payer: Self-pay | Admitting: Family Medicine

## 2022-02-20 NOTE — Telephone Encounter (Signed)
LOV 06/18/21 Last refill 10/15/21, #30, 2 refills  Please review, thanks!

## 2022-02-20 NOTE — Telephone Encounter (Signed)
Pls pt for CPE /lab for future refills on meds

## 2022-02-26 ENCOUNTER — Encounter: Payer: Self-pay | Admitting: Gastroenterology

## 2022-02-26 DIAGNOSIS — M62830 Muscle spasm of back: Secondary | ICD-10-CM | POA: Diagnosis not present

## 2022-02-26 DIAGNOSIS — M9903 Segmental and somatic dysfunction of lumbar region: Secondary | ICD-10-CM | POA: Diagnosis not present

## 2022-02-26 DIAGNOSIS — M5136 Other intervertebral disc degeneration, lumbar region: Secondary | ICD-10-CM | POA: Diagnosis not present

## 2022-02-26 DIAGNOSIS — M9904 Segmental and somatic dysfunction of sacral region: Secondary | ICD-10-CM | POA: Diagnosis not present

## 2022-03-01 ENCOUNTER — Encounter: Payer: Self-pay | Admitting: Gastroenterology

## 2022-03-12 DIAGNOSIS — M9904 Segmental and somatic dysfunction of sacral region: Secondary | ICD-10-CM | POA: Diagnosis not present

## 2022-03-12 DIAGNOSIS — M5136 Other intervertebral disc degeneration, lumbar region: Secondary | ICD-10-CM | POA: Diagnosis not present

## 2022-03-12 DIAGNOSIS — M9903 Segmental and somatic dysfunction of lumbar region: Secondary | ICD-10-CM | POA: Diagnosis not present

## 2022-03-12 DIAGNOSIS — M62830 Muscle spasm of back: Secondary | ICD-10-CM | POA: Diagnosis not present

## 2022-03-26 DIAGNOSIS — M9903 Segmental and somatic dysfunction of lumbar region: Secondary | ICD-10-CM | POA: Diagnosis not present

## 2022-03-26 DIAGNOSIS — M9904 Segmental and somatic dysfunction of sacral region: Secondary | ICD-10-CM | POA: Diagnosis not present

## 2022-03-26 DIAGNOSIS — M62830 Muscle spasm of back: Secondary | ICD-10-CM | POA: Diagnosis not present

## 2022-03-26 DIAGNOSIS — M5136 Other intervertebral disc degeneration, lumbar region: Secondary | ICD-10-CM | POA: Diagnosis not present

## 2022-04-02 ENCOUNTER — Ambulatory Visit (AMBULATORY_SURGERY_CENTER): Payer: Self-pay | Admitting: *Deleted

## 2022-04-02 VITALS — Ht 73.0 in | Wt 271.0 lb

## 2022-04-02 DIAGNOSIS — M9904 Segmental and somatic dysfunction of sacral region: Secondary | ICD-10-CM | POA: Diagnosis not present

## 2022-04-02 DIAGNOSIS — M9903 Segmental and somatic dysfunction of lumbar region: Secondary | ICD-10-CM | POA: Diagnosis not present

## 2022-04-02 DIAGNOSIS — Z86010 Personal history of colonic polyps: Secondary | ICD-10-CM

## 2022-04-02 DIAGNOSIS — M5136 Other intervertebral disc degeneration, lumbar region: Secondary | ICD-10-CM | POA: Diagnosis not present

## 2022-04-02 DIAGNOSIS — M62830 Muscle spasm of back: Secondary | ICD-10-CM | POA: Diagnosis not present

## 2022-04-02 MED ORDER — NA SULFATE-K SULFATE-MG SULF 17.5-3.13-1.6 GM/177ML PO SOLN: 1.0000 | Freq: Once | ORAL | 0 refills | Status: AC

## 2022-04-02 NOTE — Progress Notes (Signed)
No egg or soy allergy known to patient  No issues known to pt with past sedation with any surgeries or procedures Patient denies ever being told they had issues or difficulty with intubation  No FH of Malignant Hyperthermia Pt is not on diet pills Pt is not on home 02  Pt is not on blood thinners  Pt denies issues with constipation  No A fib or A flutter Have any cardiac testing pending--NO Pt instructed to use [email or web address] or GoodRx for a price reduction on prep   

## 2022-04-04 ENCOUNTER — Telehealth: Payer: Self-pay | Admitting: Gastroenterology

## 2022-04-04 NOTE — Telephone Encounter (Signed)
Inbound call from patient stating that he needed to reschedule his colonoscopy to another day. Patient was rescheduled for  10/26 at 8:30 and is requesting new prep instructions to be sent. Please advise.

## 2022-04-15 DIAGNOSIS — D225 Melanocytic nevi of trunk: Secondary | ICD-10-CM | POA: Diagnosis not present

## 2022-04-15 DIAGNOSIS — L814 Other melanin hyperpigmentation: Secondary | ICD-10-CM | POA: Diagnosis not present

## 2022-04-15 DIAGNOSIS — L821 Other seborrheic keratosis: Secondary | ICD-10-CM | POA: Diagnosis not present

## 2022-04-15 DIAGNOSIS — Z86007 Personal history of in-situ neoplasm of skin: Secondary | ICD-10-CM | POA: Diagnosis not present

## 2022-04-15 DIAGNOSIS — Z85828 Personal history of other malignant neoplasm of skin: Secondary | ICD-10-CM | POA: Diagnosis not present

## 2022-04-15 DIAGNOSIS — L570 Actinic keratosis: Secondary | ICD-10-CM | POA: Diagnosis not present

## 2022-04-15 DIAGNOSIS — Z08 Encounter for follow-up examination after completed treatment for malignant neoplasm: Secondary | ICD-10-CM | POA: Diagnosis not present

## 2022-04-15 DIAGNOSIS — L309 Dermatitis, unspecified: Secondary | ICD-10-CM | POA: Diagnosis not present

## 2022-04-16 DIAGNOSIS — M9904 Segmental and somatic dysfunction of sacral region: Secondary | ICD-10-CM | POA: Diagnosis not present

## 2022-04-16 DIAGNOSIS — M62830 Muscle spasm of back: Secondary | ICD-10-CM | POA: Diagnosis not present

## 2022-04-16 DIAGNOSIS — M5136 Other intervertebral disc degeneration, lumbar region: Secondary | ICD-10-CM | POA: Diagnosis not present

## 2022-04-16 DIAGNOSIS — M9903 Segmental and somatic dysfunction of lumbar region: Secondary | ICD-10-CM | POA: Diagnosis not present

## 2022-04-23 ENCOUNTER — Encounter: Payer: BC Managed Care – PPO | Admitting: Gastroenterology

## 2022-04-30 DIAGNOSIS — M62830 Muscle spasm of back: Secondary | ICD-10-CM | POA: Diagnosis not present

## 2022-04-30 DIAGNOSIS — M9903 Segmental and somatic dysfunction of lumbar region: Secondary | ICD-10-CM | POA: Diagnosis not present

## 2022-04-30 DIAGNOSIS — M9904 Segmental and somatic dysfunction of sacral region: Secondary | ICD-10-CM | POA: Diagnosis not present

## 2022-04-30 DIAGNOSIS — M5136 Other intervertebral disc degeneration, lumbar region: Secondary | ICD-10-CM | POA: Diagnosis not present

## 2022-05-07 ENCOUNTER — Encounter: Payer: Self-pay | Admitting: Gastroenterology

## 2022-05-14 DIAGNOSIS — M9904 Segmental and somatic dysfunction of sacral region: Secondary | ICD-10-CM | POA: Diagnosis not present

## 2022-05-14 DIAGNOSIS — M5136 Other intervertebral disc degeneration, lumbar region: Secondary | ICD-10-CM | POA: Diagnosis not present

## 2022-05-14 DIAGNOSIS — M62830 Muscle spasm of back: Secondary | ICD-10-CM | POA: Diagnosis not present

## 2022-05-14 DIAGNOSIS — M9903 Segmental and somatic dysfunction of lumbar region: Secondary | ICD-10-CM | POA: Diagnosis not present

## 2022-05-16 ENCOUNTER — Ambulatory Visit (AMBULATORY_SURGERY_CENTER): Payer: PPO | Admitting: Gastroenterology

## 2022-05-16 ENCOUNTER — Encounter: Payer: Self-pay | Admitting: Gastroenterology

## 2022-05-16 VITALS — BP 137/78 | HR 66 | Temp 97.3°F | Resp 16 | Ht 73.0 in | Wt 271.0 lb

## 2022-05-16 DIAGNOSIS — D123 Benign neoplasm of transverse colon: Secondary | ICD-10-CM

## 2022-05-16 DIAGNOSIS — Z86010 Personal history of colonic polyps: Secondary | ICD-10-CM

## 2022-05-16 DIAGNOSIS — Z09 Encounter for follow-up examination after completed treatment for conditions other than malignant neoplasm: Secondary | ICD-10-CM | POA: Diagnosis not present

## 2022-05-16 DIAGNOSIS — Z1211 Encounter for screening for malignant neoplasm of colon: Secondary | ICD-10-CM | POA: Diagnosis not present

## 2022-05-16 MED ORDER — SODIUM CHLORIDE 0.9 % IV SOLN: 500.0000 mL | INTRAVENOUS | Status: DC

## 2022-05-16 NOTE — Patient Instructions (Signed)
-   Patient has a contact number available for emergencies. The signs and symptoms of potential delayed complications were discussed with the patient. Return to normal activities tomorrow. Written discharge instructions were provided to the patient. - Resume previous diet. - Continue present medications. - Await pathology results. -Handout on polyps, diverticulosis and hemorrhoids provided   YOU HAD AN ENDOSCOPIC PROCEDURE TODAY AT THE [facility]:   Refer to the procedure report that was given to you for any specific questions about what was found during the examination.  If the procedure report does not answer your questions, please call your gastroenterologist to clarify.  If you requested that your care partner not be given the details of your procedure findings, then the procedure report has been included in a sealed envelope for you to review at your convenience later.  YOU SHOULD EXPECT: Some feelings of bloating in the abdomen. Passage of more gas than usual.  Walking can help get rid of the air that was put into your GI tract during the procedure and reduce the bloating. If you had a lower endoscopy (such as a colonoscopy or flexible sigmoidoscopy) you may notice spotting of blood in your stool or on the toilet paper. If you underwent a bowel prep for your procedure, you may not have a normal bowel movement for a few days.  Please Note:  You might notice some irritation and congestion in your nose or some drainage.  This is from the oxygen used during your procedure.  There is no need for concern and it should clear up in a day or so.  SYMPTOMS TO REPORT IMMEDIATELY:  Following lower endoscopy (colonoscopy or flexible sigmoidoscopy):  Excessive amounts of blood in the stool  Significant tenderness or worsening of abdominal pains  Swelling of the abdomen that is new, acute  Fever of 100F or higher    For urgent or emergent issues, a gastroenterologist can be reached at any  hour by calling [phone]. Do not use MyChart messaging for urgent concerns.    DIET:  We do recommend a small meal at first, but then you may proceed to your regular diet.  Drink plenty of fluids but you should avoid alcoholic beverages for 24 hours.  ACTIVITY:  You should plan to take it easy for the rest of today and you should NOT DRIVE or use heavy machinery until tomorrow (because of the sedation medicines used during the test).    FOLLOW UP: Our staff will call the number listed on your records the next business day following your procedure.  We will call around 7:15- 8:00 am to check on you and address any questions or concerns that you may have regarding the information given to you following your procedure. If we do not reach you, we will leave a message.     If any biopsies were taken you will be contacted by phone or by letter within the next 1-3 weeks.  Please call us at [phone] if you have not heard about the biopsies in 3 weeks.    SIGNATURES/CONFIDENTIALITY: You and/or your care partner have signed paperwork which will be entered into your electronic medical record.  These signatures attest to the fact that that the information above on your After Visit Summary has been reviewed and is understood.  Full responsibility of the confidentiality of this discharge information lies with you and/or your care-partner.  

## 2022-05-16 NOTE — Progress Notes (Signed)
Called to room to assist during endoscopic procedure.  Patient ID and intended procedure confirmed with present staff. Received instructions for my participation in the procedure from the performing physician.  

## 2022-05-16 NOTE — Op Note (Signed)
[place] Patient Name: Ryan Turner Procedure Date: 05/16/2022 8:35 AM MRN: [id number] Endoscopist: Remo Lipps P. Havery Moros , MD, [id number] Age: 71 Referring MD:  Date of Birth: 1951/05/03 Gender: Male Account #: [id number] Procedure:                Colonoscopy Indications:              High risk colon cancer surveillance: Personal                            history of colonic polyps - 5 polyps removed 01/2019                            - most adenomas / sessile serrated Medicines:                Monitored Anesthesia Care Procedure:                Pre-Anesthesia Assessment:                           - Prior to the procedure, a History and Physical                            was performed, and patient medications and                            allergies were reviewed. The patient's tolerance of                            previous anesthesia was also reviewed. The risks                            and benefits of the procedure and the sedation                            options and risks were discussed with the patient.                            All questions were answered, and informed consent                            was obtained. Prior Anticoagulants: The patient has                            taken no anticoagulant or antiplatelet agents. ASA                            Grade Assessment: II - A patient with mild systemic                            disease. After reviewing the risks and benefits,                            the patient was deemed in satisfactory condition to  undergo the procedure.                           After obtaining informed consent, the colonoscope                            was passed under direct vision. Throughout the                            procedure, the patient's blood pressure, pulse, and                            oxygen saturations were monitored continuously. The                            CF [id number]  #[id number] was introduced through the anus                            and advanced to the the cecum, identified by                            appendiceal orifice and ileocecal valve. The                            colonoscopy was performed without difficulty. The                            patient tolerated the procedure well. The quality                            of the bowel preparation was good. The ileocecal                            valve, appendiceal orifice, and rectum were                            photographed. Scope In: 8:41:47 AM Scope Out: 8:58:10 AM Scope Withdrawal Time: 0 hours 14 minutes 10 seconds  Total Procedure Duration: 0 hours 16 minutes 23 seconds  Findings:                 The perianal and digital rectal examinations were                            normal.                           A 4 mm polyp was found in the transverse colon. The                            polyp was sessile. The polyp was removed with a                            cold snare. Resection and retrieval were complete.  Many medium-mouthed diverticula were found in the                            entire colon.                           Internal hemorrhoids were found during retroflexion.                           The exam was otherwise without abnormality. Complications:            No immediate complications. Estimated blood loss:                            Minimal. Estimated Blood Loss:     Estimated blood loss was minimal. Impression:               - One 4 mm polyp in the transverse colon, removed                            with a cold snare. Resected and retrieved.                           - Diverticulosis in the entire examined colon.                           - Internal hemorrhoids.                           - The examination was otherwise normal. Recommendation:           - Patient has a contact number available for                            emergencies. The signs  and symptoms of potential                            delayed complications were discussed with the                            patient. Return to normal activities tomorrow.                            Written discharge instructions were provided to the                            patient.                           - Resume previous diet.                           - Continue present medications.                           - Await pathology results. Remo Lipps P. Kypton Eltringham, MD 05/16/2022 9:03:38 AM This report has been signed electronically.

## 2022-05-16 NOTE — Progress Notes (Signed)
To pacu, VSS. Report to [email or web address] 

## 2022-05-16 NOTE — Progress Notes (Signed)
[facility] History and Physical   Primary Care Physician:  [name], MD   Reason for Procedure:   History of colon polyps  Plan:    colonoscopy     HPI: Ryan Turner is a 71 y.o. male  here for colonoscopy surveillance - 01/2019 - 5 polyps, most TAs / SSPs.   Patient denies any bowel symptoms at this time. No family history of colon cancer known. Otherwise feels well without any cardiopulmonary symptoms.   I have discussed risks / benefits of anesthesia and endoscopic procedure with [name] and they wish to proceed with the exams as outlined today.    Past Medical History:  Diagnosis Date   Cancer [facility])    SKIN   Cataract 2014   removed   COVID-19    DDD (degenerative disc disease), lumbar    Diverticulosis    Empyema lung ([place])    PT.DENIES 04/02/22   Headache    IBS (irritable bowel syndrome)    Sleep apnea     Past Surgical History:  Procedure Laterality Date   cataracts     COLON SURGERY  1995   fistula   COLONOSCOPY     EYE SURGERY  2014   catarats/lens replaced   mohs  2015   skin cancer from [place]    Prior to Admission medications   Medication Sig Start Date End Date Taking? Authorizing Provider  Glucosamine-Chondroit-Vit C-Mn (GLUCOSAMINE CHONDROITIN COMPLX) CAPS Take 2 capsules by mouth daily. 1800 mg   Yes [provider]  Multiple Vitamins-Minerals (OCUVITE PRESERVISION PO) Take 2 tablets by mouth daily.    Yes [provider]  Omega-3 Fatty Acids (FISH OIL) 1200 MG CAPS Take 2 capsules by mouth daily.   Yes [provider]  zolpidem (AMBIEN) 10 MG tablet TAKE 1/2 TO 1 TABLET BY MOUTH AT BEDTIME AS NEEDED FOR SLEEP 02/21/22  Yes [name], MD  celecoxib (CELEBREX) 200 MG capsule TAKE 1 CAPSULE (200 MG TOTAL) BY MOUTH AS NEEDED. 10/16/21   [name], MD  EC-RX Testosterone 20 % CREA Apply 20 % topically. Patient not  taking: Reported on 04/02/2022    [provider]  hyoscyamine (LEVSIN SL) 0.125 MG SL tablet PLACE 1 TABLET UNDER THE TONGUE 4 (FOUR) TIMES DAILY AS NEEDED. Patient not taking: Reported on 04/02/2022 06/13/20   [name], MD  ketoconazole (NIZORAL) 2 % cream Apply topically 2 (two) times daily. 01/17/21   [provider]    Current Outpatient Medications  Medication Sig Dispense Refill   Glucosamine-Chondroit-Vit C-Mn (GLUCOSAMINE CHONDROITIN COMPLX) CAPS Take 2 capsules by mouth daily. 1800 mg     Multiple Vitamins-Minerals (OCUVITE PRESERVISION PO) Take 2 tablets by mouth daily.      Omega-3 Fatty Acids (FISH OIL) 1200 MG CAPS Take 2 capsules by mouth daily.     zolpidem (AMBIEN) 10 MG tablet TAKE 1/2 TO 1 TABLET BY MOUTH AT BEDTIME AS NEEDED FOR SLEEP 30 tablet 2   celecoxib (CELEBREX) 200 MG capsule TAKE 1 CAPSULE (200 MG TOTAL) BY MOUTH AS NEEDED. 30 capsule 5   EC-RX Testosterone 20 % CREA Apply 20 % topically. (Patient not taking: Reported on 04/02/2022)     hyoscyamine (LEVSIN SL) 0.125 MG SL tablet PLACE 1 TABLET UNDER THE TONGUE 4 (FOUR) TIMES DAILY AS NEEDED. (Patient not taking: Reported on 04/02/2022) 360 tablet  1   ketoconazole (NIZORAL) 2 % cream Apply topically 2 (two) times daily.     Current Facility-Administered Medications  Medication Dose Route Frequency Provider Last Rate Last Admin   0.9 %  sodium chloride infusion  500 mL Intravenous Continuous Tomas Schamp, Carlota Raspberry, MD        Allergies as of 05/16/2022 - Review Complete 05/16/2022  Allergen Reaction Noted   Morphine  01/11/2013    Family History  Problem Relation Age of Onset   Cancer Mother        lung (unknown primary)   Cancer Father        lung mets to spine   Cancer Brother        melanoma   Leukemia Maternal Grandfather    Stomach cancer Paternal Grandmother    Colon cancer Neg Hx    Colon polyps Neg Hx    Diabetes Neg Hx    Esophageal cancer Neg Hx    Rectal cancer Neg Hx     Crohn's disease Neg Hx    Ulcerative colitis Neg Hx     Social History   Socioeconomic History   Marital status: Married    Spouse name: Not on file   Number of children: Not on file   Years of education: College   Highest education level: Not on file  Occupational History   Occupation: Broker  Tobacco Use   Smoking status: Former    Types: Cigars    Quit date: 01/28/2020    Years since quitting: 2.2    Passive exposure: Past   Smokeless tobacco: Never   Tobacco comments:    quit cigarettes in 1973  Vaping Use   Vaping Use: Never used  Substance and Sexual Activity   Alcohol use: Not Currently    Alcohol/week: 6.0 standard drinks of alcohol    Types: 2 Cans of beer, 4 Shots of liquor per week   Drug use: No   Sexual activity: Yes    Birth control/protection: None  Other Topics Concern   Not on file  Social History Narrative   Lives at home with wife.   Social Determinants of Health   Financial Resource Strain: Low Risk  (01/11/2021)   Overall Financial Resource Strain (CARDIA)    Difficulty of Paying Living Expenses: Not hard at all  Food Insecurity: No Food Insecurity (01/11/2021)   Hunger Vital Sign    Worried About Running Out of Food in the Last Year: Never true    Ran Out of Food in the Last Year: Never true  Transportation Needs: No Transportation Needs (01/11/2021)   PRAPARE - Hydrologist (Medical): No    Lack of Transportation (Non-Medical): No  Physical Activity: Insufficiently Active (01/11/2021)   Exercise Vital Sign    Days of Exercise per Week: 2 days    Minutes of Exercise per Session: 40 min  Stress: No Stress Concern Present (01/11/2021)   [place]    Feeling of Stress : Not at all  Social Connections: Moderately Integrated (01/11/2021)   Social Connection and Isolation Panel [NHANES]    Frequency of Communication with Friends and Family: More than  three times a week    Frequency of Social Gatherings with Friends and Family: More than three times a week    Attends Religious Services: Never    Marine scientist or Organizations: Yes    Attends Archivist Meetings: More than  4 times per year    Marital Status: Married  Human resources officer Violence: Not At Risk (01/11/2021)   Humiliation, Afraid, Rape, and Kick questionnaire    Fear of Current or Ex-Partner: No    Emotionally Abused: No    Physically Abused: No    Sexually Abused: No    Review of Systems: All other review of systems negative except as mentioned in the HPI.  Physical Exam: Vital signs BP (!) 127/52   Pulse 77   Temp (!) 97.3 F (36.3 C)   Ht '6\' 1"'$  (1.854 m)   Wt 271 lb (122.9 kg)   SpO2 97%   BMI 35.75 kg/m   General:   Alert,  Well-developed, pleasant and cooperative in NAD Lungs:  Clear throughout to auscultation.   Heart:  Regular rate and rhythm Abdomen:  Soft, nontender and nondistended.   Neuro/Psych:  Alert and cooperative. Normal mood and affect. A and O x 3  Jolly Mango, MD [facility]

## 2022-05-17 ENCOUNTER — Telehealth: Payer: Self-pay | Admitting: *Deleted

## 2022-05-17 NOTE — Telephone Encounter (Signed)
  Follow up Call-     05/16/2022    7:46 AM  Call back number  Post procedure Call Back phone  # [phone]  Permission to leave phone message Yes     Patient questions:  Do you have a fever, pain , or abdominal swelling? No. Pain Score  0 *  Have you tolerated food without any problems? Yes.    Have you been able to return to your normal activities? Yes.    Do you have any questions about your discharge instructions: Diet   No. Medications  No. Follow up visit  No.  Do you have questions or concerns about your Care? No.  Actions: * If pain score is 4 or above: No action needed, pain <4.

## 2022-05-28 DIAGNOSIS — M9904 Segmental and somatic dysfunction of sacral region: Secondary | ICD-10-CM | POA: Diagnosis not present

## 2022-05-28 DIAGNOSIS — M5136 Other intervertebral disc degeneration, lumbar region: Secondary | ICD-10-CM | POA: Diagnosis not present

## 2022-05-28 DIAGNOSIS — M62830 Muscle spasm of back: Secondary | ICD-10-CM | POA: Diagnosis not present

## 2022-05-28 DIAGNOSIS — M9903 Segmental and somatic dysfunction of lumbar region: Secondary | ICD-10-CM | POA: Diagnosis not present

## 2022-06-12 ENCOUNTER — Telehealth: Payer: Self-pay | Admitting: Family Medicine

## 2022-06-12 NOTE — Telephone Encounter (Signed)
Left message for patient to call back and schedule Medicare Annual Wellness Visit (AWV) in office.   If not able to come in office, please offer to do virtually or by telephone.   Last AWV: 06/18/2021   Please schedule at anytime with [facility]  If any questions, please contact me at [phone].  Thank you ,  Colletta [place]

## 2022-06-18 DIAGNOSIS — M62830 Muscle spasm of back: Secondary | ICD-10-CM | POA: Diagnosis not present

## 2022-06-18 DIAGNOSIS — M5136 Other intervertebral disc degeneration, lumbar region: Secondary | ICD-10-CM | POA: Diagnosis not present

## 2022-06-18 DIAGNOSIS — M9904 Segmental and somatic dysfunction of sacral region: Secondary | ICD-10-CM | POA: Diagnosis not present

## 2022-06-18 DIAGNOSIS — M9903 Segmental and somatic dysfunction of lumbar region: Secondary | ICD-10-CM | POA: Diagnosis not present

## 2022-06-20 ENCOUNTER — Encounter: Payer: BC Managed Care – PPO | Admitting: Family Medicine

## 2022-06-25 ENCOUNTER — Ambulatory Visit (INDEPENDENT_AMBULATORY_CARE_PROVIDER_SITE_OTHER): Payer: PPO | Admitting: Family Medicine

## 2022-06-25 ENCOUNTER — Encounter: Payer: Self-pay | Admitting: Family Medicine

## 2022-06-25 VITALS — BP 122/82 | HR 81 | Ht 73.0 in | Wt 268.4 lb

## 2022-06-25 DIAGNOSIS — Z0000 Encounter for general adult medical examination without abnormal findings: Secondary | ICD-10-CM | POA: Diagnosis not present

## 2022-06-25 DIAGNOSIS — Z86718 Personal history of other venous thrombosis and embolism: Secondary | ICD-10-CM

## 2022-06-25 DIAGNOSIS — M62830 Muscle spasm of back: Secondary | ICD-10-CM | POA: Diagnosis not present

## 2022-06-25 DIAGNOSIS — M9904 Segmental and somatic dysfunction of sacral region: Secondary | ICD-10-CM | POA: Diagnosis not present

## 2022-06-25 DIAGNOSIS — M9903 Segmental and somatic dysfunction of lumbar region: Secondary | ICD-10-CM | POA: Diagnosis not present

## 2022-06-25 DIAGNOSIS — Z125 Encounter for screening for malignant neoplasm of prostate: Secondary | ICD-10-CM | POA: Diagnosis not present

## 2022-06-25 DIAGNOSIS — Z13220 Encounter for screening for lipoid disorders: Secondary | ICD-10-CM

## 2022-06-25 DIAGNOSIS — M5136 Other intervertebral disc degeneration, lumbar region: Secondary | ICD-10-CM | POA: Diagnosis not present

## 2022-06-25 MED ORDER — ZOLPIDEM TARTRATE 10 MG PO TABS: 5.0000 mg | ORAL_TABLET | Freq: Every evening | ORAL | 2 refills | Status: DC | PRN

## 2022-06-25 NOTE — Progress Notes (Signed)
Subjective:    Patient ID: Ryan Turner, male    DOB: [birth year], 71 y.o.   MRN: [id number]  HPI  Patient is a 71 year old Caucasian gentleman here today for complete physical exam.  Past medical history is significant for COVID, DVT, a pleural effusion that resulted in an empyema after COVID.  Patient had a colonoscopy in 2020.  This revealed 1 tubular adenoma.  I recommended a repeat colonoscopy in 7 years.  He request a PSA to screen for prostate cancer due to the fact a close friend has prostate cancer.  Otherwise he is doing well.  He denies any falls or memory loss or depression.  He does request a refill on his Ambien that he uses periodically for sleep. Immunization History  Administered Date(s) Administered   Fluad Quad(high Dose 65+) 04/12/2020   Influenza, High Dose Seasonal PF 04/17/2018, 05/19/2019, 05/22/2019   Influenza-Unspecified 03/22/2015, 03/01/2017   Pneumococcal Conjugate-13 01/21/2015   Pneumococcal Polysaccharide-23 07/22/2013   Tdap 10/23/2015   Zoster Recombinat (Shingrix) 05/22/2019, 09/25/2019   Zoster, Live 07/22/2013    Past Medical History:  Diagnosis Date   Cancer [facility])    SKIN   Cataract 2014   removed   COVID-19    DDD (degenerative disc disease), lumbar    Diverticulosis    Empyema lung ([place])    PT.DENIES 04/02/22   Headache    IBS (irritable bowel syndrome)    Sleep apnea    Past Surgical History:  Procedure Laterality Date   cataracts     COLON SURGERY  1995   fistula   COLONOSCOPY     EYE SURGERY  2014   catarats/lens replaced   mohs  2015   skin cancer from nose   PLEURA [place]   Current Outpatient Medications on File Prior to Visit  Medication Sig Dispense Refill   celecoxib (CELEBREX) 200 MG capsule TAKE 1 CAPSULE (200 MG TOTAL) BY MOUTH AS NEEDED. 30 capsule 5   EC-RX Testosterone 20 % CREA Apply 20 % topically. (Patient not taking: Reported on 04/02/2022)      Glucosamine-Chondroit-Vit C-Mn (GLUCOSAMINE CHONDROITIN COMPLX) CAPS Take 2 capsules by mouth daily. 1800 mg     hyoscyamine (LEVSIN SL) 0.125 MG SL tablet PLACE 1 TABLET UNDER THE TONGUE 4 (FOUR) TIMES DAILY AS NEEDED. (Patient not taking: Reported on 04/02/2022) 360 tablet 1   ketoconazole (NIZORAL) 2 % cream Apply topically 2 (two) times daily.     Multiple Vitamins-Minerals (OCUVITE PRESERVISION PO) Take 2 tablets by mouth daily.      Omega-3 Fatty Acids (FISH OIL) 1200 MG CAPS Take 2 capsules by mouth daily.     zolpidem (AMBIEN) 10 MG tablet TAKE 1/2 TO 1 TABLET BY MOUTH AT BEDTIME AS NEEDED FOR SLEEP 30 tablet 2   No current facility-administered medications on file prior to visit.   Allergies  Allergen Reactions   Morphine     Other reaction(s): VOMITING   Social History   Socioeconomic History   Marital status: Married    Spouse name: Not on file   Number of children: Not on file   Years of education: College   Highest education level: Not on file  Occupational History   Occupation: Broker  Tobacco Use   Smoking status: Former    Types: Cigars    Quit date: 01/28/2020    Years since quitting: 2.4    Passive exposure:  Past   Smokeless tobacco: Never   Tobacco comments:    quit cigarettes in 1973  Vaping Use   Vaping Use: Never used  Substance and Sexual Activity   Alcohol use: Not Currently    Alcohol/week: 6.0 standard drinks of alcohol    Types: 2 Cans of beer, 4 Shots of liquor per week   Drug use: No   Sexual activity: Yes    Birth control/protection: None  Other Topics Concern   Not on file  Social History Narrative   Lives at home with wife.   Social Determinants of Health   Financial Resource Strain: Low Risk  (01/11/2021)   Overall Financial Resource Strain (CARDIA)    Difficulty of Paying Living Expenses: Not hard at all  Food Insecurity: No Food Insecurity (01/11/2021)   Hunger Vital Sign    Worried About Running Out of Food in the Last Year: Never  true    Ran Out of Food in the Last Year: Never true  Transportation Needs: No Transportation Needs (01/11/2021)   PRAPARE - Hydrologist (Medical): No    Lack of Transportation (Non-Medical): No  Physical Activity: Insufficiently Active (01/11/2021)   Exercise Vital Sign    Days of Exercise per Week: 2 days    Minutes of Exercise per Session: 40 min  Stress: No Stress Concern Present (01/11/2021)   [place]    Feeling of Stress : Not at all  Social Connections: Moderately Integrated (01/11/2021)   Social Connection and Isolation Panel [NHANES]    Frequency of Communication with Friends and Family: More than three times a week    Frequency of Social Gatherings with Friends and Family: More than three times a week    Attends Religious Services: Never    Marine scientist or Organizations: Yes    Attends Music therapist: More than 4 times per year    Marital Status: Married  Human resources officer Violence: Not At Risk (01/11/2021)   Humiliation, Afraid, Rape, and Kick questionnaire    Fear of Current or Ex-Partner: No    Emotionally Abused: No    Physically Abused: No    Sexually Abused: No   Family History  Problem Relation Age of Onset   Cancer Mother        lung (unknown primary)   Cancer Father        lung mets to spine   Cancer Brother        melanoma   Leukemia Maternal Grandfather    Stomach cancer Paternal Grandmother    Colon cancer Neg Hx    Colon polyps Neg Hx    Diabetes Neg Hx    Esophageal cancer Neg Hx    Rectal cancer Neg Hx    Crohn's disease Neg Hx    Ulcerative colitis Neg Hx      Review of Systems  All other systems reviewed and are negative.      Objective:   Physical Exam Vitals reviewed.  Constitutional:      General: He is not in acute distress.    Appearance: He is well-developed. He is not diaphoretic.  HENT:     Head:  Normocephalic and atraumatic.     Right Ear: External ear normal.     Left Ear: External ear normal.     Nose: Nose normal.     Mouth/Throat:     Pharynx: No oropharyngeal exudate.  Eyes:  General: No scleral icterus.       Right eye: No discharge.        Left eye: No discharge.     Conjunctiva/sclera: Conjunctivae normal.     Pupils: Pupils are equal, round, and reactive to light.  Neck:     Thyroid: No thyromegaly.     Vascular: No JVD.     Trachea: No tracheal deviation.  Cardiovascular:     Rate and Rhythm: Normal rate and regular rhythm.     Heart sounds: Normal heart sounds. No murmur heard.    No friction rub. No gallop.  Pulmonary:     Effort: Pulmonary effort is normal. No respiratory distress.     Breath sounds: Normal breath sounds. No stridor. No wheezing or rales.  Chest:     Chest wall: No tenderness.  Abdominal:     General: Bowel sounds are normal. There is no distension.     Palpations: Abdomen is soft. There is no mass.     Tenderness: There is no abdominal tenderness. There is no guarding or rebound.  Musculoskeletal:        General: No tenderness. Normal range of motion.     Cervical back: Normal range of motion and neck supple.  Lymphadenopathy:     Cervical: No cervical adenopathy.  Skin:    General: Skin is warm.     Coloration: Skin is not pale.     Findings: No erythema or rash.  Neurological:     Mental Status: He is alert and oriented to person, place, and time.     Cranial Nerves: No cranial nerve deficit.     Motor: No abnormal muscle tone.     Coordination: Coordination normal.     Deep Tendon Reflexes: Reflexes are normal and symmetric.  Psychiatric:        Behavior: Behavior normal.        Thought Content: Thought content normal.        Judgment: Judgment normal.          Assessment & Plan:  Encounter for Medicare annual wellness exam  History of DVT in adulthood  Prostate cancer screening - Plan: PSA  Screening  cholesterol level - Plan: CBC with Differential/Platelet, Lipid panel, COMPLETE METABOLIC PANEL WITH GFR Patient declines a flu shot.  He declines a COVID booster.  He declines RSV.  I will check a PSA to screen for prostate cancer per his request.  His colonoscopy is up-to-date.  I will check a CBC a CMP and a lipid panel.  I refilled his Ambien.  He denies any falls or depression or memory loss

## 2022-06-26 LAB — CBC WITH DIFFERENTIAL/PLATELET
Absolute Monocytes: 381 cells/uL (ref 200–950)
Basophils Absolute: 28 cells/uL (ref 0–200)
Basophils Relative: 0.6 %
Eosinophils Absolute: 61 cells/uL (ref 15–500)
Eosinophils Relative: 1.3 %
HCT: 50 % (ref 38.5–50.0)
Hemoglobin: 17.1 g/dL (ref 13.2–17.1)
Lymphs Abs: 1622 cells/uL (ref 850–3900)
MCH: 32 pg (ref 27.0–33.0)
MCHC: 34.2 g/dL (ref 32.0–36.0)
MCV: 93.5 fL (ref 80.0–100.0)
MPV: 9.5 fL (ref 7.5–12.5)
Monocytes Relative: 8.1 %
Neutro Abs: 2609 cells/uL (ref 1500–7800)
Neutrophils Relative %: 55.5 %
Platelets: 177 10*3/uL (ref 140–400)
RBC: 5.35 10*6/uL (ref 4.20–5.80)
RDW: 12.7 % (ref 11.0–15.0)
Total Lymphocyte: 34.5 %
WBC: 4.7 10*3/uL (ref 3.8–10.8)

## 2022-06-26 LAB — COMPLETE METABOLIC PANEL WITH GFR
AG Ratio: 2 (calc) (ref 1.0–2.5)
ALT: 21 U/L (ref 9–46)
AST: 15 U/L (ref 10–35)
Albumin: 4.5 g/dL (ref 3.6–5.1)
Alkaline phosphatase (APISO): 48 U/L (ref 35–144)
BUN: 17 mg/dL (ref 7–25)
CO2: 28 mmol/L (ref 20–32)
Calcium: 9.7 mg/dL (ref 8.6–10.3)
Chloride: 104 mmol/L (ref 98–110)
Creat: 0.93 mg/dL (ref 0.70–1.28)
Globulin: 2.2 g/dL (calc) (ref 1.9–3.7)
Glucose, Bld: 96 mg/dL (ref 65–99)
Potassium: 4.6 mmol/L (ref 3.5–5.3)
Sodium: 141 mmol/L (ref 135–146)
Total Bilirubin: 0.6 mg/dL (ref 0.2–1.2)
Total Protein: 6.7 g/dL (ref 6.1–8.1)
eGFR: 87 mL/min/{1.73_m2} (ref >=60)

## 2022-06-26 LAB — PSA: PSA: 0.42 ng/mL (ref <=4.00)

## 2022-06-26 LAB — LIPID PANEL
Cholesterol: 197 mg/dL (ref <200)
HDL: 48 mg/dL (ref >=40)
LDL Cholesterol (Calc): 124 mg/dL (calc) — ABNORMAL HIGH
Non-HDL Cholesterol (Calc): 149 mg/dL (calc) — ABNORMAL HIGH (ref <130)
Total CHOL/HDL Ratio: 4.1 (calc) (ref <5.0)
Triglycerides: 134 mg/dL (ref <150)

## 2022-07-02 ENCOUNTER — Encounter: Payer: Self-pay | Admitting: Family Medicine

## 2022-07-09 DIAGNOSIS — M62830 Muscle spasm of back: Secondary | ICD-10-CM | POA: Diagnosis not present

## 2022-07-09 DIAGNOSIS — M9904 Segmental and somatic dysfunction of sacral region: Secondary | ICD-10-CM | POA: Diagnosis not present

## 2022-07-09 DIAGNOSIS — M9903 Segmental and somatic dysfunction of lumbar region: Secondary | ICD-10-CM | POA: Diagnosis not present

## 2022-07-09 DIAGNOSIS — M5136 Other intervertebral disc degeneration, lumbar region: Secondary | ICD-10-CM | POA: Diagnosis not present

## 2022-07-26 ENCOUNTER — Other Ambulatory Visit: Payer: Self-pay | Admitting: Family Medicine

## 2022-07-26 MED ORDER — ZOLPIDEM TARTRATE 10 MG PO TABS: 5.0000 mg | ORAL_TABLET | Freq: Every evening | ORAL | 2 refills | Status: DC | PRN

## 2022-08-08 DIAGNOSIS — H109 Unspecified conjunctivitis: Secondary | ICD-10-CM | POA: Diagnosis not present

## 2022-08-08 DIAGNOSIS — J019 Acute sinusitis, unspecified: Secondary | ICD-10-CM | POA: Diagnosis not present

## 2022-08-08 DIAGNOSIS — H6692 Otitis media, unspecified, left ear: Secondary | ICD-10-CM | POA: Diagnosis not present

## 2022-08-24 DIAGNOSIS — H68002 Unspecified Eustachian salpingitis, left ear: Secondary | ICD-10-CM | POA: Diagnosis not present

## 2022-08-24 DIAGNOSIS — H6692 Otitis media, unspecified, left ear: Secondary | ICD-10-CM | POA: Diagnosis not present

## 2022-10-15 DIAGNOSIS — L570 Actinic keratosis: Secondary | ICD-10-CM | POA: Diagnosis not present

## 2022-10-15 DIAGNOSIS — L578 Other skin changes due to chronic exposure to nonionizing radiation: Secondary | ICD-10-CM | POA: Diagnosis not present

## 2022-10-29 DIAGNOSIS — M5136 Other intervertebral disc degeneration, lumbar region: Secondary | ICD-10-CM | POA: Diagnosis not present

## 2022-10-29 DIAGNOSIS — M9904 Segmental and somatic dysfunction of sacral region: Secondary | ICD-10-CM | POA: Diagnosis not present

## 2022-10-29 DIAGNOSIS — M62830 Muscle spasm of back: Secondary | ICD-10-CM | POA: Diagnosis not present

## 2022-10-29 DIAGNOSIS — M9903 Segmental and somatic dysfunction of lumbar region: Secondary | ICD-10-CM | POA: Diagnosis not present

## 2022-11-04 ENCOUNTER — Telehealth: Payer: Self-pay | Admitting: Family Medicine

## 2022-11-04 NOTE — Telephone Encounter (Signed)
Patient dropped off a copy of his healthcare POA; sent to the scan center.

## 2022-11-12 DIAGNOSIS — L814 Other melanin hyperpigmentation: Secondary | ICD-10-CM | POA: Diagnosis not present

## 2022-11-12 DIAGNOSIS — D225 Melanocytic nevi of trunk: Secondary | ICD-10-CM | POA: Diagnosis not present

## 2022-11-12 DIAGNOSIS — M9904 Segmental and somatic dysfunction of sacral region: Secondary | ICD-10-CM | POA: Diagnosis not present

## 2022-11-12 DIAGNOSIS — L821 Other seborrheic keratosis: Secondary | ICD-10-CM | POA: Diagnosis not present

## 2022-11-12 DIAGNOSIS — M62830 Muscle spasm of back: Secondary | ICD-10-CM | POA: Diagnosis not present

## 2022-11-12 DIAGNOSIS — L408 Other psoriasis: Secondary | ICD-10-CM | POA: Diagnosis not present

## 2022-11-12 DIAGNOSIS — M9903 Segmental and somatic dysfunction of lumbar region: Secondary | ICD-10-CM | POA: Diagnosis not present

## 2022-11-12 DIAGNOSIS — L718 Other rosacea: Secondary | ICD-10-CM | POA: Diagnosis not present

## 2022-11-12 DIAGNOSIS — M5136 Other intervertebral disc degeneration, lumbar region: Secondary | ICD-10-CM | POA: Diagnosis not present

## 2022-11-12 DIAGNOSIS — L578 Other skin changes due to chronic exposure to nonionizing radiation: Secondary | ICD-10-CM | POA: Diagnosis not present

## 2022-11-12 DIAGNOSIS — Z09 Encounter for follow-up examination after completed treatment for conditions other than malignant neoplasm: Secondary | ICD-10-CM | POA: Diagnosis not present

## 2022-11-19 DIAGNOSIS — M9903 Segmental and somatic dysfunction of lumbar region: Secondary | ICD-10-CM | POA: Diagnosis not present

## 2022-11-19 DIAGNOSIS — M62830 Muscle spasm of back: Secondary | ICD-10-CM | POA: Diagnosis not present

## 2022-11-19 DIAGNOSIS — M5136 Other intervertebral disc degeneration, lumbar region: Secondary | ICD-10-CM | POA: Diagnosis not present

## 2022-11-19 DIAGNOSIS — M9904 Segmental and somatic dysfunction of sacral region: Secondary | ICD-10-CM | POA: Diagnosis not present

## 2022-11-23 ENCOUNTER — Other Ambulatory Visit: Payer: Self-pay | Admitting: Family Medicine

## 2022-11-26 DIAGNOSIS — M9904 Segmental and somatic dysfunction of sacral region: Secondary | ICD-10-CM | POA: Diagnosis not present

## 2022-11-26 DIAGNOSIS — M5136 Other intervertebral disc degeneration, lumbar region: Secondary | ICD-10-CM | POA: Diagnosis not present

## 2022-11-26 DIAGNOSIS — M9903 Segmental and somatic dysfunction of lumbar region: Secondary | ICD-10-CM | POA: Diagnosis not present

## 2022-11-26 DIAGNOSIS — M62830 Muscle spasm of back: Secondary | ICD-10-CM | POA: Diagnosis not present

## 2022-12-03 DIAGNOSIS — M5136 Other intervertebral disc degeneration, lumbar region: Secondary | ICD-10-CM | POA: Diagnosis not present

## 2022-12-03 DIAGNOSIS — M62830 Muscle spasm of back: Secondary | ICD-10-CM | POA: Diagnosis not present

## 2022-12-03 DIAGNOSIS — M9903 Segmental and somatic dysfunction of lumbar region: Secondary | ICD-10-CM | POA: Diagnosis not present

## 2022-12-03 DIAGNOSIS — M9904 Segmental and somatic dysfunction of sacral region: Secondary | ICD-10-CM | POA: Diagnosis not present

## 2022-12-10 DIAGNOSIS — M9903 Segmental and somatic dysfunction of lumbar region: Secondary | ICD-10-CM | POA: Diagnosis not present

## 2022-12-10 DIAGNOSIS — M62830 Muscle spasm of back: Secondary | ICD-10-CM | POA: Diagnosis not present

## 2022-12-10 DIAGNOSIS — M5136 Other intervertebral disc degeneration, lumbar region: Secondary | ICD-10-CM | POA: Diagnosis not present

## 2022-12-10 DIAGNOSIS — M9904 Segmental and somatic dysfunction of sacral region: Secondary | ICD-10-CM | POA: Diagnosis not present

## 2022-12-24 DIAGNOSIS — M5136 Other intervertebral disc degeneration, lumbar region: Secondary | ICD-10-CM | POA: Diagnosis not present

## 2022-12-24 DIAGNOSIS — M9903 Segmental and somatic dysfunction of lumbar region: Secondary | ICD-10-CM | POA: Diagnosis not present

## 2022-12-24 DIAGNOSIS — M62830 Muscle spasm of back: Secondary | ICD-10-CM | POA: Diagnosis not present

## 2022-12-24 DIAGNOSIS — M9904 Segmental and somatic dysfunction of sacral region: Secondary | ICD-10-CM | POA: Diagnosis not present

## 2022-12-26 ENCOUNTER — Other Ambulatory Visit: Payer: Self-pay | Admitting: Family Medicine

## 2022-12-26 NOTE — Telephone Encounter (Signed)
OV 06/25/22 Requested Prescriptions  Pending Prescriptions Disp Refills   celecoxib (CELEBREX) 200 MG capsule [Pharmacy Med Name: CELECOXIB 200 MG CAPSULE] 30 capsule 0    Sig: TAKE 1 CAPSULE (200 MG TOTAL) BY MOUTH AS NEEDED.     Analgesics:  COX2 Inhibitors Failed - 12/26/2022  2:44 AM      Failed - Manual Review: Labs are only required if the patient has taken medication for more than 8 weeks.      Failed - Valid encounter within last 12 months    Recent Outpatient Visits           1 year ago Encounter for Medicare annual wellness exam   [facility] Donita Brooks, MD   2 years ago Acute pain of left knee   [facility] Valentino Nose, NP   2 years ago Deep vein thrombosis (DVT) of distal vein of lower extremity, unspecified chronicity, unspecified laterality (HCC)   [facility] Donita Brooks, MD   2 years ago Pneumonia due to COVID-19 virus   [facility] Donita Brooks, MD   2 years ago Need for immunization against influenza   [facility] Pickard, Priscille Heidelberg, MD              Passed - HGB in normal range and within 360 days    Hemoglobin  Date Value Ref Range Status  06/25/2022 17.1 13.2 - 17.1 g/dL Final         Passed - Cr in normal range and within 360 days    Creat  Date Value Ref Range Status  06/25/2022 0.93 0.70 - 1.28 mg/dL Final         Passed - HCT in normal range and within 360 days    HCT  Date Value Ref Range Status  06/25/2022 50.0 38.5 - 50.0 % Final         Passed - AST in normal range and within 360 days    AST  Date Value Ref Range Status  06/25/2022 15 10 - 35 U/L Final         Passed - ALT in normal range and within 360 days    ALT  Date Value Ref Range Status  06/25/2022 21 9 - 46 U/L Final         Passed - eGFR is 30 or above and within 360 days    GFR, Est African American  Date Value Ref Range Status  03/24/2020 113 > OR = 60  mL/min/1.59m2 Final   GFR, Est Non African American  Date Value Ref Range Status  03/24/2020 98 > OR = 60 mL/min/1.27m2 Final   GFR  Date Value Ref Range Status  03/30/2020 105.75 >60.00 mL/min Final   eGFR  Date Value Ref Range Status  06/25/2022 87 > OR = 60 mL/min/1.61m2 Final         Passed - Patient is not pregnant

## 2023-01-13 DIAGNOSIS — M9904 Segmental and somatic dysfunction of sacral region: Secondary | ICD-10-CM | POA: Diagnosis not present

## 2023-01-13 DIAGNOSIS — M62830 Muscle spasm of back: Secondary | ICD-10-CM | POA: Diagnosis not present

## 2023-01-13 DIAGNOSIS — M9903 Segmental and somatic dysfunction of lumbar region: Secondary | ICD-10-CM | POA: Diagnosis not present

## 2023-01-13 DIAGNOSIS — M5136 Other intervertebral disc degeneration, lumbar region: Secondary | ICD-10-CM | POA: Diagnosis not present

## 2023-01-31 DIAGNOSIS — M9904 Segmental and somatic dysfunction of sacral region: Secondary | ICD-10-CM | POA: Diagnosis not present

## 2023-01-31 DIAGNOSIS — M9903 Segmental and somatic dysfunction of lumbar region: Secondary | ICD-10-CM | POA: Diagnosis not present

## 2023-01-31 DIAGNOSIS — M62830 Muscle spasm of back: Secondary | ICD-10-CM | POA: Diagnosis not present

## 2023-01-31 DIAGNOSIS — M5136 Other intervertebral disc degeneration, lumbar region: Secondary | ICD-10-CM | POA: Diagnosis not present

## 2023-02-04 ENCOUNTER — Other Ambulatory Visit: Payer: PPO

## 2023-02-04 DIAGNOSIS — Z13220 Encounter for screening for lipoid disorders: Secondary | ICD-10-CM | POA: Diagnosis not present

## 2023-02-04 DIAGNOSIS — Z125 Encounter for screening for malignant neoplasm of prostate: Secondary | ICD-10-CM

## 2023-02-04 DIAGNOSIS — I10 Essential (primary) hypertension: Secondary | ICD-10-CM

## 2023-02-04 DIAGNOSIS — M5136 Other intervertebral disc degeneration, lumbar region: Secondary | ICD-10-CM

## 2023-02-04 LAB — CBC WITH DIFFERENTIAL/PLATELET
RBC: 4.78 10*6/uL (ref 4.20–5.80)
Total Lymphocyte: 30.7 %

## 2023-02-05 ENCOUNTER — Encounter: Payer: Self-pay | Admitting: Family Medicine

## 2023-02-05 LAB — CBC WITH DIFFERENTIAL/PLATELET
Absolute Monocytes: 460 cells/uL (ref 200–950)
Basophils Absolute: 50 cells/uL (ref 0–200)
Basophils Relative: 1 %
Eosinophils Absolute: 120 cells/uL (ref 15–500)
Eosinophils Relative: 2.4 %
HCT: 45.7 % (ref 38.5–50.0)
Hemoglobin: 15.4 g/dL (ref 13.2–17.1)
Lymphs Abs: 1535 cells/uL (ref 850–3900)
MCH: 32.2 pg (ref 27.0–33.0)
MCHC: 33.7 g/dL (ref 32.0–36.0)
MCV: 95.6 fL (ref 80.0–100.0)
MPV: 9.7 fL (ref 7.5–12.5)
Monocytes Relative: 9.2 %
Neutro Abs: 2835 cells/uL (ref 1500–7800)
Neutrophils Relative %: 56.7 %
Platelets: 158 10*3/uL (ref 140–400)
RDW: 13.1 % (ref 11.0–15.0)
WBC: 5 10*3/uL (ref 3.8–10.8)

## 2023-02-05 LAB — LIPID PANEL
Cholesterol: 174 mg/dL (ref <200)
HDL: 46 mg/dL (ref >=40)
LDL Cholesterol (Calc): 107 mg/dL (calc) — ABNORMAL HIGH
Non-HDL Cholesterol (Calc): 128 mg/dL (calc) (ref <130)
Total CHOL/HDL Ratio: 3.8 (calc) (ref <5.0)
Triglycerides: 118 mg/dL (ref <150)

## 2023-02-05 LAB — COMPLETE METABOLIC PANEL WITH GFR
AG Ratio: 2.2 (calc) (ref 1.0–2.5)
ALT: 23 U/L (ref 9–46)
AST: 22 U/L (ref 10–35)
Albumin: 4.3 g/dL (ref 3.6–5.1)
Alkaline phosphatase (APISO): 56 U/L (ref 35–144)
BUN: 18 mg/dL (ref 7–25)
CO2: 29 mmol/L (ref 20–32)
Calcium: 9 mg/dL (ref 8.6–10.3)
Chloride: 106 mmol/L (ref 98–110)
Creat: 0.96 mg/dL (ref 0.70–1.28)
Globulin: 2 g/dL (calc) (ref 1.9–3.7)
Glucose, Bld: 102 mg/dL — ABNORMAL HIGH (ref 65–99)
Potassium: 4.9 mmol/L (ref 3.5–5.3)
Sodium: 142 mmol/L (ref 135–146)
Total Bilirubin: 0.7 mg/dL (ref 0.2–1.2)
Total Protein: 6.3 g/dL (ref 6.1–8.1)
eGFR: 83 mL/min/{1.73_m2} (ref >=60)

## 2023-02-05 LAB — PSA: PSA: 0.54 ng/mL (ref <=4.00)

## 2023-02-11 DIAGNOSIS — M5136 Other intervertebral disc degeneration, lumbar region: Secondary | ICD-10-CM | POA: Diagnosis not present

## 2023-02-11 DIAGNOSIS — M62830 Muscle spasm of back: Secondary | ICD-10-CM | POA: Diagnosis not present

## 2023-02-11 DIAGNOSIS — M9903 Segmental and somatic dysfunction of lumbar region: Secondary | ICD-10-CM | POA: Diagnosis not present

## 2023-02-11 DIAGNOSIS — M9904 Segmental and somatic dysfunction of sacral region: Secondary | ICD-10-CM | POA: Diagnosis not present

## 2023-02-25 DIAGNOSIS — M62830 Muscle spasm of back: Secondary | ICD-10-CM | POA: Diagnosis not present

## 2023-02-25 DIAGNOSIS — M9903 Segmental and somatic dysfunction of lumbar region: Secondary | ICD-10-CM | POA: Diagnosis not present

## 2023-02-25 DIAGNOSIS — M9904 Segmental and somatic dysfunction of sacral region: Secondary | ICD-10-CM | POA: Diagnosis not present

## 2023-02-25 DIAGNOSIS — M5136 Other intervertebral disc degeneration, lumbar region: Secondary | ICD-10-CM | POA: Diagnosis not present

## 2023-03-06 ENCOUNTER — Telehealth: Payer: Self-pay | Admitting: Family Medicine

## 2023-03-06 ENCOUNTER — Telehealth: Payer: Self-pay

## 2023-03-06 ENCOUNTER — Other Ambulatory Visit: Payer: Self-pay | Admitting: Family Medicine

## 2023-03-06 MED ORDER — SILDENAFIL CITRATE 100 MG PO TABS: 50.0000 mg | ORAL_TABLET | Freq: Every day | ORAL | 11 refills | Status: AC | PRN

## 2023-03-06 MED ORDER — ZOLPIDEM TARTRATE 10 MG PO TABS: 5.0000 mg | ORAL_TABLET | Freq: Every evening | ORAL | 2 refills | Status: DC | PRN

## 2023-03-06 NOTE — Telephone Encounter (Signed)
Prescription Request  03/06/2023  LOV: 06/25/2022  What is the name of the medication or equipment?   zolpidem (AMBIEN) 10 MG tablet  **NEW SCRIPT REQUESTED**  Have you contacted your pharmacy to request a refill? Yes   Which pharmacy would you like this sent to?  [id number] - [place], [place] - [address] [place] [address] Phone: [phone] Fax: [phone]    Patient notified that their request is being sent to the clinical staff for review and that they should receive a response within 2 business days.   Please advise pharmacist.

## 2023-03-06 NOTE — Telephone Encounter (Signed)
Pt called requesting a refill on his Sildenafil. Last RF was 07/14/2019. Last OV was 06/25/2022. Thanks.

## 2023-03-11 DIAGNOSIS — M9904 Segmental and somatic dysfunction of sacral region: Secondary | ICD-10-CM | POA: Diagnosis not present

## 2023-03-11 DIAGNOSIS — M9903 Segmental and somatic dysfunction of lumbar region: Secondary | ICD-10-CM | POA: Diagnosis not present

## 2023-03-11 DIAGNOSIS — M62830 Muscle spasm of back: Secondary | ICD-10-CM | POA: Diagnosis not present

## 2023-03-11 DIAGNOSIS — M5136 Other intervertebral disc degeneration, lumbar region: Secondary | ICD-10-CM | POA: Diagnosis not present

## 2023-03-25 DIAGNOSIS — M9903 Segmental and somatic dysfunction of lumbar region: Secondary | ICD-10-CM | POA: Diagnosis not present

## 2023-03-25 DIAGNOSIS — M5136 Other intervertebral disc degeneration, lumbar region: Secondary | ICD-10-CM | POA: Diagnosis not present

## 2023-03-25 DIAGNOSIS — M62830 Muscle spasm of back: Secondary | ICD-10-CM | POA: Diagnosis not present

## 2023-03-25 DIAGNOSIS — M9904 Segmental and somatic dysfunction of sacral region: Secondary | ICD-10-CM | POA: Diagnosis not present

## 2023-04-10 DIAGNOSIS — M5136 Other intervertebral disc degeneration, lumbar region: Secondary | ICD-10-CM | POA: Diagnosis not present

## 2023-04-10 DIAGNOSIS — M9904 Segmental and somatic dysfunction of sacral region: Secondary | ICD-10-CM | POA: Diagnosis not present

## 2023-04-10 DIAGNOSIS — M9903 Segmental and somatic dysfunction of lumbar region: Secondary | ICD-10-CM | POA: Diagnosis not present

## 2023-04-10 DIAGNOSIS — M62830 Muscle spasm of back: Secondary | ICD-10-CM | POA: Diagnosis not present

## 2023-04-22 DIAGNOSIS — M9903 Segmental and somatic dysfunction of lumbar region: Secondary | ICD-10-CM | POA: Diagnosis not present

## 2023-04-22 DIAGNOSIS — M62830 Muscle spasm of back: Secondary | ICD-10-CM | POA: Diagnosis not present

## 2023-04-22 DIAGNOSIS — M9904 Segmental and somatic dysfunction of sacral region: Secondary | ICD-10-CM | POA: Diagnosis not present

## 2023-04-22 DIAGNOSIS — M545 Low back pain, unspecified: Secondary | ICD-10-CM | POA: Diagnosis not present

## 2023-04-22 DIAGNOSIS — M9905 Segmental and somatic dysfunction of pelvic region: Secondary | ICD-10-CM | POA: Diagnosis not present

## 2023-04-22 DIAGNOSIS — M5417 Radiculopathy, lumbosacral region: Secondary | ICD-10-CM | POA: Diagnosis not present

## 2023-04-29 ENCOUNTER — Other Ambulatory Visit: Payer: Self-pay | Admitting: Family Medicine

## 2023-04-29 NOTE — Telephone Encounter (Signed)
Prescription Request  04/29/2023  LOV: 06/25/2022  What is the name of the medication or equipment? celecoxib (CELEBREX) 200 MG capsule   Have you contacted your pharmacy to request a refill? Yes   Which pharmacy would you like this sent to?  [id number] - [place], [place] - [address] [place] [address] Phone: [phone] Fax: [phone]    Patient notified that their request is being sent to the clinical staff for review and that they should receive a response within 2 business days.   Please advise at [facility] [phone]

## 2023-04-29 NOTE — Telephone Encounter (Signed)
Requested medication (s) are due for refill today: yes  Requested medication (s) are on the active medication list: yes  Last refill:  12/26/22  Future visit scheduled: no  Notes to clinic:  Unable to refill per protocol, courtesy refill already given, routing for provider approval.      Requested Prescriptions  Pending Prescriptions Disp Refills   celecoxib (CELEBREX) 200 MG capsule 30 capsule 0    Sig: Take 1 capsule (200 mg total) by mouth as needed.     Analgesics:  COX2 Inhibitors Failed - 04/29/2023  9:22 AM      Failed - Manual Review: Labs are only required if the patient has taken medication for more than 8 weeks.      Failed - Valid encounter within last 12 months    Recent Outpatient Visits           1 year ago Encounter for Medicare annual wellness exam   [facility] Donita Brooks, MD   2 years ago Acute pain of left knee   [facility] Valentino Nose, NP   2 years ago Deep vein thrombosis (DVT) of distal vein of lower extremity, unspecified chronicity, unspecified laterality (HCC)   [facility] Donita Brooks, MD   2 years ago Pneumonia due to COVID-19 virus   [facility] Donita Brooks, MD   3 years ago Need for immunization against influenza   [facility] Pickard, Priscille Heidelberg, MD              Passed - HGB in normal range and within 360 days    Hemoglobin  Date Value Ref Range Status  02/04/2023 15.4 13.2 - 17.1 g/dL Final         Passed - Cr in normal range and within 360 days    Creat  Date Value Ref Range Status  02/04/2023 0.96 0.70 - 1.28 mg/dL Final         Passed - HCT in normal range and within 360 days    HCT  Date Value Ref Range Status  02/04/2023 45.7 38.5 - 50.0 % Final         Passed - AST in normal range and within 360 days    AST  Date Value Ref Range Status  02/04/2023 22 10 - 35 U/L Final         Passed - ALT in normal range and  within 360 days    ALT  Date Value Ref Range Status  02/04/2023 23 9 - 46 U/L Final         Passed - eGFR is 30 or above and within 360 days    GFR, Est African American  Date Value Ref Range Status  03/24/2020 113 > OR = 60 mL/min/1.51m2 Final   GFR, Est Non African American  Date Value Ref Range Status  03/24/2020 98 > OR = 60 mL/min/1.53m2 Final   GFR  Date Value Ref Range Status  03/30/2020 105.75 >60.00 mL/min Final   eGFR  Date Value Ref Range Status  02/04/2023 83 > OR = 60 mL/min/1.82m2 Final         Passed - Patient is not pregnant

## 2023-05-04 DIAGNOSIS — F17290 Nicotine dependence, other tobacco product, uncomplicated: Secondary | ICD-10-CM | POA: Diagnosis not present

## 2023-05-04 DIAGNOSIS — R0781 Pleurodynia: Secondary | ICD-10-CM | POA: Diagnosis not present

## 2023-05-04 DIAGNOSIS — M545 Low back pain, unspecified: Secondary | ICD-10-CM | POA: Diagnosis not present

## 2023-05-04 DIAGNOSIS — J439 Emphysema, unspecified: Secondary | ICD-10-CM | POA: Diagnosis not present

## 2023-05-04 DIAGNOSIS — S3992XA Unspecified injury of lower back, initial encounter: Secondary | ICD-10-CM | POA: Diagnosis not present

## 2023-05-04 DIAGNOSIS — I10 Essential (primary) hypertension: Secondary | ICD-10-CM | POA: Diagnosis not present

## 2023-05-04 DIAGNOSIS — S299XXA Unspecified injury of thorax, initial encounter: Secondary | ICD-10-CM | POA: Diagnosis not present

## 2023-05-04 DIAGNOSIS — M546 Pain in thoracic spine: Secondary | ICD-10-CM | POA: Diagnosis not present

## 2023-05-05 DIAGNOSIS — R0781 Pleurodynia: Secondary | ICD-10-CM | POA: Diagnosis not present

## 2023-05-05 DIAGNOSIS — S299XXA Unspecified injury of thorax, initial encounter: Secondary | ICD-10-CM | POA: Diagnosis not present

## 2023-05-05 DIAGNOSIS — S3992XA Unspecified injury of lower back, initial encounter: Secondary | ICD-10-CM | POA: Diagnosis not present

## 2023-05-05 DIAGNOSIS — M545 Low back pain, unspecified: Secondary | ICD-10-CM | POA: Diagnosis not present

## 2023-05-06 DIAGNOSIS — M62830 Muscle spasm of back: Secondary | ICD-10-CM | POA: Diagnosis not present

## 2023-05-06 DIAGNOSIS — M9905 Segmental and somatic dysfunction of pelvic region: Secondary | ICD-10-CM | POA: Diagnosis not present

## 2023-05-06 DIAGNOSIS — M9903 Segmental and somatic dysfunction of lumbar region: Secondary | ICD-10-CM | POA: Diagnosis not present

## 2023-05-06 DIAGNOSIS — M9904 Segmental and somatic dysfunction of sacral region: Secondary | ICD-10-CM | POA: Diagnosis not present

## 2023-05-08 ENCOUNTER — Ambulatory Visit (INDEPENDENT_AMBULATORY_CARE_PROVIDER_SITE_OTHER): Payer: PPO | Admitting: Family Medicine

## 2023-05-08 VITALS — BP 126/72 | HR 80 | Temp 98.6°F | Ht 73.0 in | Wt 278.0 lb

## 2023-05-08 DIAGNOSIS — R5383 Other fatigue: Secondary | ICD-10-CM | POA: Diagnosis not present

## 2023-05-08 DIAGNOSIS — M62830 Muscle spasm of back: Secondary | ICD-10-CM | POA: Diagnosis not present

## 2023-05-08 DIAGNOSIS — M9905 Segmental and somatic dysfunction of pelvic region: Secondary | ICD-10-CM | POA: Diagnosis not present

## 2023-05-08 DIAGNOSIS — M9903 Segmental and somatic dysfunction of lumbar region: Secondary | ICD-10-CM | POA: Diagnosis not present

## 2023-05-08 DIAGNOSIS — M9904 Segmental and somatic dysfunction of sacral region: Secondary | ICD-10-CM | POA: Diagnosis not present

## 2023-05-08 MED ORDER — CELECOXIB 200 MG PO CAPS: 200.0000 mg | ORAL_CAPSULE | ORAL | 0 refills | Status: AC | PRN

## 2023-05-08 NOTE — Progress Notes (Signed)
Subjective:    Patient ID: Ryan Turner, male    DOB: January 07, 1951, 72 y.o.   MRN: [id number]  Back Pain   Patient was recently sitting in chair when the right leg broke.  This caused him to fall to the ground injuring his right lower back.  He had to go to the emergency room where they took x-rays of his right ribs and his right lumbar spine.  He was having severe pain in his right flank.  X-rays were negative.  He was given cyclobenzaprine.  He has not had a bowel movement in 5 days.  He denies any nausea or vomiting or abdominal pain.  He denies any hematochezia or melena.  His abdomen is soft nontender and nondistended today.  He has normal bowel sounds.  He does complain of severe fatigue for the last several months.  He does complain of poor libido and erectile dysfunction Past Medical History:  Diagnosis Date   Cancer [facility])    SKIN   Cataract 2014   removed   COVID-19    DDD (degenerative disc disease), lumbar    Diverticulosis    Empyema lung (HCC)    PT.DENIES 04/02/22   Headache    IBS (irritable bowel syndrome)    Sleep apnea    Past Surgical History:  Procedure Laterality Date   cataracts     COLON SURGERY  1995   fistula   COLONOSCOPY     EYE SURGERY  2014   catarats/lens replaced   mohs  2015   skin cancer from nose   PLEURA SAC     DRAINAGE   POLYPECTOMY     VASECTOMY  1985   Current Outpatient Medications on File Prior to Visit  Medication Sig Dispense Refill   celecoxib (CELEBREX) 200 MG capsule TAKE 1 CAPSULE (200 MG TOTAL) BY MOUTH AS NEEDED. 30 capsule 0   Glucosamine-Chondroit-Vit C-Mn (GLUCOSAMINE CHONDROITIN COMPLX) CAPS Take 2 capsules by mouth daily. 1800 mg     hyoscyamine (LEVSIN SL) 0.125 MG SL tablet PLACE 1 TABLET UNDER THE TONGUE 4 (FOUR) TIMES DAILY AS NEEDED. 360 tablet 1   Multiple Vitamins-Minerals (OCUVITE PRESERVISION PO) Take 2 tablets by mouth daily.      Omega-3 Fatty Acids (FISH OIL) 1200 MG CAPS Take 2 capsules by mouth daily.      sildenafil (VIAGRA) 100 MG tablet Take 0.5-1 tablets (50-100 mg total) by mouth daily as needed for erectile dysfunction. 5 tablet 11   zolpidem (AMBIEN) 10 MG tablet Take 0.5-1 tablets (5-10 mg total) by mouth at bedtime as needed. for sleep 30 tablet 2   No current facility-administered medications on file prior to visit.   Allergies  Allergen Reactions   Morphine     Other reaction(s): VOMITING   Social History   Socioeconomic History   Marital status: Married    Spouse name: Not on file   Number of children: Not on file   Years of education: College   Highest education level: Not on file  Occupational History   Occupation: Broker  Tobacco Use   Smoking status: Former    Types: Cigars    Quit date: 01/28/2020    Years since quitting: 3.2    Passive exposure: Past   Smokeless tobacco: Never   Tobacco comments:    quit cigarettes in 1973  Vaping Use   Vaping status: Never Used  Substance and Sexual Activity   Alcohol use: Not Currently    Alcohol/week: 6.0 standard drinks  of alcohol    Types: 2 Cans of beer, 4 Shots of liquor per week   Drug use: No   Sexual activity: Yes    Birth control/protection: None  Other Topics Concern   Not on file  Social History Narrative   Lives at home with wife.   Social Determinants of Health   Financial Resource Strain: Low Risk  (01/11/2021)   Overall Financial Resource Strain (CARDIA)    Difficulty of Paying Living Expenses: Not hard at all  Food Insecurity: No Food Insecurity (01/11/2021)   Hunger Vital Sign    Worried About Running Out of Food in the Last Year: Never true    Ran Out of Food in the Last Year: Never true  Transportation Needs: No Transportation Needs (01/11/2021)   PRAPARE - Administrator, Civil Service (Medical): No    Lack of Transportation (Non-Medical): No  Physical Activity: Insufficiently Active (01/11/2021)   Exercise Vital Sign    Days of Exercise per Week: 2 days    Minutes of Exercise  per Session: 40 min  Stress: No Stress Concern Present (01/11/2021)   [employer] of Occupational Health - Occupational Stress Questionnaire    Feeling of Stress : Not at all  Social Connections: Moderately Integrated (01/11/2021)   Social Connection and Isolation Panel [NHANES]    Frequency of Communication with Friends and Family: More than three times a week    Frequency of Social Gatherings with Friends and Family: More than three times a week    Attends Religious Services: Never    Database administrator or Organizations: Yes    Attends Engineer, structural: More than 4 times per year    Marital Status: Married  Catering manager Violence: Not At Risk (01/11/2021)   Humiliation, Afraid, Rape, and Kick questionnaire    Fear of Current or Ex-Partner: No    Emotionally Abused: No    Physically Abused: No    Sexually Abused: No      Review of Systems  Musculoskeletal:  Positive for back pain.  All other systems reviewed and are negative.      Objective:   Physical Exam Vitals reviewed.  Constitutional:      General: He is not in acute distress.    Appearance: Normal appearance. He is normal weight. He is not ill-appearing or toxic-appearing.  HENT:     Right Ear: Tympanic membrane and ear canal normal.     Left Ear: Tympanic membrane and ear canal normal.     Nose: Nose normal. No congestion or rhinorrhea.     Mouth/Throat:     Pharynx: No oropharyngeal exudate or posterior oropharyngeal erythema.  Eyes:     General:        Right eye: No discharge.        Left eye: No discharge.     Conjunctiva/sclera: Conjunctivae normal.  Cardiovascular:     Rate and Rhythm: Normal rate and regular rhythm.     Pulses: Normal pulses.     Heart sounds: Normal heart sounds. No murmur heard.    No friction rub.  Pulmonary:     Effort: Pulmonary effort is normal. No respiratory distress.     Breath sounds: Normal breath sounds. No wheezing, rhonchi or rales.  Abdominal:      General: There is no distension.     Palpations: Abdomen is soft.     Tenderness: There is no abdominal tenderness. There is no guarding.  Musculoskeletal:  Cervical back: Neck supple.  Lymphadenopathy:     Cervical: No cervical adenopathy.  Skin:    Findings: No rash.  Neurological:     General: No focal deficit present.     Mental Status: He is alert and oriented to person, place, and time.     Cranial Nerves: No cranial nerve deficit.           Assessment & Plan:  Fatigue, unspecified type - Plan: Vitamin B12, TSH, Testosterone Total,Free,Bio, Males, CBC with Differential/Platelet, COMPLETE METABOLIC PANEL WITH GFR  For the constipation, I have recommended MiraLAX 2-3 times a day coupled with Senokot.  For the fatigue I will check a B12, TSH, testosterone level, CBC, and a CMP.  The remainder of his review of systems is negative

## 2023-05-09 ENCOUNTER — Other Ambulatory Visit: Payer: Self-pay

## 2023-05-09 ENCOUNTER — Other Ambulatory Visit: Payer: PPO

## 2023-05-09 DIAGNOSIS — R5383 Other fatigue: Secondary | ICD-10-CM

## 2023-05-09 LAB — COMPLETE METABOLIC PANEL WITH GFR
AG Ratio: 1.7 (calc) (ref 1.0–2.5)
ALT: 65 U/L — ABNORMAL HIGH (ref 9–46)
AST: 42 U/L — ABNORMAL HIGH (ref 10–35)
Albumin: 3.9 g/dL (ref 3.6–5.1)
Alkaline phosphatase (APISO): 100 U/L (ref 35–144)
BUN/Creatinine Ratio: 16 (calc) (ref 6–22)
BUN: 27 mg/dL — ABNORMAL HIGH (ref 7–25)
CO2: 27 mmol/L (ref 20–32)
Calcium: 8.6 mg/dL (ref 8.6–10.3)
Chloride: 99 mmol/L (ref 98–110)
Creat: 1.74 mg/dL — ABNORMAL HIGH (ref 0.70–1.28)
Globulin: 2.3 g/dL (ref 1.9–3.7)
Glucose, Bld: 86 mg/dL (ref 65–99)
Potassium: 4.3 mmol/L (ref 3.5–5.3)
Sodium: 138 mmol/L (ref 135–146)
Total Bilirubin: 1.1 mg/dL (ref 0.2–1.2)
Total Protein: 6.2 g/dL (ref 6.1–8.1)
eGFR: 41 mL/min/{1.73_m2} — ABNORMAL LOW (ref >=60)

## 2023-05-09 LAB — CBC WITH DIFFERENTIAL/PLATELET
Absolute Lymphocytes: 1089 {cells}/uL (ref 850–3900)
Absolute Monocytes: 568 {cells}/uL (ref 200–950)
Basophils Absolute: 33 {cells}/uL (ref 0–200)
Basophils Relative: 0.5 %
Eosinophils Absolute: 92 {cells}/uL (ref 15–500)
Eosinophils Relative: 1.4 %
HCT: 44.1 % (ref 38.5–50.0)
Hemoglobin: 15.1 g/dL (ref 13.2–17.1)
MCH: 32.7 pg (ref 27.0–33.0)
MCHC: 34.2 g/dL (ref 32.0–36.0)
MCV: 95.5 fL (ref 80.0–100.0)
MPV: 9.8 fL (ref 7.5–12.5)
Monocytes Relative: 8.6 %
Neutro Abs: 4818 {cells}/uL (ref 1500–7800)
Neutrophils Relative %: 73 %
Platelets: 156 10*3/uL (ref 140–400)
RBC: 4.62 10*6/uL (ref 4.20–5.80)
RDW: 12.2 % (ref 11.0–15.0)
Total Lymphocyte: 16.5 %
WBC: 6.6 10*3/uL (ref 3.8–10.8)

## 2023-05-09 LAB — TESTOSTERONE TOTAL,FREE,BIO, MALES
Albumin: 3.9 g/dL (ref 3.6–5.1)
Sex Hormone Binding: 27 nmol/L (ref 22–77)
Testosterone: 230 ng/dL — ABNORMAL LOW (ref 250–827)

## 2023-05-09 LAB — TSH: TSH: 3.54 m[IU]/L (ref 0.40–4.50)

## 2023-05-09 LAB — URINALYSIS, ROUTINE W REFLEX MICROSCOPIC
Bilirubin Urine: NEGATIVE
Glucose, UA: NEGATIVE
Hgb urine dipstick: NEGATIVE
Ketones, ur: NEGATIVE
Leukocytes,Ua: NEGATIVE
Nitrite: NEGATIVE
Protein, ur: NEGATIVE
Specific Gravity, Urine: 1.015 (ref 1.001–1.035)
pH: 5.5 (ref 5.0–8.0)

## 2023-05-09 LAB — VITAMIN B12: Vitamin B-12: 326 pg/mL (ref 200–1100)

## 2023-05-12 ENCOUNTER — Encounter: Payer: Self-pay | Admitting: Family Medicine

## 2023-05-12 ENCOUNTER — Ambulatory Visit
Admission: RE | Admit: 2023-05-12 | Discharge: 2023-05-12 | Disposition: A | Discharge status: Home / Self Care | Payer: PPO | Source: Ambulatory Visit | Attending: Family Medicine | Admitting: Family Medicine

## 2023-05-12 ENCOUNTER — Inpatient Hospital Stay
Admission: RE | Admit: 2023-05-12 | Discharge: 2023-05-12 | Discharge status: Home / Self Care | Payer: PPO | Source: Ambulatory Visit | Attending: Family Medicine | Admitting: Family Medicine

## 2023-05-12 DIAGNOSIS — M9903 Segmental and somatic dysfunction of lumbar region: Secondary | ICD-10-CM | POA: Diagnosis not present

## 2023-05-12 DIAGNOSIS — M62830 Muscle spasm of back: Secondary | ICD-10-CM | POA: Diagnosis not present

## 2023-05-12 DIAGNOSIS — R5383 Other fatigue: Secondary | ICD-10-CM

## 2023-05-12 DIAGNOSIS — M9905 Segmental and somatic dysfunction of pelvic region: Secondary | ICD-10-CM | POA: Diagnosis not present

## 2023-05-12 DIAGNOSIS — K769 Liver disease, unspecified: Secondary | ICD-10-CM | POA: Diagnosis not present

## 2023-05-12 DIAGNOSIS — M9904 Segmental and somatic dysfunction of sacral region: Secondary | ICD-10-CM | POA: Diagnosis not present

## 2023-05-12 DIAGNOSIS — N289 Disorder of kidney and ureter, unspecified: Secondary | ICD-10-CM | POA: Diagnosis not present

## 2023-05-14 DIAGNOSIS — M9905 Segmental and somatic dysfunction of pelvic region: Secondary | ICD-10-CM | POA: Diagnosis not present

## 2023-05-14 DIAGNOSIS — M62830 Muscle spasm of back: Secondary | ICD-10-CM | POA: Diagnosis not present

## 2023-05-14 DIAGNOSIS — M9903 Segmental and somatic dysfunction of lumbar region: Secondary | ICD-10-CM | POA: Diagnosis not present

## 2023-05-14 DIAGNOSIS — M9904 Segmental and somatic dysfunction of sacral region: Secondary | ICD-10-CM | POA: Diagnosis not present

## 2023-05-15 ENCOUNTER — Other Ambulatory Visit: Payer: PPO

## 2023-05-15 DIAGNOSIS — L570 Actinic keratosis: Secondary | ICD-10-CM | POA: Diagnosis not present

## 2023-05-15 DIAGNOSIS — L821 Other seborrheic keratosis: Secondary | ICD-10-CM | POA: Diagnosis not present

## 2023-05-15 DIAGNOSIS — R5383 Other fatigue: Secondary | ICD-10-CM | POA: Diagnosis not present

## 2023-05-15 DIAGNOSIS — I10 Essential (primary) hypertension: Secondary | ICD-10-CM | POA: Diagnosis not present

## 2023-05-15 DIAGNOSIS — L820 Inflamed seborrheic keratosis: Secondary | ICD-10-CM | POA: Diagnosis not present

## 2023-05-15 DIAGNOSIS — Z13220 Encounter for screening for lipoid disorders: Secondary | ICD-10-CM | POA: Diagnosis not present

## 2023-05-15 DIAGNOSIS — L814 Other melanin hyperpigmentation: Secondary | ICD-10-CM | POA: Diagnosis not present

## 2023-05-15 DIAGNOSIS — Z125 Encounter for screening for malignant neoplasm of prostate: Secondary | ICD-10-CM | POA: Diagnosis not present

## 2023-05-15 DIAGNOSIS — D225 Melanocytic nevi of trunk: Secondary | ICD-10-CM | POA: Diagnosis not present

## 2023-05-15 DIAGNOSIS — L538 Other specified erythematous conditions: Secondary | ICD-10-CM | POA: Diagnosis not present

## 2023-05-16 LAB — COMPLETE METABOLIC PANEL WITH GFR
AG Ratio: 1.4 (calc) (ref 1.0–2.5)
ALT: 43 U/L (ref 9–46)
AST: 24 U/L (ref 10–35)
Albumin: 3.8 g/dL (ref 3.6–5.1)
Alkaline phosphatase (APISO): 115 U/L (ref 35–144)
BUN: 19 mg/dL (ref 7–25)
CO2: 27 mmol/L (ref 20–32)
Calcium: 9.1 mg/dL (ref 8.6–10.3)
Chloride: 102 mmol/L (ref 98–110)
Creat: 1.02 mg/dL (ref 0.70–1.28)
Globulin: 2.8 g/dL (ref 1.9–3.7)
Glucose, Bld: 98 mg/dL (ref 65–99)
Potassium: 4.8 mmol/L (ref 3.5–5.3)
Sodium: 140 mmol/L (ref 135–146)
Total Bilirubin: 0.5 mg/dL (ref 0.2–1.2)
Total Protein: 6.6 g/dL (ref 6.1–8.1)
eGFR: 77 mL/min/{1.73_m2} (ref >=60)

## 2023-05-16 LAB — PSA: PSA: 0.47 ng/mL (ref <=4.00)

## 2023-05-16 LAB — LIPID PANEL
Cholesterol: 179 mg/dL (ref <200)
HDL: 30 mg/dL — ABNORMAL LOW (ref >=40)
LDL Cholesterol (Calc): 125 mg/dL — ABNORMAL HIGH
Non-HDL Cholesterol (Calc): 149 mg/dL — ABNORMAL HIGH (ref <130)
Total CHOL/HDL Ratio: 6 (calc) — ABNORMAL HIGH (ref <5.0)
Triglycerides: 126 mg/dL (ref <150)

## 2023-05-28 DIAGNOSIS — M9904 Segmental and somatic dysfunction of sacral region: Secondary | ICD-10-CM | POA: Diagnosis not present

## 2023-05-28 DIAGNOSIS — M62830 Muscle spasm of back: Secondary | ICD-10-CM | POA: Diagnosis not present

## 2023-05-28 DIAGNOSIS — M9905 Segmental and somatic dysfunction of pelvic region: Secondary | ICD-10-CM | POA: Diagnosis not present

## 2023-05-28 DIAGNOSIS — M9903 Segmental and somatic dysfunction of lumbar region: Secondary | ICD-10-CM | POA: Diagnosis not present

## 2023-06-03 DIAGNOSIS — M9905 Segmental and somatic dysfunction of pelvic region: Secondary | ICD-10-CM | POA: Diagnosis not present

## 2023-06-03 DIAGNOSIS — M9904 Segmental and somatic dysfunction of sacral region: Secondary | ICD-10-CM | POA: Diagnosis not present

## 2023-06-03 DIAGNOSIS — M62830 Muscle spasm of back: Secondary | ICD-10-CM | POA: Diagnosis not present

## 2023-06-03 DIAGNOSIS — M9903 Segmental and somatic dysfunction of lumbar region: Secondary | ICD-10-CM | POA: Diagnosis not present

## 2023-06-17 DIAGNOSIS — M9903 Segmental and somatic dysfunction of lumbar region: Secondary | ICD-10-CM | POA: Diagnosis not present

## 2023-06-17 DIAGNOSIS — M62830 Muscle spasm of back: Secondary | ICD-10-CM | POA: Diagnosis not present

## 2023-06-17 DIAGNOSIS — M9904 Segmental and somatic dysfunction of sacral region: Secondary | ICD-10-CM | POA: Diagnosis not present

## 2023-06-17 DIAGNOSIS — M9905 Segmental and somatic dysfunction of pelvic region: Secondary | ICD-10-CM | POA: Diagnosis not present

## 2023-07-01 DIAGNOSIS — M9904 Segmental and somatic dysfunction of sacral region: Secondary | ICD-10-CM | POA: Diagnosis not present

## 2023-07-01 DIAGNOSIS — M62830 Muscle spasm of back: Secondary | ICD-10-CM | POA: Diagnosis not present

## 2023-07-01 DIAGNOSIS — M9903 Segmental and somatic dysfunction of lumbar region: Secondary | ICD-10-CM | POA: Diagnosis not present

## 2023-07-01 DIAGNOSIS — M9905 Segmental and somatic dysfunction of pelvic region: Secondary | ICD-10-CM | POA: Diagnosis not present

## 2023-07-10 ENCOUNTER — Ambulatory Visit (INDEPENDENT_AMBULATORY_CARE_PROVIDER_SITE_OTHER): Payer: PPO | Admitting: Family Medicine

## 2023-07-10 ENCOUNTER — Encounter: Payer: Self-pay | Admitting: Family Medicine

## 2023-07-10 VITALS — BP 130/88 | HR 88 | Temp 98.7°F | Ht 73.0 in | Wt 274.4 lb

## 2023-07-10 DIAGNOSIS — Z86718 Personal history of other venous thrombosis and embolism: Secondary | ICD-10-CM | POA: Diagnosis not present

## 2023-07-10 DIAGNOSIS — Z0001 Encounter for general adult medical examination with abnormal findings: Secondary | ICD-10-CM | POA: Diagnosis not present

## 2023-07-10 DIAGNOSIS — Z125 Encounter for screening for malignant neoplasm of prostate: Secondary | ICD-10-CM | POA: Diagnosis not present

## 2023-07-10 DIAGNOSIS — E7800 Pure hypercholesterolemia, unspecified: Secondary | ICD-10-CM

## 2023-07-10 DIAGNOSIS — Z0000 Encounter for general adult medical examination without abnormal findings: Secondary | ICD-10-CM

## 2023-07-10 NOTE — Progress Notes (Signed)
Subjective:    Patient ID: Ryan Turner, male    DOB: 1950-10-11, 72 y.o.   MRN: [id number]  HPI  Patient is a 72 year old Caucasian gentleman here today for complete physical exam.  Past medical history is significant for COVID, DVT, a pleural effusion that resulted in an empyema after COVID.  I recommended a flu shot, COVID shot, RSV, as well as Capvaxive.  Patient politely declines all vaccinations today.  Colonoscopy was performed in 2023.  They recommended a repeat colonoscopy in 2030.  At that time the patient will be 72 years old.  Therefore no further colonoscopy is recommended.  His PSA was checked recently which was normal.  He did has an elevation in his creatinine and liver function test that we suspect was due to trauma after a severe fall.  We have decided to repeat that today.  His cholesterol is also elevated.  Patient is currently taking fish oil but hesitates to take a statin at the present time. Immunization History  Administered Date(s) Administered   Fluad Quad(high Dose 65+) 04/12/2020   Influenza, High Dose Seasonal PF 04/17/2018, 05/19/2019, 05/22/2019   Influenza-Unspecified 03/22/2015, 03/01/2017   Pneumococcal Conjugate-13 01/21/2015   Pneumococcal Polysaccharide-23 07/22/2013   Tdap 10/23/2015   Zoster Recombinant(Shingrix) 05/22/2019, 09/25/2019   Zoster, Live 07/22/2013    Past Medical History:  Diagnosis Date   Cancer (HCC)    SKIN   Cataract 2014   removed   COVID-19    DDD (degenerative disc disease), lumbar    Diverticulosis    Empyema lung (HCC)    PT.DENIES 04/02/22   Headache    IBS (irritable bowel syndrome)    Sleep apnea    Past Surgical History:  Procedure Laterality Date   cataracts     COLON SURGERY  1995   fistula   COLONOSCOPY     EYE SURGERY  2014   catarats/lens replaced   mohs  2015   skin cancer from nose   PLEURA SAC     DRAINAGE   POLYPECTOMY     VASECTOMY  1985   Current Outpatient Medications on File Prior to  Visit  Medication Sig Dispense Refill   celecoxib (CELEBREX) 200 MG capsule Take 1 capsule (200 mg total) by mouth as needed. 30 capsule 0   Glucosamine-Chondroit-Vit C-Mn (GLUCOSAMINE CHONDROITIN COMPLX) CAPS Take 2 capsules by mouth daily. 1800 mg     Multiple Vitamins-Minerals (OCUVITE PRESERVISION PO) Take 2 tablets by mouth daily.      Omega-3 Fatty Acids (FISH OIL) 1200 MG CAPS Take 2 capsules by mouth daily.     sildenafil (VIAGRA) 100 MG tablet Take 0.5-1 tablets (50-100 mg total) by mouth daily as needed for erectile dysfunction. 5 tablet 11   zolpidem (AMBIEN) 10 MG tablet Take 0.5-1 tablets (5-10 mg total) by mouth at bedtime as needed. for sleep 30 tablet 2   hyoscyamine (LEVSIN SL) 0.125 MG SL tablet PLACE 1 TABLET UNDER THE TONGUE 4 (FOUR) TIMES DAILY AS NEEDED. (Patient not taking: Reported on 07/10/2023) 360 tablet 1   No current facility-administered medications on file prior to visit.   Allergies  Allergen Reactions   Morphine     Other reaction(s): VOMITING   Social History   Socioeconomic History   Marital status: Married    Spouse name: Not on file   Number of children: Not on file   Years of education: College   Highest education level: Not on file  Occupational History  Occupation: Programmer, systems  Tobacco Use   Smoking status: Former    Types: Cigars    Quit date: 01/28/2020    Years since quitting: 3.4    Passive exposure: Past   Smokeless tobacco: Never   Tobacco comments:    quit cigarettes in 1973  Vaping Use   Vaping status: Never Used  Substance and Sexual Activity   Alcohol use: Not Currently    Alcohol/week: 6.0 standard drinks of alcohol    Types: 2 Cans of beer, 4 Shots of liquor per week   Drug use: No   Sexual activity: Yes    Birth control/protection: None  Other Topics Concern   Not on file  Social History Narrative   Lives at home with wife.   Social Drivers of Corporate investment banker Strain: Low Risk  (01/11/2021)   Overall  Financial Resource Strain (CARDIA)    Difficulty of Paying Living Expenses: Not hard at all  Food Insecurity: No Food Insecurity (01/11/2021)   Hunger Vital Sign    Worried About Running Out of Food in the Last Year: Never true    Ran Out of Food in the Last Year: Never true  Transportation Needs: No Transportation Needs (01/11/2021)   PRAPARE - Administrator, Civil Service (Medical): No    Lack of Transportation (Non-Medical): No  Physical Activity: Insufficiently Active (01/11/2021)   Exercise Vital Sign    Days of Exercise per Week: 2 days    Minutes of Exercise per Session: 40 min  Stress: No Stress Concern Present (01/11/2021)   [employer] of Occupational Health - Occupational Stress Questionnaire    Feeling of Stress : Not at all  Social Connections: Moderately Integrated (01/11/2021)   Social Connection and Isolation Panel [NHANES]    Frequency of Communication with Friends and Family: More than three times a week    Frequency of Social Gatherings with Friends and Family: More than three times a week    Attends Religious Services: Never    Database administrator or Organizations: Yes    Attends Engineer, structural: More than 4 times per year    Marital Status: Married  Catering manager Violence: Not At Risk (01/11/2021)   Humiliation, Afraid, Rape, and Kick questionnaire    Fear of Current or Ex-Partner: No    Emotionally Abused: No    Physically Abused: No    Sexually Abused: No   Family History  Problem Relation Age of Onset   Cancer Mother        lung (unknown primary)   Cancer Father        lung mets to spine   Cancer Brother        melanoma   Leukemia Maternal Grandfather    Stomach cancer Paternal Grandmother    Colon cancer Neg Hx    Colon polyps Neg Hx    Diabetes Neg Hx    Esophageal cancer Neg Hx    Rectal cancer Neg Hx    Crohn's disease Neg Hx    Ulcerative colitis Neg Hx      Review of Systems  All other systems reviewed  and are negative.      Objective:   Physical Exam Vitals reviewed.  Constitutional:      General: He is not in acute distress.    Appearance: He is well-developed. He is not diaphoretic.  HENT:     Head: Normocephalic and atraumatic.     Right Ear: External  ear normal.     Left Ear: External ear normal.     Nose: Nose normal.     Mouth/Throat:     Pharynx: No oropharyngeal exudate.  Eyes:     General: No scleral icterus.       Right eye: No discharge.        Left eye: No discharge.     Conjunctiva/sclera: Conjunctivae normal.     Pupils: Pupils are equal, round, and reactive to light.  Neck:     Thyroid: No thyromegaly.     Vascular: No JVD.     Trachea: No tracheal deviation.  Cardiovascular:     Rate and Rhythm: Normal rate and regular rhythm.     Heart sounds: Normal heart sounds. No murmur heard.    No friction rub. No gallop.  Pulmonary:     Effort: Pulmonary effort is normal. No respiratory distress.     Breath sounds: Normal breath sounds. No stridor. No wheezing or rales.  Chest:     Chest wall: No tenderness.  Abdominal:     General: Bowel sounds are normal. There is no distension.     Palpations: Abdomen is soft. There is no mass.     Tenderness: There is no abdominal tenderness. There is no guarding or rebound.  Musculoskeletal:        General: No tenderness. Normal range of motion.     Cervical back: Normal range of motion and neck supple.  Lymphadenopathy:     Cervical: No cervical adenopathy.  Skin:    General: Skin is warm.     Coloration: Skin is not pale.     Findings: No erythema or rash.  Neurological:     Mental Status: He is alert and oriented to person, place, and time.     Cranial Nerves: No cranial nerve deficit.     Motor: No abnormal muscle tone.     Coordination: Coordination normal.     Deep Tendon Reflexes: Reflexes are normal and symmetric.  Psychiatric:        Behavior: Behavior normal.        Thought Content: Thought content  normal.        Judgment: Judgment normal.          Assessment & Plan:  Pure hypercholesterolemia - Plan: CT CARDIAC SCORING (SELF PAY ONLY), COMPLETE METABOLIC PANEL WITH GFR  History of DVT in adulthood  Prostate cancer screening  General medical exam Patient politely declines vaccinations that I mentioned in the history of present illness.  Colonoscopy and prostate cancer screening are up-to-date.  I recommended a statin.  After discussing it, the patient would like to proceed with a coronary artery CT scan to see if he has an elevated coronary artery calcium score.  If so, he would then take a statin.  There were calcifications seen on a CT scan in 2021.

## 2023-07-11 ENCOUNTER — Encounter: Payer: Self-pay | Admitting: Family Medicine

## 2023-07-11 LAB — COMPLETE METABOLIC PANEL WITH GFR
AG Ratio: 2.4 (calc) (ref 1.0–2.5)
ALT: 24 U/L (ref 9–46)
AST: 20 U/L (ref 10–35)
Albumin: 4.5 g/dL (ref 3.6–5.1)
Alkaline phosphatase (APISO): 75 U/L (ref 35–144)
BUN: 18 mg/dL (ref 7–25)
CO2: 28 mmol/L (ref 20–32)
Calcium: 9.1 mg/dL (ref 8.6–10.3)
Chloride: 102 mmol/L (ref 98–110)
Creat: 0.85 mg/dL (ref 0.70–1.28)
Globulin: 1.9 g/dL (ref 1.9–3.7)
Glucose, Bld: 96 mg/dL (ref 65–99)
Potassium: 4.9 mmol/L (ref 3.5–5.3)
Sodium: 138 mmol/L (ref 135–146)
Total Bilirubin: 0.7 mg/dL (ref 0.2–1.2)
Total Protein: 6.4 g/dL (ref 6.1–8.1)
eGFR: 91 mL/min/{1.73_m2} (ref >=60)

## 2023-07-15 ENCOUNTER — Ambulatory Visit (HOSPITAL_COMMUNITY)
Admission: RE | Admit: 2023-07-15 | Discharge: 2023-07-15 | Disposition: A | Discharge status: Home / Self Care | Payer: Self-pay | Source: Ambulatory Visit | Attending: Family Medicine | Admitting: Family Medicine

## 2023-07-15 DIAGNOSIS — M9905 Segmental and somatic dysfunction of pelvic region: Secondary | ICD-10-CM | POA: Diagnosis not present

## 2023-07-15 DIAGNOSIS — M9903 Segmental and somatic dysfunction of lumbar region: Secondary | ICD-10-CM | POA: Diagnosis not present

## 2023-07-15 DIAGNOSIS — E7800 Pure hypercholesterolemia, unspecified: Secondary | ICD-10-CM | POA: Insufficient documentation

## 2023-07-15 DIAGNOSIS — M62830 Muscle spasm of back: Secondary | ICD-10-CM | POA: Diagnosis not present

## 2023-07-15 DIAGNOSIS — M9904 Segmental and somatic dysfunction of sacral region: Secondary | ICD-10-CM | POA: Diagnosis not present

## 2023-07-21 ENCOUNTER — Other Ambulatory Visit: Payer: Self-pay

## 2023-07-21 DIAGNOSIS — E7800 Pure hypercholesterolemia, unspecified: Secondary | ICD-10-CM

## 2023-07-21 MED ORDER — ROSUVASTATIN CALCIUM 20 MG PO TABS: 20.0000 mg | ORAL_TABLET | Freq: Every day | ORAL | 3 refills | Status: DC

## 2023-08-25 ENCOUNTER — Encounter: Payer: Self-pay | Admitting: Family Medicine

## 2023-08-26 ENCOUNTER — Other Ambulatory Visit: Payer: Self-pay | Admitting: Family Medicine

## 2023-08-26 MED ORDER — ZOLPIDEM TARTRATE 10 MG PO TABS: 5.0000 mg | ORAL_TABLET | Freq: Every evening | ORAL | 5 refills | Status: DC | PRN

## 2023-10-09 DIAGNOSIS — M62830 Muscle spasm of back: Secondary | ICD-10-CM | POA: Diagnosis not present

## 2023-10-09 DIAGNOSIS — M9903 Segmental and somatic dysfunction of lumbar region: Secondary | ICD-10-CM | POA: Diagnosis not present

## 2023-10-09 DIAGNOSIS — M9904 Segmental and somatic dysfunction of sacral region: Secondary | ICD-10-CM | POA: Diagnosis not present

## 2023-10-09 DIAGNOSIS — M9905 Segmental and somatic dysfunction of pelvic region: Secondary | ICD-10-CM | POA: Diagnosis not present

## 2023-10-21 DIAGNOSIS — M9901 Segmental and somatic dysfunction of cervical region: Secondary | ICD-10-CM | POA: Diagnosis not present

## 2023-10-21 DIAGNOSIS — M9903 Segmental and somatic dysfunction of lumbar region: Secondary | ICD-10-CM | POA: Diagnosis not present

## 2023-10-21 DIAGNOSIS — M9902 Segmental and somatic dysfunction of thoracic region: Secondary | ICD-10-CM | POA: Diagnosis not present

## 2023-10-21 DIAGNOSIS — M9904 Segmental and somatic dysfunction of sacral region: Secondary | ICD-10-CM | POA: Diagnosis not present

## 2023-11-03 ENCOUNTER — Ambulatory Visit: Admitting: Family Medicine

## 2023-11-03 VITALS — BP 130/88 | Ht 73.0 in | Wt 274.0 lb

## 2023-11-03 DIAGNOSIS — Z0000 Encounter for general adult medical examination without abnormal findings: Secondary | ICD-10-CM | POA: Diagnosis not present

## 2023-11-03 NOTE — Progress Notes (Signed)
 Subjective:   Ryan Turner is a 73 y.o. male who presents for Medicare Annual/Subsequent preventive examination.  Visit Complete: Virtual I connected with  [name] on 11/03/23 by a audio enabled telemedicine application and verified that I am speaking with the correct person using two identifiers.  Patient Location: Home  Provider Location: Home Office  I connected with  [name] on 11/03/23 by a audio enabled telemedicine application and verified that I am speaking with the correct person using two identifiers.  Patient Location: Home  Provider Location: Home Office  I discussed the limitations of evaluation and management by telemedicine. The patient expressed understanding and agreed to proceed.  I discussed the limitations of evaluation and management by telemedicine. The patient expressed understanding and agreed to proceed.  Vital Signs: Because this visit was a virtual/telehealth visit, some criteria may be missing or patient reported. Any vitals not documented were not able to be obtained and vitals that have been documented are patient reported.  Patient Medicare AWV questionnaire was completed by the patient on 11/03/2023; I have confirmed that all information answered by patient is correct and no changes since this date.  Cardiac Risk Factors include: advanced age (>70men, >32 women);hypertension;dyslipidemia;male gender;obesity (BMI >30kg/m2)     Objective:    Today's Vitals   11/03/23 0846  BP: 130/88  Weight: 274 lb (124.3 kg)  Height: 6\' 1"  (1.854 m)   Body mass index is 36.15 kg/m.     11/03/2023    8:57 AM 01/11/2021    3:04 PM 03/14/2020   12:32 PM 02/21/2020    4:51 PM  Advanced Directives  Does Patient Have a Medical Advance Directive? Yes Yes No Yes  Type of Special educational needs teacher of [place];Living will Living will Living will  Does patient want to make changes to medical advance directive?  No - Patient declined No  - Patient declined No - Patient declined  Copy of Healthcare Power of Attorney in Chart?  No - copy requested    Would patient like information on creating a medical advance directive?   No - Patient declined     Current Medications (verified) Outpatient Encounter Medications as of 11/03/2023  Medication Sig   celecoxib (CELEBREX) 200 MG capsule Take 1 capsule (200 mg total) by mouth as needed.   Glucosamine-Chondroit-Vit C-Mn (GLUCOSAMINE CHONDROITIN COMPLX) CAPS Take 2 capsules by mouth daily. 1800 mg   hyoscyamine (LEVSIN SL) 0.125 MG SL tablet PLACE 1 TABLET UNDER THE TONGUE 4 (FOUR) TIMES DAILY AS NEEDED.   Multiple Vitamins-Minerals (OCUVITE PRESERVISION PO) Take 2 tablets by mouth daily.    Omega-3 Fatty Acids (FISH OIL) 1200 MG CAPS Take 2 capsules by mouth daily.   rosuvastatin (CRESTOR) 20 MG tablet Take 1 tablet (20 mg total) by mouth daily.   sildenafil (VIAGRA) 100 MG tablet Take 0.5-1 tablets (50-100 mg total) by mouth daily as needed for erectile dysfunction.   zolpidem (AMBIEN) 10 MG tablet Take 0.5-1 tablets (5-10 mg total) by mouth at bedtime as needed. for sleep   No facility-administered encounter medications on file as of 11/03/2023.    Allergies (verified) Morphine   History: Past Medical History:  Diagnosis Date   Cancer [facility])    SKIN   Cataract 2014   removed   COVID-19    DDD (degenerative disc disease), lumbar    Diverticulosis    Empyema lung (HCC)    PT.DENIES 04/02/22   Headache    IBS (irritable bowel syndrome)  Sleep apnea    Past Surgical History:  Procedure Laterality Date   cataracts     COLON SURGERY  1995   fistula   COLONOSCOPY     EYE SURGERY  2014   catarats/lens replaced   mohs  2015   skin cancer from nose   PLEURA SAC     DRAINAGE   POLYPECTOMY     VASECTOMY  1985   Family History  Problem Relation Age of Onset   Cancer Mother        lung (unknown primary)   Cancer Father        lung mets to spine   Cancer Brother         melanoma   Leukemia Maternal Grandfather    Stomach cancer Paternal Grandmother    Colon cancer Neg Hx    Colon polyps Neg Hx    Diabetes Neg Hx    Esophageal cancer Neg Hx    Rectal cancer Neg Hx    Crohn's disease Neg Hx    Ulcerative colitis Neg Hx    Social History   Socioeconomic History   Marital status: Married    Spouse name: Not on file   Number of children: Not on file   Years of education: College   Highest education level: Bachelor's degree (e.g., BA, AB, BS)  Occupational History   Occupation: Broker  Tobacco Use   Smoking status: Former    Types: Cigars    Quit date: 01/28/2020    Years since quitting: 3.7    Passive exposure: Past   Smokeless tobacco: Never   Tobacco comments:    quit cigarettes in 1973  Vaping Use   Vaping status: Never Used  Substance and Sexual Activity   Alcohol use: Yes    Alcohol/week: 6.0 standard drinks of alcohol    Types: 2 Cans of beer, 4 Shots of liquor per week   Drug use: No   Sexual activity: Yes    Birth control/protection: None  Other Topics Concern   Not on file  Social History Narrative   Lives at home with wife.   Social Drivers of Corporate investment banker Strain: Low Risk  (10/31/2023)   Overall Financial Resource Strain (CARDIA)    Difficulty of Paying Living Expenses: Not hard at all  Food Insecurity: No Food Insecurity (10/31/2023)   Hunger Vital Sign    Worried About Running Out of Food in the Last Year: Never true    Ran Out of Food in the Last Year: Never true  Transportation Needs: No Transportation Needs (10/31/2023)   PRAPARE - Administrator, Civil Service (Medical): No    Lack of Transportation (Non-Medical): No  Physical Activity: Sufficiently Active (10/31/2023)   Exercise Vital Sign    Days of Exercise per Week: 4 days    Minutes of Exercise per Session: 40 min  Stress: No Stress Concern Present (10/31/2023)   [employer] of Occupational Health - Occupational Stress  Questionnaire    Feeling of Stress : Only a little  Social Connections: Socially Integrated (11/03/2023)   Social Connection and Isolation Panel [NHANES]    Frequency of Communication with Friends and Family: More than three times a week    Frequency of Social Gatherings with Friends and Family: More than three times a week    Attends Religious Services: 1 to 4 times per year    Active Member of [employer] or Organizations: Yes    Attends Club or  Organization Meetings: 1 to 4 times per year    Marital Status: Married    Tobacco Counseling Counseling given: Not Answered Tobacco comments: quit cigarettes in 1973   Clinical Intake:  Pre-visit preparation completed: Yes  Pain : No/denies pain     BMI - recorded: 36 Nutritional Status: BMI > 30  Obese Nutritional Risks: None Diabetes: No  How often do you need to have someone help you when you read instructions, pamphlets, or other written materials from your doctor or pharmacy?: 1 - Never What is the last grade level you completed in school?: bachelor degree  Interpreter Needed?: No      Activities of Daily Living    11/03/2023    8:50 AM  In your present state of health, do you have any difficulty performing the following activities:  Hearing? 0  Vision? 0  Difficulty concentrating or making decisions? 0  Walking or climbing stairs? 0  Dressing or bathing? 0  Doing errands, shopping? 0  Preparing Food and eating ? N  Using the Toilet? N  In the past six months, have you accidently leaked urine? N  Do you have problems with loss of bowel control? N  Managing your Medications? N  Managing your Finances? N  Housekeeping or managing your Housekeeping? N    Patient Care Team: Austine Lefort, MD as PCP - General (Family Medicine)  Indicate any recent Medical Services you may have received from other than [facility] providers in the past year (date may be approximate).     Assessment:   This is a routine wellness  examination for Ryan Turner.  Hearing/Vision screen No results found.   Goals Addressed             This Visit's Progress    Activity and Exercise Increased       Evidence-based guidance:  Review current exercise levels.  Assess patient perspective on exercise or activity level, barriers to increasing activity, motivation and readiness for change.  Recommend or set healthy exercise goal based on individual tolerance.  Encourage small steps toward making change in amount of exercise or activity.  Urge reduction of sedentary activities or screen time.  Promote group activities within the community or with family or support person.  Consider referral to rehabiliation therapist for assessment and exercise/activity plan.   Notes:       Depression Screen    11/03/2023    8:56 AM 07/10/2023   10:24 AM 06/25/2022    9:32 AM 06/18/2021    9:21 AM 01/11/2021    3:07 PM 04/03/2020    9:26 AM 07/14/2019    9:19 AM  PHQ 2/9 Scores  PHQ - 2 Score 0 0 0 0 0 0 0  PHQ- 9 Score  2  1       Fall Risk    11/03/2023    8:54 AM 07/10/2023   10:24 AM 06/25/2022    9:32 AM 06/18/2021    9:20 AM 01/11/2021    3:06 PM  Fall Risk   Falls in the past year? 0 0 0 0 0  Number falls in past yr: 0 0 0 0 0  Injury with Fall? 0 0 0  0  Risk for fall due to : No Fall Risks  No Fall Risks  No Fall Risks  Follow up Falls evaluation completed  Falls prevention discussed  Falls evaluation completed;Falls prevention discussed    MEDICARE RISK AT HOME: Medicare Risk at Home Any stairs in or  around the home?: No If so, are there any without handrails?: No Home free of loose throw rugs in walkways, pet beds, electrical cords, etc?: No Adequate lighting in your home to reduce risk of falls?: Yes Life alert?: No Use of a cane, walker or w/c?: No Grab bars in the bathroom?: Yes Shower chair or bench in shower?: Yes Elevated toilet seat or a handicapped toilet?: Yes  TIMED UP AND GO:  Was the test  performed?  No    Cognitive Function:        11/03/2023    8:59 AM  6CIT Screen  What Year? 0 points  What month? 0 points  What time? 0 points  Count back from 20 0 points  Months in reverse 0 points  Repeat phrase 2 points  Total Score 2 points    Immunizations Immunization History  Administered Date(s) Administered   Fluad Quad(high Dose 65+) 04/12/2020   Influenza, High Dose Seasonal PF 04/17/2018, 05/19/2019, 05/22/2019   Influenza-Unspecified 03/22/2015, 03/01/2017   Pneumococcal Conjugate-13 01/21/2015   Pneumococcal Polysaccharide-23 07/22/2013   Tdap 10/23/2015   Zoster Recombinant(Shingrix) 05/22/2019, 09/25/2019   Zoster, Live 07/22/2013    TDAP status: Up to date  Flu Vaccine status: Declined, Education has been provided regarding the importance of this vaccine but patient still declined. Advised may receive this vaccine at local pharmacy or Health Dept. Aware to provide a copy of the vaccination record if obtained from local pharmacy or Health Dept. Verbalized acceptance and understanding.  Pneumococcal vaccine status: Declined,  Education has been provided regarding the importance of this vaccine but patient still declined. Advised may receive this vaccine at local pharmacy or Health Dept. Aware to provide a copy of the vaccination record if obtained from local pharmacy or Health Dept. Verbalized acceptance and understanding.   Covid-19 vaccine status: Declined, Education has been provided regarding the importance of this vaccine but patient still declined. Advised may receive this vaccine at local pharmacy or Health Dept.or vaccine clinic. Aware to provide a copy of the vaccination record if obtained from local pharmacy or Health Dept. Verbalized acceptance and understanding.  Qualifies for Shingles Vaccine? Yes   Zostavax completed Yes   Shingrix Completed?: Yes  Screening Tests Health Maintenance  Topic Date Due   Pneumonia Vaccine 48+ Years old (3 of 3 -  PCV20 or PCV21) 01/21/2020   Medicare Annual Wellness (AWV)  11/02/2024   DTaP/Tdap/Td (2 - Td or Tdap) 10/22/2025   Colonoscopy  05/16/2029   Hepatitis C Screening  Completed   Zoster Vaccines- Shingrix  Completed   HPV VACCINES  Aged Out   Meningococcal B Vaccine  Aged Out   INFLUENZA VACCINE  Discontinued   COVID-19 Vaccine  Discontinued    Health Maintenance  Health Maintenance Due  Topic Date Due   Pneumonia Vaccine 17+ Years old (3 of 3 - PCV20 or PCV21) 01/21/2020    Colorectal cancer screening: Type of screening: Colonoscopy. Completed 05/16/2022. Repeat every 10 years  Lung Cancer Screening: (Low Dose CT Chest recommended if Age 20-80 years, 20 pack-year currently smoking OR have quit w/in 15years.) does not qualify.   Lung Cancer Screening Referral: na  Additional Screening:  Hepatitis C Screening: does qualify; Completed yes  Vision Screening: Recommended annual ophthalmology exams for early detection of glaucoma and other disorders of the eye. Is the patient up to date with their annual eye exam?  Yes  Who is the provider or what is the name of the office in which  the patient attends annual eye exams? Dr Barbra Boone If pt is not established with a provider, would they like to be referred to a provider to establish care? Yes .   Dental Screening: Recommended annual dental exams for proper oral hygiene  Diabetic Foot Exam:   Community Resource Referral / Chronic Care Management: CRR required this visit?  No   CCM required this visit?  No     Plan:     I have personally reviewed and noted the following in the patient's chart:   Medical and social history Use of alcohol, tobacco or illicit drugs  Current medications and supplements including opioid prescriptions. Patient is not currently taking opioid prescriptions. Functional ability and status Nutritional status Physical activity Advanced directives List of other physicians Hospitalizations, surgeries,  and ER visits in previous 12 months Vitals Screenings to include cognitive, depression, and falls Referrals and appointments  In addition, I have reviewed and discussed with patient certain preventive protocols, quality metrics, and best practice recommendations. A written personalized care plan for preventive services as well as general preventive health recommendations were provided to patient.     Alvina Axon   11/03/2023   After Visit Summary: (MyChart) Due to this being a telephonic visit, the after visit summary with patients personalized plan was offered to patient via MyChart   Nurse Notes:   Mr. Licciardi , Thank you for taking time to come for your Medicare Wellness Visit. I appreciate your ongoing commitment to your health goals. Please review the following plan we discussed and let me know if I can assist you in the future.   These are the goals we discussed:  Goals      Activity and Exercise Increased     Evidence-based guidance:  Review current exercise levels.  Assess patient perspective on exercise or activity level, barriers to increasing activity, motivation and readiness for change.  Recommend or set healthy exercise goal based on individual tolerance.  Encourage small steps toward making change in amount of exercise or activity.  Urge reduction of sedentary activities or screen time.  Promote group activities within the community or with family or support person.  Consider referral to rehabiliation therapist for assessment and exercise/activity plan.   Notes:      Exercise 150 min/wk Moderate Activity        This is a list of the screening recommended for you and due dates:  Health Maintenance  Topic Date Due   Pneumonia Vaccine (3 of 3 - PCV20 or PCV21) 01/21/2020   Medicare Annual Wellness Visit  11/02/2024   DTaP/Tdap/Td vaccine (2 - Td or Tdap) 10/22/2025   Colon Cancer Screening  05/16/2029   Hepatitis C Screening  Completed   Zoster (Shingles)  Vaccine  Completed   HPV Vaccine  Aged Out   Meningitis B Vaccine  Aged Out   Flu Shot  Discontinued   COVID-19 Vaccine  Discontinued

## 2023-11-03 NOTE — Patient Instructions (Signed)
 Health Maintenance, Male  Adopting a healthy lifestyle and getting preventive care are important in promoting health and wellness. Ask your health care provider about:  The right schedule for you to have regular tests and exams.  Things you can do on your own to prevent diseases and keep yourself healthy.  What should I know about diet, weight, and exercise?  Eat a healthy diet    Eat a diet that includes plenty of vegetables, fruits, low-fat dairy products, and lean protein.  Do not eat a lot of foods that are high in solid fats, added sugars, or sodium.  Maintain a healthy weight  Body mass index (BMI) is a measurement that can be used to identify possible weight problems. It estimates body fat based on height and weight. Your health care provider can help determine your BMI and help you achieve or maintain a healthy weight.  Get regular exercise  Get regular exercise. This is one of the most important things you can do for your health. Most adults should:  Exercise for at least 150 minutes each week. The exercise should increase your heart rate and make you sweat (moderate-intensity exercise).  Do strengthening exercises at least twice a week. This is in addition to the moderate-intensity exercise.  Spend less time sitting. Even light physical activity can be beneficial.  Watch cholesterol and blood lipids  Have your blood tested for lipids and cholesterol at 75 years of age, then have this test every 5 years.  You may need to have your cholesterol levels checked more often if:  Your lipid or cholesterol levels are high.  You are older than 75 years of age.  You are at high risk for heart disease.  What should I know about cancer screening?  Many types of cancers can be detected early and may often be prevented. Depending on your health history and family history, you may need to have cancer screening at various ages. This may include screening for:  Colorectal cancer.  Prostate cancer.  Skin cancer.  Lung  cancer.  What should I know about heart disease, diabetes, and high blood pressure?  Blood pressure and heart disease  High blood pressure causes heart disease and increases the risk of stroke. This is more likely to develop in people who have high blood pressure readings or are overweight.  Talk with your health care provider about your target blood pressure readings.  Have your blood pressure checked:  Every 3-5 years if you are 9-95 years of age.  Every year if you are 85 years old or older.  If you are between the ages of 29 and 29 and are a current or former smoker, ask your health care provider if you should have a one-time screening for abdominal aortic aneurysm (AAA).  Diabetes  Have regular diabetes screenings. This checks your fasting blood sugar level. Have the screening done:  Once every three years after age 23 if you are at a normal weight and have a low risk for diabetes.  More often and at a younger age if you are overweight or have a high risk for diabetes.  What should I know about preventing infection?  Hepatitis B  If you have a higher risk for hepatitis B, you should be screened for this virus. Talk with your health care provider to find out if you are at risk for hepatitis B infection.  Hepatitis C  Blood testing is recommended for:  Everyone born from [birth year] through 1967.  Anyone  with known risk factors for hepatitis C.  Sexually transmitted infections (STIs)  You should be screened each year for STIs, including gonorrhea and chlamydia, if:  You are sexually active and are younger than 75 years of age.  You are older than 75 years of age and your health care provider tells you that you are at risk for this type of infection.  Your sexual activity has changed since you were last screened, and you are at increased risk for chlamydia or gonorrhea. Ask your health care provider if you are at risk.  Ask your health care provider about whether you are at high risk for HIV. Your health care provider  may recommend a prescription medicine to help prevent HIV infection. If you choose to take medicine to prevent HIV, you should first get tested for HIV. You should then be tested every 3 months for as long as you are taking the medicine.  Follow these instructions at home:  Alcohol use  Do not drink alcohol if your health care provider tells you not to drink.  If you drink alcohol:  Limit how much you have to 0-2 drinks a day.  Know how much alcohol is in your drink. In the [place] one drink equals one 12 oz bottle of beer (355 mL), one 5 oz glass of wine (148 mL), or one 1 oz glass of hard liquor (44 mL).  Lifestyle  Do not use any products that contain nicotine or tobacco. These products include cigarettes, chewing tobacco, and vaping devices, such as e-cigarettes. If you need help quitting, ask your health care provider.  Do not use street drugs.  Do not share needles.  Ask your health care provider for help if you need support or information about quitting drugs.  General instructions  Schedule regular health, dental, and eye exams.  Stay current with your vaccines.  Tell your health care provider if:  You often feel depressed.  You have ever been abused or do not feel safe at home.  Summary  Adopting a healthy lifestyle and getting preventive care are important in promoting health and wellness.  Follow your health care provider's instructions about healthy diet, exercising, and getting tested or screened for diseases.  Follow your health care provider's instructions on monitoring your cholesterol and blood pressure.  This information is not intended to replace advice given to you by your health care provider. Make sure you discuss any questions you have with your health care provider.  Document Revised: 11/27/2020 Document Reviewed: 11/27/2020  Elsevier Patient Education  2024 [employer].

## 2023-11-04 DIAGNOSIS — M9904 Segmental and somatic dysfunction of sacral region: Secondary | ICD-10-CM | POA: Diagnosis not present

## 2023-11-04 DIAGNOSIS — M9902 Segmental and somatic dysfunction of thoracic region: Secondary | ICD-10-CM | POA: Diagnosis not present

## 2023-11-04 DIAGNOSIS — M9903 Segmental and somatic dysfunction of lumbar region: Secondary | ICD-10-CM | POA: Diagnosis not present

## 2023-11-04 DIAGNOSIS — M9901 Segmental and somatic dysfunction of cervical region: Secondary | ICD-10-CM | POA: Diagnosis not present

## 2023-11-07 ENCOUNTER — Telehealth: Payer: Self-pay | Admitting: Family Medicine

## 2023-11-07 ENCOUNTER — Other Ambulatory Visit: Payer: Self-pay

## 2023-11-07 DIAGNOSIS — E7800 Pure hypercholesterolemia, unspecified: Secondary | ICD-10-CM

## 2023-11-07 MED ORDER — ROSUVASTATIN CALCIUM 20 MG PO TABS: 20.0000 mg | ORAL_TABLET | Freq: Every day | ORAL | 3 refills | Status: DC

## 2023-11-07 NOTE — Telephone Encounter (Signed)
 Prescription Request  11/07/2023  LOV: 11/03/2023  What is the name of the medication or equipment?   rosuvastatin  (CRESTOR ) 20 MG tablet  **90 day script requested**  Have you contacted your pharmacy to request a refill? Yes   Which pharmacy would you like this sent to?  [id number] - [place], [place] - [address] [place] [address] Phone: [phone] Fax: [phone]    Patient notified that their request is being sent to the clinical staff for review and that they should receive a response within 2 business days.   Please advise pharmacist.

## 2023-11-13 DIAGNOSIS — L814 Other melanin hyperpigmentation: Secondary | ICD-10-CM | POA: Diagnosis not present

## 2023-11-13 DIAGNOSIS — L578 Other skin changes due to chronic exposure to nonionizing radiation: Secondary | ICD-10-CM | POA: Diagnosis not present

## 2023-11-13 DIAGNOSIS — D225 Melanocytic nevi of trunk: Secondary | ICD-10-CM | POA: Diagnosis not present

## 2023-11-13 DIAGNOSIS — L570 Actinic keratosis: Secondary | ICD-10-CM | POA: Diagnosis not present

## 2023-11-13 DIAGNOSIS — L218 Other seborrheic dermatitis: Secondary | ICD-10-CM | POA: Diagnosis not present

## 2023-11-13 DIAGNOSIS — L821 Other seborrheic keratosis: Secondary | ICD-10-CM | POA: Diagnosis not present

## 2023-11-18 DIAGNOSIS — M9902 Segmental and somatic dysfunction of thoracic region: Secondary | ICD-10-CM | POA: Diagnosis not present

## 2023-11-18 DIAGNOSIS — M9903 Segmental and somatic dysfunction of lumbar region: Secondary | ICD-10-CM | POA: Diagnosis not present

## 2023-11-18 DIAGNOSIS — M9904 Segmental and somatic dysfunction of sacral region: Secondary | ICD-10-CM | POA: Diagnosis not present

## 2023-11-18 DIAGNOSIS — M9901 Segmental and somatic dysfunction of cervical region: Secondary | ICD-10-CM | POA: Diagnosis not present

## 2023-12-01 DIAGNOSIS — H43392 Other vitreous opacities, left eye: Secondary | ICD-10-CM | POA: Diagnosis not present

## 2023-12-09 DIAGNOSIS — M9901 Segmental and somatic dysfunction of cervical region: Secondary | ICD-10-CM | POA: Diagnosis not present

## 2023-12-09 DIAGNOSIS — M9903 Segmental and somatic dysfunction of lumbar region: Secondary | ICD-10-CM | POA: Diagnosis not present

## 2023-12-09 DIAGNOSIS — M9902 Segmental and somatic dysfunction of thoracic region: Secondary | ICD-10-CM | POA: Diagnosis not present

## 2023-12-09 DIAGNOSIS — M9904 Segmental and somatic dysfunction of sacral region: Secondary | ICD-10-CM | POA: Diagnosis not present

## 2023-12-23 DIAGNOSIS — M9901 Segmental and somatic dysfunction of cervical region: Secondary | ICD-10-CM | POA: Diagnosis not present

## 2023-12-23 DIAGNOSIS — M9902 Segmental and somatic dysfunction of thoracic region: Secondary | ICD-10-CM | POA: Diagnosis not present

## 2023-12-23 DIAGNOSIS — M5451 Vertebrogenic low back pain: Secondary | ICD-10-CM | POA: Diagnosis not present

## 2023-12-23 DIAGNOSIS — M5441 Lumbago with sciatica, right side: Secondary | ICD-10-CM | POA: Diagnosis not present

## 2024-01-06 DIAGNOSIS — M5451 Vertebrogenic low back pain: Secondary | ICD-10-CM | POA: Diagnosis not present

## 2024-01-06 DIAGNOSIS — M9901 Segmental and somatic dysfunction of cervical region: Secondary | ICD-10-CM | POA: Diagnosis not present

## 2024-01-06 DIAGNOSIS — M9902 Segmental and somatic dysfunction of thoracic region: Secondary | ICD-10-CM | POA: Diagnosis not present

## 2024-01-06 DIAGNOSIS — M5441 Lumbago with sciatica, right side: Secondary | ICD-10-CM | POA: Diagnosis not present

## 2024-01-19 ENCOUNTER — Telehealth: Payer: Self-pay

## 2024-01-19 NOTE — Telephone Encounter (Signed)
 Copied from CRM [id number]. Topic: Clinical - Request for Lab/Test Order >> Jan 19, 2024  2:01 PM Essie A wrote: Reason for CRM: Patient is trying to scheduling a lab visit but there are no orders in his chart.  Please let him know when the order is complete so he can schedule his visit.  Call him [phone]

## 2024-01-20 ENCOUNTER — Other Ambulatory Visit

## 2024-01-20 DIAGNOSIS — M5441 Lumbago with sciatica, right side: Secondary | ICD-10-CM | POA: Diagnosis not present

## 2024-01-20 DIAGNOSIS — M9902 Segmental and somatic dysfunction of thoracic region: Secondary | ICD-10-CM | POA: Diagnosis not present

## 2024-01-20 DIAGNOSIS — M9901 Segmental and somatic dysfunction of cervical region: Secondary | ICD-10-CM | POA: Diagnosis not present

## 2024-01-20 DIAGNOSIS — M5451 Vertebrogenic low back pain: Secondary | ICD-10-CM | POA: Diagnosis not present

## 2024-01-20 NOTE — Telephone Encounter (Unsigned)
 Copied from CRM [id number]. Topic: Clinical - Request for Lab/Test Order >> Jan 19, 2024  4:52 PM Delon DASEN wrote: Reason for CRM: patient needs lab orders put in so he can schedule appt

## 2024-01-21 ENCOUNTER — Other Ambulatory Visit

## 2024-01-21 DIAGNOSIS — E7800 Pure hypercholesterolemia, unspecified: Secondary | ICD-10-CM

## 2024-01-21 LAB — LIPID PANEL
Cholesterol: 100 mg/dL (ref <200)
HDL: 44 mg/dL (ref >=40)
LDL Cholesterol (Calc): 37 mg/dL
Non-HDL Cholesterol (Calc): 56 mg/dL (ref <130)
Total CHOL/HDL Ratio: 2.3 (calc) (ref <5.0)
Triglycerides: 108 mg/dL (ref <150)

## 2024-01-22 ENCOUNTER — Ambulatory Visit: Payer: Self-pay | Admitting: Family Medicine

## 2024-02-01 ENCOUNTER — Other Ambulatory Visit: Payer: Self-pay | Admitting: Family Medicine

## 2024-02-01 DIAGNOSIS — E7800 Pure hypercholesterolemia, unspecified: Secondary | ICD-10-CM

## 2024-02-03 DIAGNOSIS — M9901 Segmental and somatic dysfunction of cervical region: Secondary | ICD-10-CM | POA: Diagnosis not present

## 2024-02-03 DIAGNOSIS — M9902 Segmental and somatic dysfunction of thoracic region: Secondary | ICD-10-CM | POA: Diagnosis not present

## 2024-02-03 DIAGNOSIS — M5451 Vertebrogenic low back pain: Secondary | ICD-10-CM | POA: Diagnosis not present

## 2024-02-03 DIAGNOSIS — M5441 Lumbago with sciatica, right side: Secondary | ICD-10-CM | POA: Diagnosis not present

## 2024-02-10 DIAGNOSIS — M9902 Segmental and somatic dysfunction of thoracic region: Secondary | ICD-10-CM | POA: Diagnosis not present

## 2024-02-10 DIAGNOSIS — M5441 Lumbago with sciatica, right side: Secondary | ICD-10-CM | POA: Diagnosis not present

## 2024-02-10 DIAGNOSIS — M9901 Segmental and somatic dysfunction of cervical region: Secondary | ICD-10-CM | POA: Diagnosis not present

## 2024-02-10 DIAGNOSIS — M5451 Vertebrogenic low back pain: Secondary | ICD-10-CM | POA: Diagnosis not present

## 2024-02-24 DIAGNOSIS — M5451 Vertebrogenic low back pain: Secondary | ICD-10-CM | POA: Diagnosis not present

## 2024-02-24 DIAGNOSIS — M5441 Lumbago with sciatica, right side: Secondary | ICD-10-CM | POA: Diagnosis not present

## 2024-02-24 DIAGNOSIS — M9902 Segmental and somatic dysfunction of thoracic region: Secondary | ICD-10-CM | POA: Diagnosis not present

## 2024-02-24 DIAGNOSIS — M9901 Segmental and somatic dysfunction of cervical region: Secondary | ICD-10-CM | POA: Diagnosis not present

## 2024-03-09 DIAGNOSIS — M5441 Lumbago with sciatica, right side: Secondary | ICD-10-CM | POA: Diagnosis not present

## 2024-03-09 DIAGNOSIS — M9901 Segmental and somatic dysfunction of cervical region: Secondary | ICD-10-CM | POA: Diagnosis not present

## 2024-03-09 DIAGNOSIS — M5451 Vertebrogenic low back pain: Secondary | ICD-10-CM | POA: Diagnosis not present

## 2024-03-09 DIAGNOSIS — M9902 Segmental and somatic dysfunction of thoracic region: Secondary | ICD-10-CM | POA: Diagnosis not present

## 2024-03-23 DIAGNOSIS — M5451 Vertebrogenic low back pain: Secondary | ICD-10-CM | POA: Diagnosis not present

## 2024-03-23 DIAGNOSIS — M5441 Lumbago with sciatica, right side: Secondary | ICD-10-CM | POA: Diagnosis not present

## 2024-03-23 DIAGNOSIS — M9902 Segmental and somatic dysfunction of thoracic region: Secondary | ICD-10-CM | POA: Diagnosis not present

## 2024-03-23 DIAGNOSIS — M9901 Segmental and somatic dysfunction of cervical region: Secondary | ICD-10-CM | POA: Diagnosis not present

## 2024-03-26 ENCOUNTER — Ambulatory Visit (INDEPENDENT_AMBULATORY_CARE_PROVIDER_SITE_OTHER): Admitting: Podiatry

## 2024-03-26 DIAGNOSIS — L600 Ingrowing nail: Secondary | ICD-10-CM | POA: Diagnosis not present

## 2024-03-26 NOTE — Patient Instructions (Signed)

## 2024-03-29 NOTE — Progress Notes (Signed)
 Subjective:   Patient ID: Ryan Turner, male   DOB: 73 y.o.   MRN: [id number]   HPI Patient presents with a painful ingrown toenail deformity of the third right and states that he has tried to trim and soak it without relief   ROS      Objective:  Physical Exam  Neurovascular status intact muscle strength found to be adequate range of motion within normal limits with patient noted to have incurvated medial border of the third nail right painful when pressed inability to wear shoe gear with any degree of comfort no proximal edema erythema or drainage noted     Assessment:  In grown toenail deformity third right incurvated     Plan:  H&P reviewed recommended correction of deformity and explained procedure risk.  Today I infiltrated the right third toe 60 mg like Marcaine mixture sterile prep done and using sterile instrumentation removed the third medial border.  I then did 3 applications of phenol and alcohol lavage after this.  This was done after patient read consent form understanding risk.  I then applied sterile dressing and I instructed on leaving dressing on 24 hours and taking it off early if throbbing were to occur and encouraged to call with questions concerns

## 2024-04-06 DIAGNOSIS — M9902 Segmental and somatic dysfunction of thoracic region: Secondary | ICD-10-CM | POA: Diagnosis not present

## 2024-04-06 DIAGNOSIS — M9901 Segmental and somatic dysfunction of cervical region: Secondary | ICD-10-CM | POA: Diagnosis not present

## 2024-04-06 DIAGNOSIS — M5451 Vertebrogenic low back pain: Secondary | ICD-10-CM | POA: Diagnosis not present

## 2024-04-06 DIAGNOSIS — M5441 Lumbago with sciatica, right side: Secondary | ICD-10-CM | POA: Diagnosis not present

## 2024-04-13 ENCOUNTER — Encounter: Payer: Self-pay | Admitting: Family Medicine

## 2024-04-13 ENCOUNTER — Ambulatory Visit (INDEPENDENT_AMBULATORY_CARE_PROVIDER_SITE_OTHER): Admitting: Family Medicine

## 2024-04-13 VITALS — BP 138/88 | HR 84 | Temp 98.2°F | Ht 73.0 in | Wt 285.4 lb

## 2024-04-13 DIAGNOSIS — R0609 Other forms of dyspnea: Secondary | ICD-10-CM | POA: Diagnosis not present

## 2024-04-13 DIAGNOSIS — M5451 Vertebrogenic low back pain: Secondary | ICD-10-CM | POA: Diagnosis not present

## 2024-04-13 DIAGNOSIS — M9901 Segmental and somatic dysfunction of cervical region: Secondary | ICD-10-CM | POA: Diagnosis not present

## 2024-04-13 DIAGNOSIS — Z8669 Personal history of other diseases of the nervous system and sense organs: Secondary | ICD-10-CM | POA: Diagnosis not present

## 2024-04-13 DIAGNOSIS — M5441 Lumbago with sciatica, right side: Secondary | ICD-10-CM | POA: Diagnosis not present

## 2024-04-13 DIAGNOSIS — M9902 Segmental and somatic dysfunction of thoracic region: Secondary | ICD-10-CM | POA: Diagnosis not present

## 2024-04-13 MED ORDER — ZOLPIDEM TARTRATE 10 MG PO TABS: 5.0000 mg | ORAL_TABLET | Freq: Every evening | ORAL | 5 refills | Status: AC | PRN

## 2024-04-13 NOTE — Progress Notes (Signed)
 Subjective:    Patient ID: Ryan Turner, male    DOB: [birth year], 73 y.o.   MRN: [id number]  HPI  Patient is here today to discuss weight loss options.  Patient had a coronary artery calcium  score last year: Coronary Calcium  Score:   Left main: 0   Left anterior descending artery: 372   Left circumflex artery: 253   Right coronary artery: 85   Total: 710   Percentile: 73 This confirmed the presence of coronary artery disease.  But the scores were not high enough to suggest flow-limiting lesions.  He has been having a difficult time losing weight and he has been experiencing dyspnea on exertion with activity.  He denies any orthopnea or paroxysmal nocturnal dyspnea.  He also has a remote history of sleep apnea and wears a CPAP machine although I do not have a documented sleep study.  He states that as long as he wears a CPAP machine, he does not experience hypersomnolence.  He is interested in using Zepbound or Mounjaro for weight loss. Immunization History  Administered Date(s) Administered   Fluad Quad(high Dose 65+) 04/12/2020   INFLUENZA, HIGH DOSE SEASONAL PF 04/17/2018, 05/19/2019, 05/22/2019   Influenza-Unspecified 03/22/2015, 03/01/2017   Pneumococcal Conjugate-13 01/21/2015   Pneumococcal Polysaccharide-23 07/22/2013   Tdap 10/23/2015   Zoster Recombinant(Shingrix) 05/22/2019, 09/25/2019   Zoster, Live 07/22/2013    Past Medical History:  Diagnosis Date   Cancer (HCC)    SKIN   Cataract 2014   removed   COVID-19    DDD (degenerative disc disease), lumbar    Diverticulosis    Empyema lung (HCC)    PT.DENIES 04/02/22   Headache    IBS (irritable bowel syndrome)    Sleep apnea    Past Surgical History:  Procedure Laterality Date   cataracts     COLON SURGERY  1995   fistula   COLONOSCOPY     EYE SURGERY  2014   catarats/lens replaced   mohs  2015   skin cancer from nose   PLEURA SAC     DRAINAGE   POLYPECTOMY     VASECTOMY  1985   Current  Outpatient Medications on File Prior to Visit  Medication Sig Dispense Refill   celecoxib  (CELEBREX ) 200 MG capsule Take 1 capsule (200 mg total) by mouth as needed. 30 capsule 0   Glucosamine-Chondroit-Vit C-Mn (GLUCOSAMINE CHONDROITIN COMPLX) CAPS Take 2 capsules by mouth daily. 1800 mg     Multiple Vitamins-Minerals (OCUVITE PRESERVISION PO) Take 2 tablets by mouth daily.      Omega-3 Fatty Acids (FISH OIL) 1200 MG CAPS Take 2 capsules by mouth daily.     rosuvastatin  (CRESTOR ) 20 MG tablet TAKE 1 TABLET BY MOUTH EVERY DAY 90 tablet 1   sildenafil  (VIAGRA ) 100 MG tablet Take 0.5-1 tablets (50-100 mg total) by mouth daily as needed for erectile dysfunction. 5 tablet 11   hyoscyamine  (LEVSIN  SL) 0.125 MG SL tablet PLACE 1 TABLET UNDER THE TONGUE 4 (FOUR) TIMES DAILY AS NEEDED. (Patient not taking: Reported on 04/13/2024) 360 tablet 1   No current facility-administered medications on file prior to visit.   Allergies  Allergen Reactions   Morphine      Other reaction(s): VOMITING   Social History   Socioeconomic History   Marital status: Married    Spouse name: Not on file   Number of children: Not on file   Years of education: College   Highest education level: Bachelor's degree (e.g., BA, AB, BS)  Occupational History   Occupation: Broker  Tobacco Use   Smoking status: Former    Types: Cigars    Quit date: 01/28/2020    Years since quitting: 4.2    Passive exposure: Past   Smokeless tobacco: Never   Tobacco comments:    quit cigarettes in 1973  Vaping Use   Vaping status: Never Used  Substance and Sexual Activity   Alcohol use: Yes    Alcohol/week: 6.0 standard drinks of alcohol    Types: 2 Cans of beer, 4 Shots of liquor per week   Drug use: No   Sexual activity: Yes    Birth control/protection: None  Other Topics Concern   Not on file  Social History Narrative   Lives at home with wife.   Social Drivers of Corporate investment banker Strain: Low Risk  (10/31/2023)    Overall Financial Resource Strain (CARDIA)    Difficulty of Paying Living Expenses: Not hard at all  Food Insecurity: No Food Insecurity (10/31/2023)   Hunger Vital Sign    Worried About Running Out of Food in the Last Year: Never true    Ran Out of Food in the Last Year: Never true  Transportation Needs: No Transportation Needs (10/31/2023)   PRAPARE - Administrator, Civil Service (Medical): No    Lack of Transportation (Non-Medical): No  Physical Activity: Sufficiently Active (10/31/2023)   Exercise Vital Sign    Days of Exercise per Week: 4 days    Minutes of Exercise per Session: 40 min  Stress: No Stress Concern Present (10/31/2023)   [employer] of Occupational Health - Occupational Stress Questionnaire    Feeling of Stress : Only a little  Social Connections: Socially Integrated (11/03/2023)   Social Connection and Isolation Panel    Frequency of Communication with Friends and Family: More than three times a week    Frequency of Social Gatherings with Friends and Family: More than three times a week    Attends Religious Services: 1 to 4 times per year    Active Member of [employer] or Organizations: Yes    Attends Banker Meetings: 1 to 4 times per year    Marital Status: Married  Catering manager Violence: Not At Risk (11/03/2023)   Humiliation, Afraid, Rape, and Kick questionnaire    Fear of Current or Ex-Partner: No    Emotionally Abused: No    Physically Abused: No    Sexually Abused: No   Family History  Problem Relation Age of Onset   Cancer Mother        lung (unknown primary)   Cancer Father        lung mets to spine   Cancer Brother        melanoma   Leukemia Maternal Grandfather    Stomach cancer Paternal Grandmother    Colon cancer Neg Hx    Colon polyps Neg Hx    Diabetes Neg Hx    Esophageal cancer Neg Hx    Rectal cancer Neg Hx    Crohn's disease Neg Hx    Ulcerative colitis Neg Hx      Review of Systems  All other systems  reviewed and are negative.      Objective:   Physical Exam Vitals reviewed.  Constitutional:      General: He is not in acute distress.    Appearance: He is well-developed. He is not diaphoretic.  HENT:     Head: Normocephalic and atraumatic.  Right Ear: External ear normal.     Left Ear: External ear normal.     Nose: Nose normal.     Mouth/Throat:     Pharynx: No oropharyngeal exudate.  Eyes:     General: No scleral icterus.       Right eye: No discharge.        Left eye: No discharge.     Conjunctiva/sclera: Conjunctivae normal.     Pupils: Pupils are equal, round, and reactive to light.  Neck:     Thyroid : No thyromegaly.     Vascular: No JVD.     Trachea: No tracheal deviation.  Cardiovascular:     Rate and Rhythm: Normal rate and regular rhythm.     Heart sounds: Normal heart sounds. No murmur heard.    No friction rub. No gallop.  Pulmonary:     Effort: Pulmonary effort is normal. No respiratory distress.     Breath sounds: Normal breath sounds. No stridor. No wheezing or rales.  Chest:     Chest wall: No tenderness.  Abdominal:     General: Bowel sounds are normal. There is no distension.     Palpations: Abdomen is soft. There is no mass.     Tenderness: There is no abdominal tenderness. There is no guarding or rebound.  Musculoskeletal:        General: No tenderness. Normal range of motion.     Cervical back: Normal range of motion and neck supple.  Lymphadenopathy:     Cervical: No cervical adenopathy.  Skin:    General: Skin is warm.     Coloration: Skin is not pale.     Findings: No erythema or rash.  Neurological:     Mental Status: He is alert and oriented to person, place, and time.     Cranial Nerves: No cranial nerve deficit.     Motor: No abnormal muscle tone.     Coordination: Coordination normal.     Deep Tendon Reflexes: Reflexes are normal and symmetric.  Psychiatric:        Behavior: Behavior normal.        Thought Content: Thought  content normal.        Judgment: Judgment normal.          Assessment & Plan:  History of sleep apnea - Plan: Home sleep test  Dyspnea on exertion - Plan: ECHOCARDIOGRAM COMPLETE I do not feel that the patient has cardiac ischemia based on his coronary artery calcium  score however I will check an echocardiogram to evaluate his dyspnea on exertion.  I suspect his shortness of breath is due to deconditioning.  Patient is not a diabetic however we can perhaps get Mounjaro/Zepbound approved because of his history of sleep apnea and coronary artery disease.  Patient would like to do a home sleep study to confirm sleep apnea and if present we can petition his insurance for Zepbound.  I recommended that he call his insurance and see if they will cover Zepbound for sleep apnea

## 2024-04-26 DIAGNOSIS — G4730 Sleep apnea, unspecified: Secondary | ICD-10-CM | POA: Diagnosis not present

## 2024-04-27 DIAGNOSIS — M5441 Lumbago with sciatica, right side: Secondary | ICD-10-CM | POA: Diagnosis not present

## 2024-04-27 DIAGNOSIS — M5451 Vertebrogenic low back pain: Secondary | ICD-10-CM | POA: Diagnosis not present

## 2024-04-27 DIAGNOSIS — M9901 Segmental and somatic dysfunction of cervical region: Secondary | ICD-10-CM | POA: Diagnosis not present

## 2024-04-27 DIAGNOSIS — M9902 Segmental and somatic dysfunction of thoracic region: Secondary | ICD-10-CM | POA: Diagnosis not present

## 2024-05-06 ENCOUNTER — Other Ambulatory Visit (HOSPITAL_COMMUNITY)

## 2024-05-06 DIAGNOSIS — L570 Actinic keratosis: Secondary | ICD-10-CM | POA: Diagnosis not present

## 2024-05-07 ENCOUNTER — Ambulatory Visit (HOSPITAL_COMMUNITY)
Admission: RE | Admit: 2024-05-07 | Discharge: 2024-05-07 | Disposition: A | Discharge status: Home / Self Care | Source: Ambulatory Visit | Attending: Family Medicine | Admitting: Family Medicine

## 2024-05-07 DIAGNOSIS — R0609 Other forms of dyspnea: Secondary | ICD-10-CM

## 2024-05-07 LAB — ECHOCARDIOGRAM COMPLETE
AR max vel: 3.07 cm2
AV Area VTI: 2.83 cm2
AV Area mean vel: 3 cm2
AV Mean grad: 3 mmHg
AV Peak grad: 5.9 mmHg
Ao pk vel: 1.21 m/s
Area-P 1/2: 3.48 cm2
S' Lateral: 3.33 cm

## 2024-05-10 ENCOUNTER — Ambulatory Visit: Payer: Self-pay | Admitting: Family Medicine

## 2024-05-11 DIAGNOSIS — M5451 Vertebrogenic low back pain: Secondary | ICD-10-CM | POA: Diagnosis not present

## 2024-05-11 DIAGNOSIS — M9901 Segmental and somatic dysfunction of cervical region: Secondary | ICD-10-CM | POA: Diagnosis not present

## 2024-05-11 DIAGNOSIS — M9904 Segmental and somatic dysfunction of sacral region: Secondary | ICD-10-CM | POA: Diagnosis not present

## 2024-05-11 DIAGNOSIS — M5441 Lumbago with sciatica, right side: Secondary | ICD-10-CM | POA: Diagnosis not present

## 2024-05-11 DIAGNOSIS — M9903 Segmental and somatic dysfunction of lumbar region: Secondary | ICD-10-CM | POA: Diagnosis not present

## 2024-05-11 DIAGNOSIS — M9905 Segmental and somatic dysfunction of pelvic region: Secondary | ICD-10-CM | POA: Diagnosis not present

## 2024-05-11 DIAGNOSIS — M9902 Segmental and somatic dysfunction of thoracic region: Secondary | ICD-10-CM | POA: Diagnosis not present

## 2024-05-14 DIAGNOSIS — L821 Other seborrheic keratosis: Secondary | ICD-10-CM | POA: Diagnosis not present

## 2024-05-14 DIAGNOSIS — L814 Other melanin hyperpigmentation: Secondary | ICD-10-CM | POA: Diagnosis not present

## 2024-05-14 DIAGNOSIS — L570 Actinic keratosis: Secondary | ICD-10-CM | POA: Diagnosis not present

## 2024-05-14 DIAGNOSIS — D1801 Hemangioma of skin and subcutaneous tissue: Secondary | ICD-10-CM | POA: Diagnosis not present

## 2024-05-14 DIAGNOSIS — L538 Other specified erythematous conditions: Secondary | ICD-10-CM | POA: Diagnosis not present

## 2024-05-14 DIAGNOSIS — L820 Inflamed seborrheic keratosis: Secondary | ICD-10-CM | POA: Diagnosis not present

## 2024-05-18 ENCOUNTER — Other Ambulatory Visit: Payer: Self-pay | Admitting: Family Medicine

## 2024-05-18 ENCOUNTER — Encounter: Payer: Self-pay | Admitting: Family Medicine

## 2024-05-18 MED ORDER — ZEPBOUND 2.5 MG/0.5ML ~~LOC~~ SOAJ: 2.5000 mg | SUBCUTANEOUS | 1 refills | Status: DC

## 2024-05-19 NOTE — Telephone Encounter (Signed)
 Requested medication (s) are due for refill today: yes  Requested medication (s) are on the active medication list: yes  Last refill:  05/18/24  Future visit scheduled: no  Notes to clinic:  Pharmacy comment: Alternative Requested:NOT COVERED.      Requested Prescriptions  Pending Prescriptions Disp Refills   ZEPBOUND 2.5 MG/0.5ML Pen [Pharmacy Med Name: ZEPBOUND 2.5 MG/0.5 ML PEN]  1    Sig: Inject 2.5 mg into the skin once a week. Increase dose monthly as tolerated     Off-Protocol Failed - 05/19/2024  3:48 PM      Failed - Medication not assigned to a protocol, review manually.      Passed - Valid encounter within last 12 months    Recent Outpatient Visits           1 month ago History of sleep apnea   [place] [facility] Pickard, Butler DASEN, MD   6 months ago Encounter for [employer] annual wellness exam   [place] [facility] Duanne, Butler DASEN, MD   10 months ago Pure hypercholesterolemia   [place] [facility] Pickard, Butler DASEN, MD   1 year ago Fatigue, unspecified type   [place] [facility] Duanne Butler DASEN, MD   1 year ago Encounter for Medicare annual wellness exam   [place] [facility] Pickard, Butler DASEN, MD

## 2024-05-25 ENCOUNTER — Encounter: Payer: Self-pay | Admitting: Family Medicine

## 2024-05-25 DIAGNOSIS — M5441 Lumbago with sciatica, right side: Secondary | ICD-10-CM | POA: Diagnosis not present

## 2024-05-25 DIAGNOSIS — M9902 Segmental and somatic dysfunction of thoracic region: Secondary | ICD-10-CM | POA: Diagnosis not present

## 2024-05-25 DIAGNOSIS — M9905 Segmental and somatic dysfunction of pelvic region: Secondary | ICD-10-CM | POA: Diagnosis not present

## 2024-05-25 DIAGNOSIS — M9901 Segmental and somatic dysfunction of cervical region: Secondary | ICD-10-CM | POA: Diagnosis not present

## 2024-05-25 DIAGNOSIS — M5451 Vertebrogenic low back pain: Secondary | ICD-10-CM | POA: Diagnosis not present

## 2024-05-25 DIAGNOSIS — M9904 Segmental and somatic dysfunction of sacral region: Secondary | ICD-10-CM | POA: Diagnosis not present

## 2024-05-25 DIAGNOSIS — M9903 Segmental and somatic dysfunction of lumbar region: Secondary | ICD-10-CM | POA: Diagnosis not present

## 2024-05-27 ENCOUNTER — Telehealth: Payer: Self-pay | Admitting: Family Medicine

## 2024-05-27 NOTE — Telephone Encounter (Signed)
 Copied from CRM #[id number]. Topic: Clinical - Medication Prior Auth >> May 27, 2024 12:58 PM Ivette P wrote: Reason for CRM: Pt called in to relay message to provider that medication Zepbound needs authorization to be approved

## 2024-05-28 ENCOUNTER — Telehealth: Payer: Self-pay

## 2024-05-28 DIAGNOSIS — G4733 Obstructive sleep apnea (adult) (pediatric): Secondary | ICD-10-CM | POA: Insufficient documentation

## 2024-05-28 NOTE — Telephone Encounter (Signed)
 Pt has contacted us  needing a PA for his Zepbound. Thank you for your help!  Copied from CRM #[id number]. Topic: Clinical - Medication Prior Auth >> May 27, 2024 12:58 PM Ivette P wrote: Reason for CRM: Pt called in to relay message to provider that medication Zepbound needs authorization to be approved

## 2024-05-31 ENCOUNTER — Other Ambulatory Visit (HOSPITAL_COMMUNITY): Payer: Self-pay

## 2024-05-31 ENCOUNTER — Telehealth: Payer: Self-pay | Admitting: Pharmacy Technician

## 2024-05-31 NOTE — Telephone Encounter (Signed)
 PA request has been Started. New Encounter has been or will be created for follow up. For additional info see Pharmacy Prior Auth telephone encounter from 05/31/24.

## 2024-05-31 NOTE — Telephone Encounter (Signed)
 Pharmacy Patient Advocate Encounter   Received notification from Pt Calls Messages that prior authorization for Zepbound 2.5MG /0.5ML pen-injectors is required/requested.   Insurance verification completed.   The patient is insured through [facility] ADVANTAGE/RX ADVANCE.   Per test claim: PA required; PA submitted to above mentioned insurance via Latent Key/confirmation #/EOC [id number] Status is pending

## 2024-06-01 ENCOUNTER — Other Ambulatory Visit (HOSPITAL_COMMUNITY): Payer: Self-pay

## 2024-06-01 ENCOUNTER — Encounter: Payer: Self-pay | Admitting: Family Medicine

## 2024-06-01 NOTE — Telephone Encounter (Signed)
 Pharmacy Patient Advocate Encounter  Received notification from [facility] ADVANTAGE/RX ADVANCE that Prior Authorization for Zepbound 2.5MG /0.5ML pen-injectors has been DENIED.  Full denial letter will be uploaded to the media tab. See denial reason below.    PA #/Case ID/Reference #: [id number]

## 2024-06-02 ENCOUNTER — Other Ambulatory Visit (HOSPITAL_COMMUNITY): Payer: Self-pay

## 2024-07-02 ENCOUNTER — Telehealth: Payer: Self-pay

## 2024-07-02 NOTE — Telephone Encounter (Signed)
 Appeal sent to [employer] at fax # [phone].mjp,lpn

## 2024-07-12 ENCOUNTER — Encounter: Admitting: Family Medicine

## 2024-07-12 ENCOUNTER — Encounter: Payer: Self-pay | Admitting: Family Medicine

## 2024-07-12 VITALS — BP 160/90 | HR 86 | Temp 97.3°F | Ht 73.0 in | Wt 281.2 lb

## 2024-07-12 DIAGNOSIS — Z125 Encounter for screening for malignant neoplasm of prostate: Secondary | ICD-10-CM

## 2024-07-12 DIAGNOSIS — R030 Elevated blood-pressure reading, without diagnosis of hypertension: Secondary | ICD-10-CM

## 2024-07-12 DIAGNOSIS — Z0000 Encounter for general adult medical examination without abnormal findings: Secondary | ICD-10-CM

## 2024-07-12 DIAGNOSIS — E7800 Pure hypercholesterolemia, unspecified: Secondary | ICD-10-CM

## 2024-07-12 MED ORDER — AMLODIPINE BESYLATE 5 MG PO TABS: 5.0000 mg | ORAL_TABLET | Freq: Every day | ORAL | 3 refills | Status: AC

## 2024-07-12 NOTE — Progress Notes (Signed)
 "   Subjective:    Patient ID: Ryan Turner, male    DOB: 03-Feb-1951, 73 y.o.   MRN: [id number]  HPI  Patient is a 73 year old Caucasian gentleman here today for complete physical exam.  Past medical history is significant for COVID, DVT, a pleural effusion that resulted in an empyema after COVID.  Colonoscopy was performed in 2023.  They recommended a repeat colonoscopy in 2030.  At that time the patient will be 73 years old.  Therefore no further colonoscopy is recommended.  Patient declines the flu shot.  He declines the COVID shot.  He declines the RSV vaccine.  He is due for prostate cancer screening.  I rechecked his blood pressure and found to be 178 systolic.  His diastolic blood pressure the first time was 104.  The second time I checked it it was 94.  However his blood pressure was consistently elevated.  He attributes this to stress.  He denies any chest pain or shortness of breath or dyspnea on exertion. Immunization History  Administered Date(s) Administered   Fluad Quad(high Dose 65+) 04/12/2020   INFLUENZA, HIGH DOSE SEASONAL PF 04/17/2018, 05/19/2019, 05/22/2019   Influenza-Unspecified 03/22/2015, 03/01/2017   Pneumococcal Conjugate-13 01/21/2015   Pneumococcal Polysaccharide-23 07/22/2013   Tdap 10/23/2015   Zoster Recombinant(Shingrix) 05/22/2019, 09/25/2019   Zoster, Live 07/22/2013    Past Medical History:  Diagnosis Date   Cancer (HCC)    SKIN   Cataract 2014   removed   COVID-19    DDD (degenerative disc disease), lumbar    Diverticulosis    Empyema lung (HCC)    PT.DENIES 04/02/22   Headache    IBS (irritable bowel syndrome)    Sleep apnea    ahi 44.3 (2025)   Past Surgical History:  Procedure Laterality Date   cataracts     COLON SURGERY  1995   fistula   COLONOSCOPY     EYE SURGERY  2014   catarats/lens replaced   mohs  2015   skin cancer from nose   PLEURA SAC     DRAINAGE   POLYPECTOMY     VASECTOMY  1985   Current Outpatient Medications  on File Prior to Visit  Medication Sig Dispense Refill   celecoxib  (CELEBREX ) 200 MG capsule Take 1 capsule (200 mg total) by mouth as needed. 30 capsule 0   Glucosamine-Chondroit-Vit C-Mn (GLUCOSAMINE CHONDROITIN COMPLX) CAPS Take 2 capsules by mouth daily. 1800 mg     hyoscyamine  (LEVSIN  SL) 0.125 MG SL tablet PLACE 1 TABLET UNDER THE TONGUE 4 (FOUR) TIMES DAILY AS NEEDED. (Patient not taking: Reported on 04/13/2024) 360 tablet 1   Multiple Vitamins-Minerals (OCUVITE PRESERVISION PO) Take 2 tablets by mouth daily.      Omega-3 Fatty Acids (FISH OIL) 1200 MG CAPS Take 2 capsules by mouth daily.     rosuvastatin  (CRESTOR ) 20 MG tablet TAKE 1 TABLET BY MOUTH EVERY DAY 90 tablet 1   sildenafil  (VIAGRA ) 100 MG tablet Take 0.5-1 tablets (50-100 mg total) by mouth daily as needed for erectile dysfunction. 5 tablet 11   tirzepatide  (ZEPBOUND ) 2.5 MG/0.5ML Pen Inject 2.5 mg into the skin once a week. Increase dose monthly as tolerated 2 mL 1   zolpidem  (AMBIEN ) 10 MG tablet Take 0.5-1 tablets (5-10 mg total) by mouth at bedtime as needed. for sleep 30 tablet 5   No current facility-administered medications on file prior to visit.   Allergies  Allergen Reactions   Morphine      Other reaction(s):  VOMITING   Social History   Socioeconomic History   Marital status: Married    Spouse name: Not on file   Number of children: Not on file   Years of education: College   Highest education level: Bachelor's degree (e.g., BA, AB, BS)  Occupational History   Occupation: Broker  Tobacco Use   Smoking status: Former    Types: Cigars    Quit date: 01/28/2020    Years since quitting: 4.4    Passive exposure: Past   Smokeless tobacco: Never   Tobacco comments:    quit cigarettes in 1973  Vaping Use   Vaping status: Never Used  Substance and Sexual Activity   Alcohol use: Yes    Alcohol/week: 6.0 standard drinks of alcohol    Types: 2 Cans of beer, 4 Shots of liquor per week   Drug use: No   Sexual  activity: Yes    Birth control/protection: None  Other Topics Concern   Not on file  Social History Narrative   Lives at home with wife.   Social Drivers of Health   Tobacco Use: Medium Risk (04/13/2024)   Patient History    Smoking Tobacco Use: Former    Smokeless Tobacco Use: Never    Passive Exposure: Past  Physicist, Medical Strain: Low Risk (07/11/2024)   Overall Financial Resource Strain (CARDIA)    Difficulty of Paying Living Expenses: Not hard at all  Food Insecurity: No Food Insecurity (07/11/2024)   Epic    Worried About Programme Researcher, Broadcasting/film/video in the Last Year: Never true    Ran Out of Food in the Last Year: Never true  Transportation Needs: No Transportation Needs (07/11/2024)   Epic    Lack of Transportation (Medical): No    Lack of Transportation (Non-Medical): No  Physical Activity: Insufficiently Active (07/11/2024)   Exercise Vital Sign    Days of Exercise per Week: 4 days    Minutes of Exercise per Session: 30 min  Stress: No Stress Concern Present (07/11/2024)   [employer] of Occupational Health - Occupational Stress Questionnaire    Feeling of Stress: Only a little  Social Connections: Moderately Integrated (07/11/2024)   Social Connection and Isolation Panel    Frequency of Communication with Friends and Family: Twice a week    Frequency of Social Gatherings with Friends and Family: Once a week    Attends Religious Services: 1 to 4 times per year    Active Member of [employer] or Organizations: No    Attends Banker Meetings: Not on file    Marital Status: Married  Catering Manager Violence: Not At Risk (11/03/2023)   Humiliation, Afraid, Rape, and Kick questionnaire    Fear of Current or Ex-Partner: No    Emotionally Abused: No    Physically Abused: No    Sexually Abused: No  Depression (PHQ2-9): Low Risk (11/03/2023)   Depression (PHQ2-9)    PHQ-2 Score: 0  Alcohol Screen: Low Risk (07/11/2024)   Alcohol Screen    Last Alcohol  Screening Score (AUDIT): 3  Housing: Low Risk (07/11/2024)   Epic    Unable to Pay for Housing in the Last Year: No    Number of Times Moved in the Last Year: 0    Homeless in the Last Year: No  Utilities: Not At Risk (11/03/2023)   AHC Utilities    Threatened with loss of utilities: No  Health Literacy: Adequate Health Literacy (11/03/2023)   B1300 Health Literacy  Frequency of need for help with medical instructions: Never   Family History  Problem Relation Age of Onset   Cancer Mother        lung (unknown primary)   Cancer Father        lung mets to spine   Cancer Brother        melanoma   Leukemia Maternal Grandfather    Stomach cancer Paternal Grandmother    Colon cancer Neg Hx    Colon polyps Neg Hx    Diabetes Neg Hx    Esophageal cancer Neg Hx    Rectal cancer Neg Hx    Crohn's disease Neg Hx    Ulcerative colitis Neg Hx      Review of Systems  All other systems reviewed and are negative.      Objective:   Physical Exam Vitals reviewed.  Constitutional:      General: He is not in acute distress.    Appearance: He is well-developed. He is not diaphoretic.  HENT:     Head: Normocephalic and atraumatic.     Right Ear: External ear normal.     Left Ear: External ear normal.     Nose: Nose normal.     Mouth/Throat:     Pharynx: No oropharyngeal exudate.  Eyes:     General: No scleral icterus.       Right eye: No discharge.        Left eye: No discharge.     Conjunctiva/sclera: Conjunctivae normal.     Pupils: Pupils are equal, round, and reactive to light.  Neck:     Thyroid : No thyromegaly.     Vascular: No JVD.     Trachea: No tracheal deviation.  Cardiovascular:     Rate and Rhythm: Normal rate and regular rhythm.     Heart sounds: Normal heart sounds. No murmur heard.    No friction rub. No gallop.  Pulmonary:     Effort: Pulmonary effort is normal. No respiratory distress.     Breath sounds: Normal breath sounds. No stridor. No wheezing or  rales.  Chest:     Chest wall: No tenderness.  Abdominal:     General: Bowel sounds are normal. There is no distension.     Palpations: Abdomen is soft. There is no mass.     Tenderness: There is no abdominal tenderness. There is no guarding or rebound.  Musculoskeletal:        General: No tenderness. Normal range of motion.     Cervical back: Normal range of motion and neck supple.  Lymphadenopathy:     Cervical: No cervical adenopathy.  Skin:    General: Skin is warm.     Coloration: Skin is not pale.     Findings: No erythema or rash.  Neurological:     Mental Status: He is alert and oriented to person, place, and time.     Cranial Nerves: No cranial nerve deficit.     Motor: No abnormal muscle tone.     Coordination: Coordination normal.     Deep Tendon Reflexes: Reflexes are normal and symmetric.  Psychiatric:        Behavior: Behavior normal.        Thought Content: Thought content normal.        Judgment: Judgment normal.          Assessment & Plan:  Pure hypercholesterolemia - Plan: CBC with Differential/Platelet, Comprehensive metabolic panel with GFR, Lipid panel  Prostate cancer screening -  Plan: PSA  Elevated blood pressure reading  General medical exam Blood pressure is too high today.  Patient believes it is related to stress.  I recommended that we start amlodipine  5 mg a day and then recheck blood pressure in 1 week.  I encouraged him to monitor it closely at home.  I will check a CBC, a CMP, a lipid panel, and a PSA.  Colonoscopy is up-to-date.  Recommended the flu shot, a COVID shot, and the RSV shot which the patient politely declined.  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  Patient states that he is seeing dermatology every 6 months and they plan to treat this in March. "

## 2024-07-13 ENCOUNTER — Ambulatory Visit: Payer: Self-pay | Admitting: Family Medicine

## 2024-07-13 LAB — LIPID PANEL
Cholesterol: 118 mg/dL
HDL: 49 mg/dL
LDL Cholesterol (Calc): 49 mg/dL
Non-HDL Cholesterol (Calc): 69 mg/dL
Total CHOL/HDL Ratio: 2.4 (calc)
Triglycerides: 117 mg/dL

## 2024-07-13 LAB — CBC WITH DIFFERENTIAL/PLATELET
Absolute Lymphocytes: 1363 {cells}/uL (ref 850–3900)
Absolute Monocytes: 395 {cells}/uL (ref 200–950)
Basophils Absolute: 42 {cells}/uL (ref 0–200)
Basophils Relative: 0.9 %
Eosinophils Absolute: 61 {cells}/uL (ref 15–500)
Eosinophils Relative: 1.3 %
HCT: 48.2 % (ref 39.4–51.1)
Hemoglobin: 15.8 g/dL (ref 13.2–17.1)
MCH: 30.7 pg (ref 27.0–33.0)
MCHC: 32.8 g/dL (ref 31.6–35.4)
MCV: 93.8 fL (ref 81.4–101.7)
MPV: 10.1 fL (ref 7.5–12.5)
Monocytes Relative: 8.4 %
Neutro Abs: 2839 {cells}/uL (ref 1500–7800)
Neutrophils Relative %: 60.4 %
Platelets: 164 Thousand/uL (ref 140–400)
RBC: 5.14 Million/uL (ref 4.20–5.80)
RDW: 12.8 % (ref 11.0–15.0)
Total Lymphocyte: 29 %
WBC: 4.7 Thousand/uL (ref 3.8–10.8)

## 2024-07-13 LAB — PSA: PSA: 0.43 ng/mL

## 2024-07-13 LAB — COMPREHENSIVE METABOLIC PANEL WITH GFR
AG Ratio: 1.8 (calc) (ref 1.0–2.5)
ALT: 17 U/L (ref 9–46)
AST: 17 U/L (ref 10–35)
Albumin: 4.4 g/dL (ref 3.6–5.1)
Alkaline phosphatase (APISO): 64 U/L (ref 35–144)
BUN: 22 mg/dL (ref 7–25)
CO2: 25 mmol/L (ref 20–32)
Calcium: 9.2 mg/dL (ref 8.6–10.3)
Chloride: 105 mmol/L (ref 98–110)
Creat: 0.93 mg/dL (ref 0.70–1.28)
Globulin: 2.4 g/dL (ref 1.9–3.7)
Glucose, Bld: 100 mg/dL — ABNORMAL HIGH (ref 65–99)
Potassium: 4.3 mmol/L (ref 3.5–5.3)
Sodium: 139 mmol/L (ref 135–146)
Total Bilirubin: 0.8 mg/dL (ref 0.2–1.2)
Total Protein: 6.8 g/dL (ref 6.1–8.1)
eGFR: 86 mL/min/1.73m2

## 2024-07-16 ENCOUNTER — Other Ambulatory Visit: Payer: Self-pay | Admitting: Family Medicine

## 2024-07-30 ENCOUNTER — Telehealth: Payer: Self-pay | Admitting: Family Medicine

## 2024-07-30 ENCOUNTER — Other Ambulatory Visit: Payer: Self-pay

## 2024-07-30 DIAGNOSIS — E7800 Pure hypercholesterolemia, unspecified: Secondary | ICD-10-CM

## 2024-07-30 MED ORDER — ROSUVASTATIN CALCIUM 20 MG PO TABS: 20.0000 mg | ORAL_TABLET | Freq: Every day | ORAL | 1 refills | Status: AC

## 2024-07-30 NOTE — Telephone Encounter (Signed)
 Prescription Request  07/30/2024  LOV: 07/12/2024  What is the name of the medication or equipment?   rosuvastatin  (CRESTOR ) 20 MG tablet  **90 day script requested**  Have you contacted your pharmacy to request a refill? Yes   Which pharmacy would you like this sent to?  [id number] - [place], [place] - [address] [place] [address] Phone: [phone] Fax: [phone]    Patient notified that their request is being sent to the clinical staff for review and that they should receive a response within 2 business days.   Please advise pharmacist.

## 2024-07-30 NOTE — Telephone Encounter (Signed)
 Sent in medication

## 2024-08-19 ENCOUNTER — Encounter: Payer: Self-pay | Admitting: Family Medicine

## 2024-08-20 ENCOUNTER — Telehealth: Payer: Self-pay

## 2024-08-20 ENCOUNTER — Other Ambulatory Visit: Payer: Self-pay | Admitting: Family Medicine

## 2024-08-20 MED ORDER — WEGOVY 1.5 MG PO TABS: 1.5000 mg | ORAL_TABLET | Freq: Every day | ORAL | 1 refills | Status: AC

## 2024-08-20 NOTE — Telephone Encounter (Signed)
 Copied from CRM [id number]. Topic: General - Other >> Aug 20, 2024  4:30 PM Nathanel BROCKS wrote: Reason for CRM: [employer] [phone] option 2  Need clinical questions answered about wegovy .

## 2024-08-25 DIAGNOSIS — E7800 Pure hypercholesterolemia, unspecified: Secondary | ICD-10-CM | POA: Insufficient documentation

## 2024-10-11 ENCOUNTER — Ambulatory Visit: Payer: Self-pay | Admitting: Psychiatry
